# Patient Record
Sex: Male | Born: 1935 | Race: White | Hispanic: No | Smoking: Never smoker
Health system: Southern US, Community
[De-identification: ages and names within clinical notes are randomized; demographics above are authoritative.]

## PROBLEM LIST (undated history)

## (undated) DIAGNOSIS — I10 Essential (primary) hypertension: Secondary | ICD-10-CM

## (undated) DIAGNOSIS — E785 Hyperlipidemia, unspecified: Secondary | ICD-10-CM

## (undated) DIAGNOSIS — T7840XA Allergy, unspecified, initial encounter: Secondary | ICD-10-CM

## (undated) DIAGNOSIS — E079 Disorder of thyroid, unspecified: Secondary | ICD-10-CM

## (undated) HISTORY — DX: Essential (primary) hypertension: I10

## (undated) HISTORY — DX: Hyperlipidemia, unspecified: E78.5

## (undated) HISTORY — DX: Disorder of thyroid, unspecified: E07.9

## (undated) HISTORY — DX: Allergy, unspecified, initial encounter: T78.40XA

## (undated) HISTORY — PX: INGUINAL HERNIA REPAIR: SUR1180

---

## 1997-12-23 ENCOUNTER — Ambulatory Visit (HOSPITAL_COMMUNITY): Admission: RE | Admit: 1997-12-23 | Discharge: 1997-12-23 | Payer: Self-pay | Admitting: Urology

## 1998-03-26 ENCOUNTER — Encounter: Payer: Self-pay | Admitting: Family Medicine

## 1998-03-26 ENCOUNTER — Ambulatory Visit (HOSPITAL_COMMUNITY): Admission: RE | Admit: 1998-03-26 | Discharge: 1998-03-26 | Payer: Self-pay | Admitting: Family Medicine

## 2000-07-15 ENCOUNTER — Encounter: Payer: Self-pay | Admitting: Internal Medicine

## 2000-07-15 ENCOUNTER — Encounter: Admission: RE | Admit: 2000-07-15 | Discharge: 2000-07-15 | Payer: Self-pay | Admitting: Internal Medicine

## 2003-04-09 ENCOUNTER — Emergency Department (HOSPITAL_COMMUNITY): Admission: EM | Admit: 2003-04-09 | Discharge: 2003-04-09 | Payer: Self-pay | Admitting: Emergency Medicine

## 2003-05-29 ENCOUNTER — Encounter: Admission: RE | Admit: 2003-05-29 | Discharge: 2003-05-29 | Payer: Self-pay | Admitting: Family Medicine

## 2003-07-05 ENCOUNTER — Encounter: Admission: RE | Admit: 2003-07-05 | Discharge: 2003-07-30 | Payer: Self-pay | Admitting: Neurology

## 2003-07-22 ENCOUNTER — Ambulatory Visit (HOSPITAL_COMMUNITY): Admission: RE | Admit: 2003-07-22 | Discharge: 2003-07-22 | Payer: Self-pay | Admitting: Neurology

## 2003-08-20 ENCOUNTER — Encounter: Admission: RE | Admit: 2003-08-20 | Discharge: 2003-11-18 | Payer: Self-pay | Admitting: Neurology

## 2010-06-27 ENCOUNTER — Encounter: Payer: Self-pay | Admitting: Family Medicine

## 2010-06-27 ENCOUNTER — Encounter: Payer: Self-pay | Admitting: Neurology

## 2011-09-01 ENCOUNTER — Other Ambulatory Visit: Payer: Self-pay

## 2011-09-01 MED ORDER — FLUTICASONE PROPIONATE 50 MCG/ACT NA SUSP: 2.0000 | Freq: Every day | NASAL | Status: AC

## 2012-10-22 ENCOUNTER — Other Ambulatory Visit: Payer: Self-pay | Admitting: Internal Medicine

## 2016-06-26 ENCOUNTER — Telehealth: Payer: Self-pay

## 2016-06-26 ENCOUNTER — Ambulatory Visit (INDEPENDENT_AMBULATORY_CARE_PROVIDER_SITE_OTHER): Payer: Medicare Other | Admitting: Family Medicine

## 2016-06-26 VITALS — BP 140/70 | HR 77 | Temp 97.9°F | Resp 18 | Ht 65.5 in | Wt 135.2 lb

## 2016-06-26 DIAGNOSIS — J069 Acute upper respiratory infection, unspecified: Secondary | ICD-10-CM

## 2016-06-26 MED ORDER — AMOXICILLIN 500 MG PO CAPS: 500.0000 mg | ORAL_CAPSULE | Freq: Three times a day (TID) | ORAL | 0 refills | Status: DC

## 2016-06-26 NOTE — Progress Notes (Signed)
   SUBJECTIVE: URI symptoms:  Jeremy Merritt is a 82 y.o. male who complains of URI symptoms present for past week or so.   Describes rhinorrhea, sinus congestion, mild cough.  Has tried OTC decongestants and flonase without relief.  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  No fevers or chills. No nausea or vomiting.  Denies smoking cigarettes.  ROS as above.    PMH reviewed. Patient is a nonsmoker.   Medications reviewed.  Physical Exam:  BP 140/70   Pulse 77   Temp 97.9 F (36.6 C) (Oral)   Resp 18   Ht 5' 5.5" (1.664 m)   Wt 135 lb 4 oz (61.3 kg)   SpO2 99%   BMI 22.16 kg/m  Gen:  Patient sitting on exam table, appears stated age in no acute distress Head: Normocephalic atraumatic Eyes: EOMI, PERRL, sclera and conjunctiva non-erythematous.  Mild arcus senilis noted BL. Ears:  Canals clear bilaterally.  TMs pearly gray bilaterally without erythema or bulging.   Nose:  Nasal turbinates grossly enlarged bilaterally. Some exudates noted. Tender to palpation of ethmoidal sinuses. Mouth: Mucosa membranes moist. Tonsils +2, nonenlarged, non-erythematous. Neck: No cervical lymphadenopathy noted Heart:  RRR, no murmurs auscultated. Pulm:  Clear to auscultation bilaterally with good air movement.  No wheezes or rales noted.    Assessment and Plan:  1.  Sinus infection:  - Likely viral URI based on symptoms.   - delayed use of abx. Amoxicillin has helped him more in past than azithro.  - FU if no improvement in next week

## 2016-06-26 NOTE — Patient Instructions (Addendum)
  You do have a sinus infection.  This is likely caused by a cold that's going around.    You will likely start feeling better in a day or two. However, if you're not feeling better by that point, you can start the Amoxicillin.  I have sent this in for you.  If you're feeling worse, come back and see us.    It was good to meet you today   IF you received an x-ray today, you will receive an invoice from [facility]. Please contact [facility] at [phone] with questions or concerns regarding your invoice.   IF you received labwork today, you will receive an invoice from [facility]. Please contact [facility] at [phone] with questions or concerns regarding your invoice.   Our billing staff will not be able to assist you with questions regarding bills from these companies.  You will be contacted with the lab results as soon as they are available. The fastest way to get your results is to activate your My Chart account. Instructions are located on the last page of this paperwork. If you have not heard from us regarding the results in 2 weeks, please contact this office.

## 2016-07-02 ENCOUNTER — Other Ambulatory Visit: Payer: Self-pay | Admitting: Family Medicine

## 2016-08-21 ENCOUNTER — Ambulatory Visit (INDEPENDENT_AMBULATORY_CARE_PROVIDER_SITE_OTHER): Payer: Medicare Other | Admitting: Family Medicine

## 2016-08-21 VITALS — BP 138/80 | HR 99 | Temp 98.2°F | Resp 14 | Ht 65.5 in | Wt 138.0 lb

## 2016-08-21 DIAGNOSIS — J0100 Acute maxillary sinusitis, unspecified: Secondary | ICD-10-CM

## 2016-08-21 DIAGNOSIS — I10 Essential (primary) hypertension: Secondary | ICD-10-CM

## 2016-08-21 MED ORDER — AZITHROMYCIN 250 MG PO TABS: ORAL_TABLET | ORAL | 0 refills | Status: DC

## 2016-08-21 MED ORDER — IPRATROPIUM BROMIDE 0.03 % NA SOLN: 2.0000 | Freq: Two times a day (BID) | NASAL | 0 refills | Status: DC

## 2016-08-21 MED ORDER — CETIRIZINE HCL 10 MG PO TABS: 5.0000 mg | ORAL_TABLET | Freq: Every day | ORAL | 3 refills | Status: DC

## 2016-08-21 NOTE — Patient Instructions (Addendum)
Start Azithromycin Take 2 tabs x 1 dose, then 1 tab every day for x 4 days.  Take Cetirizine (Zyrtec)  5 mg (break tablet in half) or 10 mg if tolerated nightly at bedtime to prevent persisted congestion and nasal drainage.   Ipratropium (Atrovent) 2 sprays, twice daily as needed for nasal congestion. Once congestion resolves, you can stop using daily.  IF you received an x-ray today, you will receive an invoice from [facility]. Please contact [facility] at [phone] with questions or concerns regarding your invoice.   IF you received labwork today, you will receive an invoice from [place]. Please contact [facility] at [phone] with questions or concerns regarding your invoice.   Our billing staff will not be able to assist you with questions regarding bills from these companies.  You will be contacted with the lab results as soon as they are available. The fastest way to get your results is to activate your My Chart account. Instructions are located on the last page of this paperwork. If you have not heard from [place] regarding the results in 2 weeks, please contact this office.     Sinusitis, Adult Sinusitis is soreness and inflammation of your sinuses. Sinuses are hollow spaces in the bones around your face. They are located:  Around your eyes.  In the middle of your forehead.  Behind your nose.  In your cheekbones. Your sinuses and nasal passages are lined with a stringy fluid (mucus). Mucus normally drains out of your sinuses. When your nasal tissues get inflamed or swollen, the mucus can get trapped or blocked so air cannot flow through your sinuses. This lets bacteria, viruses, and funguses grow, and that leads to infection. Follow these instructions at home: Medicines   Take, use, or apply over-the-counter and prescription medicines only as told by your doctor. These may include nasal sprays.  If you were prescribed an antibiotic medicine, take it as told  by your doctor. Do not stop taking the antibiotic even if you start to feel better. Hydrate and Humidify   Drink enough water to keep your pee (urine) clear or pale yellow.  Use a cool mist humidifier to keep the humidity level in your home above 50%.  Breathe in steam for 10-15 minutes, 3-4 times a day or as told by your doctor. You can do this in the bathroom while a hot shower is running.  Try not to spend time in cool or dry air. Rest   Rest as much as possible.  Sleep with your head raised (elevated).  Make sure to get enough sleep each night. General instructions   Put a warm, moist washcloth on your face 3-4 times a day or as told by your doctor. This will help with discomfort.  Wash your hands often with soap and water. If there is no soap and water, use hand sanitizer.  Do not smoke. Avoid being around people who are smoking (secondhand smoke).  Keep all follow-up visits as told by your doctor. This is important. Contact a doctor if:  You have a fever.  Your symptoms get worse.  Your symptoms do not get better within 10 days. Get help right away if:  You have a very bad headache.  You cannot stop throwing up (vomiting).  You have pain or swelling around your face or eyes.  You have trouble seeing.  You feel confused.  Your neck is stiff.  You have trouble breathing. This information is not intended to replace advice given  to you by your health care provider. Make sure you discuss any questions you have with your health care provider. Document Released: 11/10/2007 Document Revised: 01/18/2016 Document Reviewed: 03/19/2015 Elsevier Interactive Patient Education  2017 [employer]

## 2016-08-21 NOTE — Progress Notes (Signed)
Patient ID: Jeremy Merritt, male    DOB: [birth year], 82 y.o.   MRN: [id number]  PCP: No primary care provider on file.  Chief Complaint  Patient presents with  . URI    3-4 days  . Depression    positive depression screen     Subjective:  HPI Jeremy Merritt, 82 year old male presents for evaluation of upper respiratory infection and positive depression screen during triage. Chronic problems include: hypertension, hypothyroidism, and hyperlipidemia. URI Patient reports 1 week of nasal drainage, coughing and throat discomfort. Over the last 3-4 days, he has developed increased pressure behind both eyes, bilateral ear congestion, and mild headache. He has taken Mucinex and Allegra without relief of symptoms. Reports no measurable fever, although last night he felt feverish.  Depression Patient admits to feeling "down". He reports that his wife of more than 50 years passed away 2 years ago. He reports that his sadness doesn't impact his activities. He works at [employer] and lives with his son. He admits that during winter months he has noticed his sadness is worsened. He denies suicidal thoughts or feelings of hopelessness. He feels that he will never get over the loss of his wife as he reports she was his "best friend".  Social History   Social History  . Marital status: Married    Spouse name: N/A  . Number of children: N/A  . Years of education: N/A   Occupational History  . Not on file.   Social History Main Topics  . Smoking status: Never Smoker  . Smokeless tobacco: Never Used  . Alcohol use No  . Drug use: Unknown  . Sexual activity: Not on file   Other Topics Concern  . Not on file   Social History Narrative  . No narrative on file    No family history on file.   Review of Systems See HPI   Prior to Admission medications   Medication Sig Start Date End Date Taking? Authorizing Provider  aspirin EC 81 MG tablet Take 81 mg by mouth  daily.   Yes Historical Provider, MD  Cholecalciferol (VITAMIN D3) 2000 units TABS Take by mouth daily.   Yes Historical Provider, MD  levothyroxine (SYNTHROID, LEVOTHROID) 100 MCG tablet Take 100 mcg by mouth daily before breakfast.   Yes Historical Provider, MD  lisinopril-hydrochlorothiazide (PRINZIDE,ZESTORETIC) 20-12.5 MG tablet Take 1 tablet by mouth daily.   Yes Historical Provider, MD  simvastatin (ZOCOR) 20 MG tablet Take 20 mg by mouth daily.   Yes Historical Provider, MD    Past Medical, Surgical Family and Social History reviewed and updated.    Objective:   Today's Vitals   08/21/16 1215  BP: 138/80  Pulse: 99  Resp: 14  Temp: 98.2 F (36.8 C)  SpO2: 99%  Weight: 138 lb (62.6 kg)  Height: 5' 5.5" (1.664 m)    Wt Readings from Last 3 Encounters:  08/21/16 138 lb (62.6 kg)  06/26/16 135 lb 4 oz (61.3 kg)   Depression screen [facility] 2/9 08/21/2016 06/26/2016  Decreased Interest 0 0  Down, Depressed, Hopeless 1 0  PHQ - 2 Score 1 0   Physical Exam  Constitutional: He is oriented to person, place, and time. He appears well-developed and well-nourished. He has a sickly appearance.  HENT:  Right Ear: Hearing, tympanic membrane, external ear and ear canal normal.  Left Ear: Hearing, tympanic membrane, external ear and ear canal normal.  Nose: Mucosal edema and rhinorrhea present.  Mouth/Throat:  Uvula is midline. Posterior oropharyngeal erythema present. No oropharyngeal exudate or posterior oropharyngeal edema.  Neurological: He is alert and oriented to person, place, and time.  Skin: Skin is warm and dry.  Psychiatric: He has a normal mood and affect. His behavior is normal. Judgment and thought content normal.     Assessment & Plan:  1. Acute maxillary sinusitis, recurrence not specified 2. Essential Hypertension, Controlled  Plan:  Start Azithromycin Take 2 tabs x 1 dose, then 1 tab every day for x 4 days. Take Cetirizine (Zyrtec)  5 mg (break tablet in half) or 10  mg if tolerated nightly at bedtime to prevent persisted congestion and nasal drainage.  Ipratropium (Atrovent) 2 sprays, twice daily as needed for nasal congestion. Once congestion resolves, you can stop using daily. Continue antihypertensive medication previously prescribed.   Jeremy PickKimberly S. Tiburcio PeaHarris, MSN, FNP-C [facility] at [facility] [phone]

## 2016-10-05 ENCOUNTER — Other Ambulatory Visit: Payer: Self-pay

## 2016-10-05 MED ORDER — CETIRIZINE HCL 10 MG PO TABS: 5.0000 mg | ORAL_TABLET | Freq: Every day | ORAL | 3 refills | Status: DC

## 2016-10-14 ENCOUNTER — Other Ambulatory Visit: Payer: Self-pay | Admitting: Emergency Medicine

## 2016-10-14 MED ORDER — CETIRIZINE HCL 10 MG PO TABS: 5.0000 mg | ORAL_TABLET | Freq: Every day | ORAL | 0 refills | Status: DC

## 2017-01-17 ENCOUNTER — Other Ambulatory Visit: Payer: Self-pay | Admitting: Physician Assistant

## 2017-03-15 ENCOUNTER — Ambulatory Visit (INDEPENDENT_AMBULATORY_CARE_PROVIDER_SITE_OTHER): Payer: Medicare Other | Admitting: Emergency Medicine

## 2017-03-15 ENCOUNTER — Encounter: Payer: Self-pay | Admitting: Emergency Medicine

## 2017-03-15 VITALS — BP 151/75 | HR 87 | Temp 99.4°F | Resp 17 | Ht 66.0 in | Wt 136.0 lb

## 2017-03-15 DIAGNOSIS — R0981 Nasal congestion: Secondary | ICD-10-CM

## 2017-03-15 DIAGNOSIS — J3489 Other specified disorders of nose and nasal sinuses: Secondary | ICD-10-CM

## 2017-03-15 DIAGNOSIS — J0100 Acute maxillary sinusitis, unspecified: Secondary | ICD-10-CM

## 2017-03-15 DIAGNOSIS — J069 Acute upper respiratory infection, unspecified: Secondary | ICD-10-CM

## 2017-03-15 MED ORDER — AZITHROMYCIN 250 MG PO TABS: ORAL_TABLET | ORAL | 0 refills | Status: DC

## 2017-03-15 NOTE — Progress Notes (Signed)
Jeremy Merritt 82 y.o.   Chief Complaint  Patient presents with  . Headache  . URI    HISTORY OF PRESENT ILLNESS: This is a 82 y.o. male complaining of URI symptoms x 1 week.  URI   This is a new problem. The current episode started in the past 7 days. The problem has been gradually worsening. There has been no fever. Associated symptoms include congestion, coughing, headaches and sinus pain. Pertinent negatives include no abdominal pain, chest pain, diarrhea, dysuria, ear pain, joint pain, nausea, rash, rhinorrhea, sore throat, vomiting or wheezing.     Prior to Admission medications   Medication Sig Start Date End Date Taking? Authorizing Provider  aspirin EC 81 MG tablet Take 81 mg by mouth daily.   Yes [provider]  azithromycin (ZITHROMAX) 250 MG tablet Take 2 tabs PO x 1 dose, then 1 tab PO QD x 4 days 08/21/16  Yes Bing Neighbors, FNP  cetirizine (ZYRTEC) 10 MG tablet Take 0.5-1 tablets (5-10 mg total) by mouth at bedtime. May stop taking daily after 6 weeks. 10/14/16  Yes Benjiman Core D, PA-C  Cholecalciferol (VITAMIN D3) 2000 units TABS Take by mouth daily.   Yes [provider]  ipratropium (ATROVENT) 0.03 % nasal spray Place 2 sprays into both nostrils 2 (two) times daily. 08/21/16  Yes Bing Neighbors, FNP  levothyroxine (SYNTHROID, LEVOTHROID) 100 MCG tablet Take 100 mcg by mouth daily before breakfast.   Yes [provider]  lisinopril-hydrochlorothiazide (PRINZIDE,ZESTORETIC) 20-12.5 MG tablet Take 1 tablet by mouth daily.   Yes [provider]  simvastatin (ZOCOR) 20 MG tablet Take 20 mg by mouth daily.   Yes [provider]    No Known Allergies  There are no active problems to display for this patient.   Past Medical History:  Diagnosis Date  . Allergy     No past surgical history on file.  Social History   Social History  . Marital status: Married    Spouse name: N/A  . Number of children:  N/A  . Years of education: N/A   Occupational History  . Not on file.   Social History Main Topics  . Smoking status: Never Smoker  . Smokeless tobacco: Never Used  . Alcohol use No  . Drug use: No  . Sexual activity: No   Other Topics Concern  . Not on file   Social History Narrative  . No narrative on file    No family history on file.   Review of Systems  Constitutional: Negative for chills, fever and malaise/fatigue.  HENT: Positive for congestion and sinus pain. Negative for ear pain, nosebleeds, rhinorrhea and sore throat.   Eyes: Negative.  Negative for blurred vision, double vision, discharge and redness.  Respiratory: Positive for cough and sputum production. Negative for shortness of breath and wheezing.   Cardiovascular: Negative for chest pain and palpitations.  Gastrointestinal: Negative for abdominal pain, diarrhea, nausea and vomiting.  Genitourinary: Negative for dysuria and hematuria.  Musculoskeletal: Negative for joint pain.  Skin: Negative for rash.  Neurological: Positive for headaches.  Endo/Heme/Allergies: Negative.   All other systems reviewed and are negative.  Vitals:   03/15/17 1145  BP: (!) 151/75  Pulse: 87  Resp: 17  Temp: 99.4 F (37.4 C)  SpO2: 98%     Physical Exam  Constitutional: He is oriented to person, place, and time. He appears well-developed and well-nourished.  HENT:  Head: Normocephalic and atraumatic.  Nose:  Nose normal.  Mouth/Throat: Oropharynx is clear and moist.  Eyes: Pupils are equal, round, and reactive to light. Conjunctivae and EOM are normal.  Neck: Normal range of motion. Neck supple. No JVD present. No thyromegaly present.  Cardiovascular: Normal rate, regular rhythm, normal heart sounds and intact distal pulses.   Pulmonary/Chest: Effort normal and breath sounds normal.  Abdominal: Soft. He exhibits no distension. There is no tenderness.  Musculoskeletal: Normal range of motion.  Lymphadenopathy:     He has no cervical adenopathy.  Neurological: He is alert and oriented to person, place, and time.  Skin: Skin is warm and dry. Capillary refill takes less than 2 seconds. No rash noted.  Psychiatric: He has a normal mood and affect. His behavior is normal.  Vitals reviewed.    ASSESSMENT & PLAN: Jeremy Merritt was seen today for headache and uri.  Diagnoses and all orders for this visit:  Acute maxillary sinusitis, recurrence not specified  Acute upper respiratory infection  Sinus congestion  Sinus pressure  Other orders -     azithromycin (ZITHROMAX) 250 MG tablet; Sig as indicated    Patient Instructions       IF you received an x-ray today, you will receive an invoice from [facility]. Please contact [facility] at [phone] with questions or concerns regarding your invoice.   IF you received labwork today, you will receive an invoice from [place]. Please contact [facility] at [phone] with questions or concerns regarding your invoice.   Our billing staff will not be able to assist you with questions regarding bills from these companies.  You will be contacted with the lab results as soon as they are available. The fastest way to get your results is to activate your My Chart account. Instructions are located on the last page of this paperwork. If you have not heard from [place] regarding the results in 2 weeks, please contact this office.      Upper Respiratory Infection, Adult Most upper respiratory infections (URIs) are caused by a virus. A URI affects the nose, throat, and upper air passages. The most common type of URI is often called "the common cold." Follow these instructions at home:  Take medicines only as told by your doctor.  Gargle warm saltwater or take cough drops to comfort your throat as told by your doctor.  Use a warm mist humidifier or inhale steam from a shower to increase air moisture. This may make it easier to breathe.  Drink  enough fluid to keep your pee (urine) clear or pale yellow.  Eat soups and other clear broths.  Have a healthy diet.  Rest as needed.  Go back to work when your fever is gone or your doctor says it is okay. ? You may need to stay home longer to avoid giving your URI to others. ? You can also wear a face mask and wash your hands often to prevent spread of the virus.  Use your inhaler more if you have asthma.  Do not use any tobacco products, including cigarettes, chewing tobacco, or electronic cigarettes. If you need help quitting, ask your doctor. Contact a doctor if:  You are getting worse, not better.  Your symptoms are not helped by medicine.  You have chills.  You are getting more short of breath.  You have brown or red mucus.  You have yellow or brown discharge from your nose.  You have pain in your face, especially when you bend forward.  You have a fever.  You have puffy (swollen) neck glands.  You have pain while swallowing.  You have white areas in the back of your throat. Get help right away if:  You have very bad or constant: ? Headache. ? Ear pain. ? Pain in your forehead, behind your eyes, and over your cheekbones (sinus pain). ? Chest pain.  You have long-lasting (chronic) lung disease and any of the following: ? Wheezing. ? Long-lasting cough. ? Coughing up blood. ? A change in your usual mucus.  You have a stiff neck.  You have changes in your: ? Vision. ? Hearing. ? Thinking. ? Mood. This information is not intended to replace advice given to you by your health care provider. Make sure you discuss any questions you have with your health care provider. Document Released: 11/10/2007 Document Revised: 01/25/2016 Document Reviewed: 08/29/2013 Elsevier Interactive Patient Education  2018 Elsevier Inc.      Agustina Caroli, MD [facility]

## 2017-03-15 NOTE — Patient Instructions (Addendum)
     IF you received an x-ray today, you will receive an invoice from [facility]. Please contact [facility] at [phone] with questions or concerns regarding your invoice.   IF you received labwork today, you will receive an invoice from [facility]. Please contact [facility] at [phone] with questions or concerns regarding your invoice.   Our billing staff will not be able to assist you with questions regarding bills from these companies.  You will be contacted with the lab results as soon as they are available. The fastest way to get your results is to activate your My Chart account. Instructions are located on the last page of this paperwork. If you have not heard from us regarding the results in 2 weeks, please contact this office.     Upper Respiratory Infection, Adult Most upper respiratory infections (URIs) are caused by a virus. A URI affects the nose, throat, and upper air passages. The most common type of URI is often called "the common cold." Follow these instructions at home:  Take medicines only as told by your doctor.  Gargle warm saltwater or take cough drops to comfort your throat as told by your doctor.  Use a warm mist humidifier or inhale steam from a shower to increase air moisture. This may make it easier to breathe.  Drink enough fluid to keep your pee (urine) clear or pale yellow.  Eat soups and other clear broths.  Have a healthy diet.  Rest as needed.  Go back to work when your fever is gone or your doctor says it is okay. ? You may need to stay home longer to avoid giving your URI to others. ? You can also wear a face mask and wash your hands often to prevent spread of the virus.  Use your inhaler more if you have asthma.  Do not use any tobacco products, including cigarettes, chewing tobacco, or electronic cigarettes. If you need help quitting, ask your doctor. Contact a doctor if:  You are getting worse, not better.  Your  symptoms are not helped by medicine.  You have chills.  You are getting more short of breath.  You have brown or red mucus.  You have yellow or brown discharge from your nose.  You have pain in your face, especially when you bend forward.  You have a fever.  You have puffy (swollen) neck glands.  You have pain while swallowing.  You have white areas in the back of your throat. Get help right away if:  You have very bad or constant: ? Headache. ? Ear pain. ? Pain in your forehead, behind your eyes, and over your cheekbones (sinus pain). ? Chest pain.  You have long-lasting (chronic) lung disease and any of the following: ? Wheezing. ? Long-lasting cough. ? Coughing up blood. ? A change in your usual mucus.  You have a stiff neck.  You have changes in your: ? Vision. ? Hearing. ? Thinking. ? Mood. This information is not intended to replace advice given to you by your health care provider. Make sure you discuss any questions you have with your health care provider. Document Released: 11/10/2007 Document Revised: 01/25/2016 Document Reviewed: 08/29/2013 Elsevier Interactive Patient Education  2018 [employer] Inc.  

## 2017-10-04 ENCOUNTER — Ambulatory Visit: Payer: Medicare Other | Admitting: Emergency Medicine

## 2017-10-05 ENCOUNTER — Ambulatory Visit: Payer: Medicare Other | Admitting: Physician Assistant

## 2018-06-15 ENCOUNTER — Encounter: Payer: Self-pay | Admitting: Emergency Medicine

## 2018-06-15 ENCOUNTER — Ambulatory Visit: Payer: Medicare Other

## 2018-06-15 ENCOUNTER — Ambulatory Visit (INDEPENDENT_AMBULATORY_CARE_PROVIDER_SITE_OTHER): Payer: Medicare Other | Admitting: Emergency Medicine

## 2018-06-15 ENCOUNTER — Other Ambulatory Visit: Payer: Self-pay

## 2018-06-15 ENCOUNTER — Ambulatory Visit (INDEPENDENT_AMBULATORY_CARE_PROVIDER_SITE_OTHER): Payer: Medicare Other

## 2018-06-15 VITALS — BP 115/70 | HR 85 | Temp 98.6°F | Resp 16 | Ht 65.0 in | Wt 120.0 lb

## 2018-06-15 DIAGNOSIS — R6881 Early satiety: Secondary | ICD-10-CM

## 2018-06-15 DIAGNOSIS — R198 Other specified symptoms and signs involving the digestive system and abdomen: Secondary | ICD-10-CM

## 2018-06-15 DIAGNOSIS — R1010 Upper abdominal pain, unspecified: Secondary | ICD-10-CM

## 2018-06-15 DIAGNOSIS — K5900 Constipation, unspecified: Secondary | ICD-10-CM | POA: Insufficient documentation

## 2018-06-15 LAB — POCT URINALYSIS DIP (MANUAL ENTRY)
Bilirubin, UA: NEGATIVE
Blood, UA: NEGATIVE
Glucose, UA: NEGATIVE mg/dL
Ketones, POC UA: NEGATIVE mg/dL
Leukocytes, UA: NEGATIVE
Nitrite, UA: NEGATIVE
Protein Ur, POC: NEGATIVE mg/dL
Spec Grav, UA: 1.02 (ref 1.010–1.025)
Urobilinogen, UA: 0.2 E.U./dL
pH, UA: 6 (ref 5.0–8.0)

## 2018-06-15 LAB — POCT CBC
Granulocyte percent: 62.2 %G (ref 37–80)
HCT, POC: 41.1 % — AB (ref 29–41)
Hemoglobin: 14.4 g/dL (ref 11–14.6)
Lymph, poc: 2.2 (ref 0.6–3.4)
MCH, POC: 31.1 pg (ref 27–31.2)
MCHC: 34.9 g/dL (ref 31.8–35.4)
MCV: 89 fL (ref 76–111)
MID (cbc): 0.5 (ref 0–0.9)
MPV: 6.1 fL (ref 0–99.8)
POC Granulocyte: 4.5 (ref 2–6.9)
POC LYMPH PERCENT: 30.4 %L (ref 10–50)
POC MID %: 7.4 %M (ref 0–12)
Platelet Count, POC: 286 10*3/uL (ref 142–424)
RBC: 4.62 M/uL — AB (ref 4.69–6.13)
RDW, POC: 12.8 %
WBC: 7.3 10*3/uL (ref 4.6–10.2)

## 2018-06-15 MED ORDER — OMEPRAZOLE 40 MG PO CPDR: 40.0000 mg | DELAYED_RELEASE_CAPSULE | Freq: Every day | ORAL | 3 refills | Status: DC

## 2018-06-15 NOTE — Patient Instructions (Addendum)
     If you have lab work done today you will be contacted with your lab results within the next 2 weeks.  If you have not heard from us then please contact us. The fastest way to get your results is to register for My Chart.   IF you received an x-ray today, you will receive an invoice from [facility]. Please contact [facility] at [phone] with questions or concerns regarding your invoice.   IF you received labwork today, you will receive an invoice from [facility]. Please contact [facility] at [phone] with questions or concerns regarding your invoice.   Our billing staff will not be able to assist you with questions regarding bills from these companies.  You will be contacted with the lab results as soon as they are available. The fastest way to get your results is to activate your My Chart account. Instructions are located on the last page of this paperwork. If you have not heard from us regarding the results in 2 weeks, please contact this office.      Abdominal Pain, Adult  Many things can cause belly (abdominal) pain. Most times, belly pain is not dangerous. Many cases of belly pain can be watched and treated at home. Sometimes belly pain is serious, though. Your doctor will try to find the cause of your belly pain. Follow these instructions at home:  Take over-the-counter and prescription medicines only as told by your doctor. Do not take medicines that help you poop (laxatives) unless told to by your doctor.  Drink enough fluid to keep your pee (urine) clear or pale yellow.  Watch your belly pain for any changes.  Keep all follow-up visits as told by your doctor. This is important. Contact a doctor if:  Your belly pain changes or gets worse.  You are not hungry, or you lose weight without trying.  You are having trouble pooping (constipated) or have watery poop (diarrhea) for more than 2-3 days.  You have pain when you pee or poop.  Your belly  pain wakes you up at night.  Your pain gets worse with meals, after eating, or with certain foods.  You are throwing up and cannot keep anything down.  You have a fever. Get help right away if:  Your pain does not go away as soon as your doctor says it should.  You cannot stop throwing up.  Your pain is only in areas of your belly, such as the right side or the left lower part of the belly.  You have bloody or black poop, or poop that looks like tar.  You have very bad pain, cramping, or bloating in your belly.  You have signs of not having enough fluid or water in your body (dehydration), such as: ? Dark pee, very little pee, or no pee. ? Cracked lips. ? Dry mouth. ? Sunken eyes. ? Sleepiness. ? Weakness. This information is not intended to replace advice given to you by your health care provider. Make sure you discuss any questions you have with your health care provider. Document Released: 11/10/2007 Document Revised: 12/12/2015 Document Reviewed: 11/05/2015 Elsevier Interactive Patient Education  2019 Elsevier Inc.  

## 2018-06-15 NOTE — Progress Notes (Signed)
Jeremy Merritt 84 y.o.   Chief Complaint  Patient presents with  . Abdominal Pain    with constipation x 1 month unable to eat - per patient off/on start in the evening    HISTORY OF PRESENT ILLNESS: This is a 84 y.o. male complaining of upper abdominal fullness sensation mostly in the evening for the past 2 to 3 months along with constipation for the past month.  At times it feels like an "irritation" in the stomach, relieved by Tums.  Denies nausea or vomiting.  Denies melena or rectal bleeding.  Denies any new medication or changes in his regular diet.  HPI   Prior to Admission medications   Medication Sig Start Date End Date Taking? Authorizing Provider  aspirin EC 81 MG tablet Take 81 mg by mouth daily.   Yes [provider]  cetirizine (ZYRTEC) 10 MG tablet Take 0.5-1 tablets (5-10 mg total) by mouth at bedtime. May stop taking daily after 6 weeks. 10/14/16  Yes Benjiman Core D, PA-C  Cholecalciferol (VITAMIN D3) 2000 units TABS Take by mouth daily.   Yes [provider]  levothyroxine (SYNTHROID, LEVOTHROID) 100 MCG tablet Take 100 mcg by mouth daily before breakfast.   Yes [provider]  lisinopril-hydrochlorothiazide (PRINZIDE,ZESTORETIC) 20-12.5 MG tablet Take 1 tablet by mouth daily.   Yes [provider]  simvastatin (ZOCOR) 20 MG tablet Take 20 mg by mouth daily.   Yes [provider]  ipratropium (ATROVENT) 0.03 % nasal spray Place 2 sprays into both nostrils 2 (two) times daily. Patient not taking: Reported on 06/15/2018 08/21/16   Bing Neighbors, FNP    No Known Allergies  There are no active problems to display for this patient.   Past Medical History:  Diagnosis Date  . Allergy     No past surgical history on file.  Social History   Socioeconomic History  . Marital status: Married    Spouse name: Not on file  . Number of children: Not on file  . Years of education: Not on file  . Highest  education level: Not on file  Occupational History  . Not on file  Social Needs  . Financial resource strain: Not on file  . Food insecurity:    Worry: Not on file    Inability: Not on file  . Transportation needs:    Medical: Not on file    Non-medical: Not on file  Tobacco Use  . Smoking status: Never Smoker  . Smokeless tobacco: Never Used  Substance and Sexual Activity  . Alcohol use: No  . Drug use: No  . Sexual activity: Never  Lifestyle  . Physical activity:    Days per week: Not on file    Minutes per session: Not on file  . Stress: Not on file  Relationships  . Social connections:    Talks on phone: Not on file    Gets together: Not on file    Attends religious service: Not on file    Active member of club or organization: Not on file    Attends meetings of clubs or organizations: Not on file    Relationship status: Not on file  . Intimate partner violence:    Fear of current or ex partner: Not on file    Emotionally abused: Not on file    Physically abused: Not on file    Forced sexual activity: Not on file  Other Topics Concern  . Not on file  Social  History Narrative  . Not on file    No family history on file.   Review of Systems  Constitutional: Negative.  Negative for chills and fever.  HENT: Negative.   Eyes: Negative.   Respiratory: Negative.  Negative for cough and shortness of breath.   Cardiovascular: Negative.  Negative for chest pain and palpitations.  Gastrointestinal: Positive for abdominal pain, constipation and heartburn. Negative for blood in stool, diarrhea, melena, nausea and vomiting.  Genitourinary: Negative.   Skin: Negative.  Negative for rash.  Neurological: Negative.  Negative for dizziness and headaches.  Endo/Heme/Allergies: Negative.   All other systems reviewed and are negative.   Vitals:   06/15/18 1146  BP: 115/70  Pulse: 85  Resp: 16  Temp: 98.6 F (37 C)  SpO2: 100%    Physical Exam Vitals signs reviewed.   Constitutional:      Appearance: He is well-developed.  HENT:     Head: Normocephalic and atraumatic.     Nose: Nose normal.     Mouth/Throat:     Mouth: Mucous membranes are moist.     Pharynx: Oropharynx is clear.  Eyes:     Extraocular Movements: Extraocular movements intact.     Conjunctiva/sclera: Conjunctivae normal.     Pupils: Pupils are equal, round, and reactive to light.  Neck:     Musculoskeletal: Normal range of motion.  Cardiovascular:     Rate and Rhythm: Normal rate and regular rhythm.     Pulses: Normal pulses.     Heart sounds: Normal heart sounds.  Pulmonary:     Effort: Pulmonary effort is normal.     Breath sounds: Normal breath sounds.  Abdominal:     General: Abdomen is flat. Bowel sounds are normal. There is no distension.     Palpations: Abdomen is soft. There is no mass.     Tenderness: There is no abdominal tenderness.  Musculoskeletal: Normal range of motion.  Skin:    General: Skin is warm and dry.     Capillary Refill: Capillary refill takes less than 2 seconds.  Neurological:     General: No focal deficit present.     Mental Status: He is alert and oriented to person, place, and time.  Psychiatric:        Mood and Affect: Mood normal.        Behavior: Behavior normal.    Results for orders placed or performed in visit on 06/15/18 (from the past 24 hour(s))  POCT urinalysis dipstick     Status: None   Collection Time: 06/15/18 12:24 PM  Result Value Ref Range   Color, UA yellow yellow   Clarity, UA clear clear   Glucose, UA negative negative mg/dL   Bilirubin, UA negative negative   Ketones, POC UA negative negative mg/dL   Spec Grav, UA 9.147 8.295 - 1.025   Blood, UA negative negative   pH, UA 6.0 5.0 - 8.0   Protein Ur, POC negative negative mg/dL   Urobilinogen, UA 0.2 0.2 or 1.0 E.U./dL   Nitrite, UA Negative Negative   Leukocytes, UA Negative Negative  POCT CBC     Status: Abnormal   Collection Time: 06/15/18 12:29 PM    Result Value Ref Range   WBC 7.3 4.6 - 10.2 K/uL   Lymph, poc 2.2 0.6 - 3.4   POC LYMPH PERCENT 30.4 10 - 50 %L   MID (cbc) 0.5 0 - 0.9   POC MID % 7.4 0 - 12 %M  POC Granulocyte 4.5 2 - 6.9   Granulocyte percent 62.2 37 - 80 %G   RBC 4.62 (A) 4.69 - 6.13 M/uL   Hemoglobin 14.4 11 - 14.6 g/dL   HCT, POC 50.3 (A) 29 - 41 %   MCV 89.0 76 - 111 fL   MCH, POC 31.1 27 - 31.2 pg   MCHC 34.9 31.8 - 35.4 g/dL   RDW, POC 88.8 %   Platelet Count, POC 286 142 - 424 K/uL   MPV 6.1 0 - 99.8 fL   Dg Chest 1 View  Result Date: 06/15/2018 CLINICAL DATA:  Upper abdominal pain EXAM: CHEST  1 VIEW COMPARISON:  None. FINDINGS: The cardiopericardial silhouette is within normal limits. There is moderate aortic atherosclerosis at the arch without aneurysmal dilatation. Scattered subpleural 2-3 mm nodular opacities are noted in both lung apices and right mid lung, nonspecific but may be postinfectious or inflammatory in etiology. Pulmonary nodules are not entirely excluded. Indeterminate right basilar nodule measuring 5 mm may represent the right nipple shadow. No acute pulmonary consolidation, effusion or pneumothorax. No free air beneath the diaphragm. The visualized skeletal structures are unremarkable. IMPRESSION: 1. 2-3 mm subpleural nodular opacities are noted in both lung apices and right mid lung, nonspecific but may be postinfectious or inflammatory in etiology. Pulmonary nodules are not entirely excluded. Consider baseline chest CT for further correlation. 2. Aortic atherosclerosis. Electronically Signed   By: Tollie Eth M.D.   On: 06/15/2018 14:04   Dg Abd 2 Views  Result Date: 06/15/2018 CLINICAL DATA:  Upper abdominal pain EXAM: ABDOMEN - 2 VIEW COMPARISON:  None. FINDINGS: Supine and erect views of the abdomen show no bowel obstruction. No free air is seen. There is a moderate amount of feces throughout the colon. No definite renal or ureteral calculi are noted. The bones are unremarkable.  IMPRESSION: 1. Moderate amount of feces throughout the colon. No bowel obstruction. 2. No opaque calculi. 3. No renal or ureteral calculi are noted. Electronically Signed   By: Dwyane Dee M.D.   On: 06/15/2018 13:59     ASSESSMENT & PLAN: Jesstin was seen today for abdominal pain.  Diagnoses and all orders for this visit:  Upper abdominal pain -     POCT urinalysis dipstick -     Comprehensive metabolic panel -     POCT CBC -     Cancel: DG ACUTE ABD 2+V ABD (SUPINE,ERECT,DECUB) + 1V CHEST; Future -     Lipase -     Cancel: DG Abd Acute W/Chest; Future -     Ambulatory referral to Gastroenterology -     Cancel: DG Abd Acute W/Chest; Future -     Cancel: DG Abd Acute W/Chest; Future -     Cancel: DG Abd Acute W/Chest; Future -     Cancel: DG Abd 2 Views; Future -     Cancel: DG Chest 1 View; Future -     DG Abd 2 Views; Future -     DG Chest 1 View; Future -     omeprazole (PRILOSEC) 40 MG capsule; Take 1 capsule (40 mg total) by mouth daily.  Early satiety -     Ambulatory referral to Gastroenterology  Abdominal fullness -     Ambulatory referral to Gastroenterology  Constipation, unspecified constipation type -     Cancel: DG Abd 2 Views; Future    Patient Instructions       If you have lab work done today you will  be contacted with your lab results within the next 2 weeks.  If you have not heard from us then please contact us. The fastest way to get your results is to register for My Chart.   IF you received an x-ray today, you will receive an invoice from [facility]. Please contact [facility] at [phone] with questions or concerns regarding your invoice.   IF you received labwork today, you will receive an invoice from [facility]. Please contact [facility] at [phone] with questions or concerns regarding your invoice.   Our billing staff will not be able to assist you with questions regarding bills from these companies.  You will be  contacted with the lab results as soon as they are available. The fastest way to get your results is to activate your My Chart account. Instructions are located on the last page of this paperwork. If you have not heard from us regarding the results in 2 weeks, please contact this office.     Abdominal Pain, Adult  Many things can cause belly (abdominal) pain. Most times, belly pain is not dangerous. Many cases of belly pain can be watched and treated at home. Sometimes belly pain is serious, though. Your doctor will try to find the cause of your belly pain. Follow these instructions at home:  Take over-the-counter and prescription medicines only as told by your doctor. Do not take medicines that help you poop (laxatives) unless told to by your doctor.  Drink enough fluid to keep your pee (urine) clear or pale yellow.  Watch your belly pain for any changes.  Keep all follow-up visits as told by your doctor. This is important. Contact a doctor if:  Your belly pain changes or gets worse.  You are not hungry, or you lose weight without trying.  You are having trouble pooping (constipated) or have watery poop (diarrhea) for more than 2-3 days.  You have pain when you pee or poop.  Your belly pain wakes you up at night.  Your pain gets worse with meals, after eating, or with certain foods.  You are throwing up and cannot keep anything down.  You have a fever. Get help right away if:  Your pain does not go away as soon as your doctor says it should.  You cannot stop throwing up.  Your pain is only in areas of your belly, such as the right side or the left lower part of the belly.  You have bloody or black poop, or poop that looks like tar.  You have very bad pain, cramping, or bloating in your belly.  You have signs of not having enough fluid or water in your body (dehydration), such as: ? Dark pee, very little pee, or no pee. ? Cracked lips. ? Dry mouth. ? Sunken  eyes. ? Sleepiness. ? Weakness. This information is not intended to replace advice given to you by your health care provider. Make sure you discuss any questions you have with your health care provider. Document Released: 11/10/2007 Document Revised: 12/12/2015 Document Reviewed: 11/05/2015 Elsevier Interactive Patient Education  2019 Elsevier Inc.      Edwina BarthMiguel Latacha Texeira, MD [facility]

## 2018-06-16 LAB — COMPREHENSIVE METABOLIC PANEL
ALT: 12 IU/L (ref 0–44)
AST: 13 IU/L (ref 0–40)
Albumin/Globulin Ratio: 2 (ref 1.2–2.2)
Albumin: 4.5 g/dL (ref 3.5–4.7)
Alkaline Phosphatase: 97 IU/L (ref 39–117)
BUN/Creatinine Ratio: 12 (ref 10–24)
BUN: 15 mg/dL (ref 8–27)
Bilirubin Total: 0.4 mg/dL (ref 0.0–1.2)
CO2: 26 mmol/L (ref 20–29)
Calcium: 9.4 mg/dL (ref 8.6–10.2)
Chloride: 101 mmol/L (ref 96–106)
Creatinine, Ser: 1.21 mg/dL (ref 0.76–1.27)
GFR calc Af Amer: 65 mL/min/{1.73_m2} (ref >59)
GFR calc non Af Amer: 56 mL/min/{1.73_m2} — ABNORMAL LOW (ref >59)
Globulin, Total: 2.2 g/dL (ref 1.5–4.5)
Glucose: 90 mg/dL (ref 65–99)
Potassium: 4.4 mmol/L (ref 3.5–5.2)
Sodium: 142 mmol/L (ref 134–144)
Total Protein: 6.7 g/dL (ref 6.0–8.5)

## 2018-06-16 LAB — LIPASE: Lipase: 20 U/L (ref 13–78)

## 2018-06-26 ENCOUNTER — Encounter: Payer: Self-pay | Admitting: Gastroenterology

## 2018-06-28 ENCOUNTER — Telehealth: Payer: Self-pay | Admitting: Emergency Medicine

## 2018-06-28 NOTE — Telephone Encounter (Signed)
Copied from CRM [id number]. Topic: General - Inquiry >> Jun 28, 2018  8:41 AM Maia Petties wrote: Reason for CRM: pt called to f/u on xray results. He received a letter with lab information but nothing about xrays. Please call to advise.

## 2018-06-28 NOTE — Telephone Encounter (Signed)
Please advise 

## 2018-06-28 NOTE — Telephone Encounter (Signed)
Unremarkable x-rays.  Thanks.

## 2018-06-28 NOTE — Telephone Encounter (Signed)
Spoke to pt about about xray and informed him of labs.

## 2018-06-28 NOTE — Telephone Encounter (Signed)
LVM for pt to call office back about labs. 

## 2018-07-17 ENCOUNTER — Encounter: Payer: Self-pay | Admitting: Gastroenterology

## 2018-07-17 ENCOUNTER — Ambulatory Visit: Payer: Medicare Other | Admitting: Gastroenterology

## 2018-07-17 ENCOUNTER — Other Ambulatory Visit (INDEPENDENT_AMBULATORY_CARE_PROVIDER_SITE_OTHER): Payer: Medicare Other

## 2018-07-17 VITALS — BP 138/64 | HR 96 | Ht 65.5 in | Wt 124.0 lb

## 2018-07-17 DIAGNOSIS — R933 Abnormal findings on diagnostic imaging of other parts of digestive tract: Secondary | ICD-10-CM

## 2018-07-17 DIAGNOSIS — R935 Abnormal findings on diagnostic imaging of other abdominal regions, including retroperitoneum: Secondary | ICD-10-CM

## 2018-07-17 DIAGNOSIS — R140 Abdominal distension (gaseous): Secondary | ICD-10-CM | POA: Diagnosis not present

## 2018-07-17 LAB — BASIC METABOLIC PANEL
BUN: 16 mg/dL (ref 6–23)
CO2: 30 mEq/L (ref 19–32)
Calcium: 9.3 mg/dL (ref 8.4–10.5)
Chloride: 103 mEq/L (ref 96–112)
Creatinine, Ser: 1.14 mg/dL (ref 0.40–1.50)
GFR: 61.6 mL/min (ref >60.00)
Glucose, Bld: 90 mg/dL (ref 70–99)
Potassium: 3.7 mEq/L (ref 3.5–5.1)
Sodium: 141 mEq/L (ref 135–145)

## 2018-07-17 LAB — TSH: TSH: 2.55 u[IU]/mL (ref 0.35–4.50)

## 2018-07-17 NOTE — Progress Notes (Signed)
Referring Provider: Georgina QuintSagardia, Miguel Jose, * Primary Care Physician:  Patient, No Pcp Per   Reason for Consultation: Abdominal bloating   IMPRESSION:  Upper abdominal pain and bloating  Recent unintentional weight loss Moderate amount of stool in the colon on abdominal films 06/15/18  Patient declines any endoscopic evaluation.  Therefore, we will proceed with cross-sectional imaging as a starting point to evaluate his symptoms.  PLAN: Addition of Miralax daily to see if symptoms improve with control of intermittent constipation Continue omeprazole 40 mg daily CT abd/pelvis with contrast BMP, TSH Return to clinic after the CT scan  HPI: Hyman BowerKernsie M Corrow is a 84 y.o. male seen in consultation at the request of Dr. Alvy BimlerSagardia.  The history is obtained through the patient and review of his electronic health record.  He notes [facility].  Abdominal discomfort x 3-4 months. Some bubbling; some diffuse, non-radiating pain; some urgency with defecation even when he doesn't need to defecate. Associated nausea.  Feels AN "irritation" in the stomach no distension. +Flatus. No eructation.  Worse at night.  No heartburn. Intermittent diarrhea. No constipation recently, but prior history of constipation.  Some improvement with Tums as needed.  No improvement on Benefiber daily for several weeks.   No change in symptoms with defecation or eating of the time.  May be intermittent relief with defecation.  At least one BM daily but there is a sense of incomplete evacuation.   Originally lost some weight but he has regained it.  Had anorexia, now improved. Believes the protonix may be helping.  Good energy levels. Supplementing with boost over the last month Started Vitamin D replacements.  No other associated symptoms. No identified exacerbating or relieving features.   Some stress. Wife died 05/2014. Son stays with him. Daughter lives in [place].   Labs 06/15/18: normal  CMP. WBC 7.3, hgb 14.4.  Lipase was 20. Abdominal films 06/15/2018 showed a moderate amount of stool throughout the colon  No prior colonoscopy or endoscopy. No known family history of colon cancer or polyps.   Past Medical History:  Diagnosis Date  . Allergy   . Hyperlipidemia   . Hypertension   . Thyroid disease     Past Surgical History:  Procedure Laterality Date  . INGUINAL HERNIA REPAIR      Current Outpatient Medications  Medication Sig Dispense Refill  . aspirin EC 81 MG tablet Take 81 mg by mouth daily.    . cetirizine (ZYRTEC) 10 MG tablet Take 0.5-1 tablets (5-10 mg total) by mouth at bedtime. May stop taking daily after 6 weeks. 90 tablet 0  . Cholecalciferol (VITAMIN D3) 2000 units TABS Take by mouth daily.    Marland Kitchen. levothyroxine (SYNTHROID, LEVOTHROID) 100 MCG tablet Take 100 mcg by mouth daily before breakfast.    . lisinopril-hydrochlorothiazide (PRINZIDE,ZESTORETIC) 20-12.5 MG tablet Take 1 tablet by mouth daily.    Marland Kitchen. omeprazole (PRILOSEC) 40 MG capsule Take 1 capsule (40 mg total) by mouth daily. 30 capsule 3  . simvastatin (ZOCOR) 20 MG tablet Take 20 mg by mouth daily.     No current facility-administered medications for this visit.     Allergies as of 07/17/2018  . (No Known Allergies)    Family History  Problem Relation Age of Onset  . Other Mother        died of old age  . Other Father        Black lung  . Thyroid cancer Sister   .  Kidney failure Brother        on dialysis  . Colon cancer Neg Hx   . Liver cancer Neg Hx   . Stomach cancer Neg Hx     Social History   Socioeconomic History  . Marital status: Widowed    Spouse name: Not on file  . Number of children: 2  . Years of education: Not on file  . Highest education level: Not on file  Occupational History  . Occupation: retired  Engineer, productionocial Needs  . Financial resource strain: Not on file  . Food insecurity:    Worry: Not on file    Inability: Not on file  . Transportation needs:     Medical: Not on file    Non-medical: Not on file  Tobacco Use  . Smoking status: Never Smoker  . Smokeless tobacco: Never Used  Substance and Sexual Activity  . Alcohol use: No  . Drug use: No  . Sexual activity: Never  Lifestyle  . Physical activity:    Days per week: Not on file    Minutes per session: Not on file  . Stress: Not on file  Relationships  . Social connections:    Talks on phone: Not on file    Gets together: Not on file    Attends religious service: Not on file    Active member of club or organization: Not on file    Attends meetings of clubs or organizations: Not on file    Relationship status: Not on file  . Intimate partner violence:    Fear of current or ex partner: Not on file    Emotionally abused: Not on file    Physically abused: Not on file    Forced sexual activity: Not on file  Other Topics Concern  . Not on file  Social History Narrative  . Not on file    Review of Systems: 12 system ROS is negative except as noted above.  Filed Weights   07/17/18 1418  Weight: 124 lb (56.2 kg)    Physical Exam: Vital signs were reviewed. General:   Alert, well-nourished, pleasant and cooperative in NAD. Bilateral temporal wasting.  Head:  Normocephalic and atraumatic. Eyes:  Sclera clear, no icterus.   Conjunctiva pink. Mouth:  No deformity or lesions.   Neck:  Supple; no thyromegaly. Lungs:  Clear throughout to auscultation.   No wheezes.  Heart:  Regular rate and rhythm; no murmurs Abdomen:  Soft, nontender, normal bowel sounds. No rebound or guarding. No hepatosplenomegaly Rectal:  Deferred  Msk:  Symmetrical without gross deformities. Extremities:  No gross deformities or edema. Neurologic:  Alert and  oriented x4;  grossly nonfocal Skin:  No rash or bruise. Psych:  Alert and cooperative. Normal mood and affect.   Dariel Pellecchia L. Orvan FalconerBeavers, MD, MPH [facility] 07/17/2018, 2:50 PM

## 2018-07-17 NOTE — Patient Instructions (Signed)
Your provider has requested that you go to the basement level for lab work before leaving today. Press "B" on the elevator. The lab is located at the first door on the left as you exit the elevator.  You have been scheduled for a CT scan of the abdomen and pelvis at [facility] ([address]---this is in the same building as [facility]).   You are scheduled on 08-04-2018 at 11:00 am. You should arrive 15 minutes prior to your appointment time for registration. Please follow the written instructions below on the day of your exam:  WARNING: IF YOU ARE ALLERGIC TO IODINE/X-RAY DYE, PLEASE NOTIFY RADIOLOGY IMMEDIATELY AT [phone]! YOU WILL BE GIVEN A 13 HOUR PREMEDICATION PREP.  1) Do not eat or drink anything after 7:00 am (4 hours prior to your test) 2) You have been given 2 bottles of oral contrast to drink. The solution may taste better if refrigerated, but do NOT add ice or any other liquid to this solution. Shake well before drinking.    Drink 1 bottle of contrast @ 9:00 am (2 hours prior to your exam)  Drink 1 bottle of contrast @ 10:00 am (1 hour prior to your exam)  You may take any medications as prescribed with a small amount of water, if necessary. If you take any of the following medications: METFORMIN, GLUCOPHAGE, GLUCOVANCE, AVANDAMET, RIOMET, FORTAMET, [place] MET, JANUMET, GLUMETZA or METAGLIP, you MAY be asked to HOLD this medication 48 hours AFTER the exam.  The purpose of you drinking the oral contrast is to aid in the visualization of your intestinal tract. The contrast solution may cause some diarrhea. Depending on your individual set of symptoms, you may also receive an intravenous injection of x-ray contrast/dye. Plan on being at [facility] for 30 minutes or longer, depending on the type of exam you are having performed.  This test typically takes 30-45 minutes to complete.  If you have any questions regarding your exam or if you need to  reschedule, you may call the CT department at [phone] between the hours of 8:00 am and 5:00 pm, Monday-Friday.  ________________________________________________________________________

## 2018-07-20 ENCOUNTER — Telehealth: Payer: Self-pay | Admitting: Gastroenterology

## 2018-07-20 NOTE — Telephone Encounter (Signed)
Pt called to inform that he thinks that the source of his sxs is a hernia. He would like to speak with Dr. Orvan FalconerBeavers' nurse.

## 2018-07-20 NOTE — Telephone Encounter (Signed)
Patient reports that he has a hernia and is asking if CT is necessary.  He is advised that he should keep the appt for CT to help evaluate the pain and Dr./ Orvan Falconer will make recommendations based on the results.

## 2018-08-04 ENCOUNTER — Ambulatory Visit (INDEPENDENT_AMBULATORY_CARE_PROVIDER_SITE_OTHER)
Admission: RE | Admit: 2018-08-04 | Discharge: 2018-08-04 | Disposition: A | Discharge status: Home / Self Care | Payer: Medicare Other | Source: Ambulatory Visit | Attending: Gastroenterology | Admitting: Gastroenterology

## 2018-08-04 DIAGNOSIS — R933 Abnormal findings on diagnostic imaging of other parts of digestive tract: Secondary | ICD-10-CM | POA: Diagnosis not present

## 2018-08-04 MED ORDER — IOPAMIDOL (ISOVUE-300) INJECTION 61%
100.0000 mL | Freq: Once | INTRAVENOUS | Status: AC | PRN
  Administered 2018-08-04: 100 mL via INTRAVENOUS

## 2018-08-07 ENCOUNTER — Other Ambulatory Visit: Payer: Self-pay

## 2018-08-07 DIAGNOSIS — R140 Abdominal distension (gaseous): Secondary | ICD-10-CM

## 2018-08-07 DIAGNOSIS — R109 Unspecified abdominal pain: Secondary | ICD-10-CM

## 2018-08-07 DIAGNOSIS — R935 Abnormal findings on diagnostic imaging of other abdominal regions, including retroperitoneum: Secondary | ICD-10-CM

## 2018-08-16 ENCOUNTER — Telehealth: Payer: Self-pay | Admitting: Gastroenterology

## 2018-08-16 NOTE — Telephone Encounter (Signed)
Patient had questions about the CT scan scheduled for 08/22/18.  All questions answered. He will call back for any additional questions or concerns.

## 2018-08-16 NOTE — Telephone Encounter (Signed)
Pt called stating that he was returning your call from a week ago. Pls call him.

## 2018-08-22 ENCOUNTER — Ambulatory Visit (INDEPENDENT_AMBULATORY_CARE_PROVIDER_SITE_OTHER)
Admission: RE | Admit: 2018-08-22 | Discharge: 2018-08-22 | Disposition: A | Discharge status: Home / Self Care | Payer: Medicare Other | Source: Ambulatory Visit | Attending: Gastroenterology | Admitting: Gastroenterology

## 2018-08-22 ENCOUNTER — Other Ambulatory Visit: Payer: Self-pay

## 2018-08-22 ENCOUNTER — Encounter (INDEPENDENT_AMBULATORY_CARE_PROVIDER_SITE_OTHER): Payer: Self-pay

## 2018-08-22 DIAGNOSIS — R935 Abnormal findings on diagnostic imaging of other abdominal regions, including retroperitoneum: Secondary | ICD-10-CM

## 2018-08-22 DIAGNOSIS — R140 Abdominal distension (gaseous): Secondary | ICD-10-CM | POA: Diagnosis not present

## 2018-08-22 DIAGNOSIS — R109 Unspecified abdominal pain: Secondary | ICD-10-CM | POA: Diagnosis not present

## 2018-08-22 MED ORDER — IOHEXOL 350 MG/ML SOLN
100.0000 mL | Freq: Once | INTRAVENOUS | Status: AC | PRN
  Administered 2018-08-22: 100 mL via INTRAVENOUS

## 2018-08-30 ENCOUNTER — Telehealth: Payer: Self-pay | Admitting: Gastroenterology

## 2018-08-30 NOTE — Telephone Encounter (Signed)
Patient is asking for an appetite stimulant.  He has lost approximately 15 lbs. Over the last 2 months.  His Vascular appt has been delayed due to Covid-19.

## 2018-08-30 NOTE — Telephone Encounter (Signed)
Pt called in wanting to speak with the nurse he stated that he was told that he will be referred to a surgeon for stomach surgery but has not had a call back to confirm or follow up with hime

## 2018-08-30 NOTE — Telephone Encounter (Signed)
Unfortunately I do not have an appetite stimulant that will help as we think some of his weight loss.  Sheri, please notify the vascular team that he has now lost 15 pounds. Would they consider a telehealth consultation so as not to delay his care further?  Thank you.  KLB

## 2018-08-31 NOTE — Telephone Encounter (Signed)
I called and spoke with Christy at [facility].  They will have his chart reviewed to determine if appropriate for phone/e visit

## 2018-09-21 DIAGNOSIS — R634 Abnormal weight loss: Secondary | ICD-10-CM | POA: Insufficient documentation

## 2018-09-21 DIAGNOSIS — K5733 Diverticulitis of large intestine without perforation or abscess with bleeding: Secondary | ICD-10-CM | POA: Insufficient documentation

## 2018-09-21 DIAGNOSIS — R1032 Left lower quadrant pain: Secondary | ICD-10-CM | POA: Insufficient documentation

## 2018-10-17 ENCOUNTER — Other Ambulatory Visit: Payer: Self-pay

## 2018-10-17 ENCOUNTER — Telehealth (INDEPENDENT_AMBULATORY_CARE_PROVIDER_SITE_OTHER): Payer: Medicare Other | Admitting: Emergency Medicine

## 2018-10-17 ENCOUNTER — Encounter: Payer: Self-pay | Admitting: Emergency Medicine

## 2018-10-17 DIAGNOSIS — R109 Unspecified abdominal pain: Secondary | ICD-10-CM | POA: Diagnosis not present

## 2018-10-17 DIAGNOSIS — Z8719 Personal history of other diseases of the digestive system: Secondary | ICD-10-CM

## 2018-10-17 DIAGNOSIS — G8929 Other chronic pain: Secondary | ICD-10-CM | POA: Diagnosis not present

## 2018-10-17 DIAGNOSIS — K551 Chronic vascular disorders of intestine: Secondary | ICD-10-CM

## 2018-10-17 NOTE — Progress Notes (Signed)
Jeremy Merritt 84 y.o. male who was spoken to via telehealth visit today. He was referred after being seen in [facility] ED for for ab pain, nausea, 10-15 lbs weight loss, chills and lack of appetite x 2-3 months. Symptoms occur in afternoon and no direct relationship to food intake. CT ab/pelvis showed diverticulitis and chronic mesenteric ischemia. He was treated with 10 days oral abx. Signs/symptoms have improved and decreased in frequency after abx use and tums/peptobismol. He continues to have a lack of apettite.   Imaging: 09/04/2018 CT ab/pelvis Impression: 1. Sigmoid diverticulitis. No abscess or free intraperitoneal gas. 2. 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 Moderate narrowing at the origin of the celiac. The small bowel is nondilated and enhances normally.  Assessment: Abdominal pain, weight loss and lack of appetite related to diverticulitis as symptoms improved on antibiotics and not consistent with mesenteric ischemia. Asymptomatic chronic mesenteric ischemia.   Plan: Diverticulitis - Follow up with GI, discuss colonoscopy  Asymptomatic chronic mesenteric ischemia - f/u x 3 months for surveillance with mesenteric duplex. Continue on cardioprotective meds including ASA 81 mg and Atorvastatin.      Telemedicine Encounter- SOAP NOTE Established Patient  This telephone encounter was conducted with the patient's (or proxy's) verbal consent via audio telecommunications: yes/no: Yes Patient was instructed to have this encounter in a suitably private space; and to only have persons present to whom they give permission to participate. In addition, patient identity was confirmed by use of name plus two identifiers (DOB and address).  I discussed the limitations, risks, security and privacy concerns of performing an evaluation and management service by telephone  and the availability of in person appointments. I also discussed with the patient that there may be a patient responsible charge related to this service. The patient expressed understanding and agreed to proceed.  I spent a total of TIME; 0 MIN TO 60 MIN: 15 minutes talking with the patient or their proxy.  No chief complaint on file. Follow-up on chronic abdominal pain  Subjective   Jeremy Merritt is a 84 y.o. male established patient.  Seen by me on 06/15/2018 for upper abdominal pain and referred to GI doctor.  Eventually went to see GI doctor at [facility].  Summary from the visit printed above.  Found to have diverticulitis as well as chronic mesenteric ischemia.  Was kept in the hospital overnight and treated with antibiotics.  Did well.  Follow-up scheduled.  Telephone visit today for follow-up.  Denies abdominal pain at present time.  Denies fever, chills, nausea or vomiting.  Eating okay.  Has occasional constipation.  No other significant symptoms.  HPI   Patient Active Problem List   Diagnosis Date Noted  . Chronic mesenteric ischemia (HCC) 10/17/2018  . History of diverticulitis 10/17/2018  . Chronic abdominal pain 10/17/2018  . Upper abdominal pain 06/15/2018  . Early satiety 06/15/2018  . Abdominal fullness 06/15/2018  . Constipation 06/15/2018    Past Medical History:  Diagnosis Date  . Allergy   . Hyperlipidemia   . Hypertension   . Thyroid disease     Current Outpatient Medications  Medication Sig Dispense Refill  . aspirin EC 81 MG tablet Take 81 mg by mouth daily.    . Calcium Carbonate Antacid (TUMS PO) Take by mouth as needed.    . cetirizine (ZYRTEC) 10 MG tablet Take 0.5-1 tablets (5-10 mg total) by mouth  at bedtime. May stop taking daily after 6 weeks. 90 tablet 0  . Cholecalciferol (VITAMIN D3) 2000 units TABS Take by mouth daily.    Marland Kitchen. levothyroxine (SYNTHROID, LEVOTHROID) 100 MCG tablet Take 100 mcg by mouth daily before breakfast.    .  lisinopril-hydrochlorothiazide (PRINZIDE,ZESTORETIC) 20-12.5 MG tablet Take 1 tablet by mouth daily.    . simvastatin (ZOCOR) 20 MG tablet Take 20 mg by mouth daily.    Marland Kitchen. omeprazole (PRILOSEC) 40 MG capsule Take 1 capsule (40 mg total) by mouth daily. (Patient not taking: Reported on 10/17/2018) 30 capsule 3   No current facility-administered medications for this visit.     No Known Allergies  Social History   Socioeconomic History  . Marital status: Widowed    Spouse name: Not on file  . Number of children: 2  . Years of education: Not on file  . Highest education level: Not on file  Occupational History  . Occupation: retired  Engineer, productionocial Needs  . Financial resource strain: Not on file  . Food insecurity:    Worry: Not on file    Inability: Not on file  . Transportation needs:    Medical: Not on file    Non-medical: Not on file  Tobacco Use  . Smoking status: Never Smoker  . Smokeless tobacco: Never Used  Substance and Sexual Activity  . Alcohol use: No  . Drug use: No  . Sexual activity: Never  Lifestyle  . Physical activity:    Days per week: Not on file    Minutes per session: Not on file  . Stress: Not on file  Relationships  . Social connections:    Talks on phone: Not on file    Gets together: Not on file    Attends religious service: Not on file    Active member of club or organization: Not on file    Attends meetings of clubs or organizations: Not on file    Relationship status: Not on file  . Intimate partner violence:    Fear of current or ex partner: Not on file    Emotionally abused: Not on file    Physically abused: Not on file    Forced sexual activity: Not on file  Other Topics Concern  . Not on file  Social History Narrative  . Not on file    Review of Systems  Constitutional: Negative.  Negative for chills and fever.  HENT: Negative for congestion and sore throat.   Eyes: Negative for discharge and redness.  Respiratory: Negative.  Negative  for cough, sputum production and shortness of breath.   Cardiovascular: Negative.  Negative for chest pain and palpitations.  Gastrointestinal: Positive for constipation. Negative for abdominal pain, blood in stool, diarrhea, melena, nausea and vomiting.  Genitourinary: Negative for dysuria and hematuria.  Skin: Negative for rash.  Neurological: Negative for dizziness, loss of consciousness, weakness and headaches.  All other systems reviewed and are negative.   Objective   Vitals as reported by the patient: None available Awake and oriented x3 in no apparent respiratory distress. There were no vitals filed for this visit.  Diagnoses and all orders for this visit:  Chronic abdominal pain  History of diverticulitis Comments: Recent  Chronic mesenteric ischemia (HCC)  Clinically stable.  No medical concerns identified during this visit. Continue present treatment and medications.  No changes. Follow-up with GI doctor as scheduled as well as vascular surgeon as scheduled.   I discussed the assessment and treatment plan with  the patient. The patient was provided an opportunity to ask questions and all were answered. The patient agreed with the plan and demonstrated an understanding of the instructions.   The patient was advised to call back or seek an in-person evaluation if the symptoms worsen or if the condition fails to improve as anticipated.  I provided 15 minutes of non-face-to-face time during this encounter.  Georgina Quint, MD  Primary Care at [facility]

## 2018-10-17 NOTE — Progress Notes (Signed)
Called patient to triage for appointment. Patient states he is following up on the stomach problems. Patient states he went to the gastroenterology doctor across from [facility], because he was referred by the doctor here, the one he will talk to today. Patient states he went to [facility] emergency room on 09/04/2018 because of weakness and a through check. Patient states no vomiting or diarrhea. Patient complains of constipation on/off.

## 2018-10-20 ENCOUNTER — Ambulatory Visit (HOSPITAL_COMMUNITY)
Admission: EM | Admit: 2018-10-20 | Discharge: 2018-10-20 | Disposition: A | Discharge status: Home / Self Care | Payer: Medicare Other | Attending: Family Medicine | Admitting: Family Medicine

## 2018-10-20 ENCOUNTER — Other Ambulatory Visit: Payer: Self-pay

## 2018-10-20 ENCOUNTER — Ambulatory Visit (INDEPENDENT_AMBULATORY_CARE_PROVIDER_SITE_OTHER): Payer: Medicare Other

## 2018-10-20 ENCOUNTER — Encounter (HOSPITAL_COMMUNITY): Payer: Self-pay

## 2018-10-20 ENCOUNTER — Telehealth: Payer: Self-pay

## 2018-10-20 DIAGNOSIS — R197 Diarrhea, unspecified: Secondary | ICD-10-CM

## 2018-10-20 MED ORDER — HYDROCORTISONE 2.5 % RE CREA: 1.0000 "application " | TOPICAL_CREAM | Freq: Two times a day (BID) | RECTAL | 0 refills | Status: DC

## 2018-10-20 NOTE — Discharge Instructions (Addendum)
Your x ray was normal No concern for bowel obstruction The diarrhea may be from the mil of magnesia I would back off on this  Start taking a probiotic to regulate the bowels, the pharmacist can help you find this at the pharmacy.  Make sure you are drinking fluids and get some Gatorade to help with electrolytes.  If your symptoms continue or worsen you will need to go to the hospital.

## 2018-10-20 NOTE — ED Provider Notes (Signed)
[facility]    CSN: [id number] Arrival date & time: 10/20/18  4098     History   Chief Complaint Chief Complaint  Patient presents with  . Diarrhea    HPI Jeremy Merritt is a 84 y.o. male.   Patient is a 84 year old male with past medical history of hyperlipidemia, hypertension, thyroid disease, chronic mesenteric ischemia, diverticulitis, chronic abdominal pain, constipation.  He presents today with 2 days of diarrhea.  He is going to the bathroom about every 15 minutes.  Reports the stools as soft but not watery.  Denies any foul smell.  Prior to this he had an episode of constipation and took milk of magnesia.  He denies any abdominal pain.  Feels as if he may be dehydrated and is having some weakness.  Patient recently in the hospital approximately 1 month ago for diverticulitis.  He was treated with 10-day course of Augmentin.  He finished this medication.  He has had some loss of appetite.  No recent sick contacts or recent traveling.  No chest pain, shortness of breath or dizziness.  He is having some pain in rectal area and blood with wiping.  Denies any blood clots or noticeable blood in the toilet.  Denies any dark stools.  He has been taking Pepto-Bismol without any relief of his symptoms.   ROS per HPI      Past Medical History:  Diagnosis Date  . Allergy   . Hyperlipidemia   . Hypertension   . Thyroid disease     Patient Active Problem List   Diagnosis Date Noted  . Chronic mesenteric ischemia (HCC) 10/17/2018  . History of diverticulitis 10/17/2018  . Chronic abdominal pain 10/17/2018  . Upper abdominal pain 06/15/2018  . Early satiety 06/15/2018  . Abdominal fullness 06/15/2018  . Constipation 06/15/2018    Past Surgical History:  Procedure Laterality Date  . INGUINAL HERNIA REPAIR         Home Medications    Prior to Admission medications   Medication Sig Start Date End Date Taking? Authorizing Provider  aspirin EC 81 MG  tablet Take 81 mg by mouth daily.    [provider]  Calcium Carbonate Antacid (TUMS PO) Take by mouth as needed.    [provider]  cetirizine (ZYRTEC) 10 MG tablet Take 0.5-1 tablets (5-10 mg total) by mouth at bedtime. May stop taking daily after 6 weeks. 10/14/16   Benjiman Core D, PA-C  Cholecalciferol (VITAMIN D3) 2000 units TABS Take by mouth daily.    [provider]  hydrocortisone (ANUSOL-HC) 2.5 % rectal cream Place 1 application rectally 2 (two) times daily. 10/20/18   Dyson Sevey, Gloris Manchester A, NP  levothyroxine (SYNTHROID, LEVOTHROID) 100 MCG tablet Take 100 mcg by mouth daily before breakfast.    [provider]  lisinopril-hydrochlorothiazide (PRINZIDE,ZESTORETIC) 20-12.5 MG tablet Take 1 tablet by mouth daily.    [provider]  omeprazole (PRILOSEC) 40 MG capsule Take 1 capsule (40 mg total) by mouth daily. Patient not taking: Reported on 10/17/2018 06/15/18   Georgina Quint, MD  simvastatin (ZOCOR) 20 MG tablet Take 20 mg by mouth daily.    [provider]    Family History Family History  Problem Relation Age of Onset  . Other Mother        died of old age  . Other Father        Black lung  . Thyroid cancer Sister   . Kidney failure Brother  on dialysis  . Colon cancer Neg Hx   . Liver cancer Neg Hx   . Stomach cancer Neg Hx     Social History Social History   Tobacco Use  . Smoking status: Never Smoker  . Smokeless tobacco: Never Used  Substance Use Topics  . Alcohol use: No  . Drug use: No     Allergies   Patient has no known allergies.   Review of Systems Review of Systems   Physical Exam Triage Vital Signs ED Triage Vitals  Enc Vitals Group     BP 10/20/18 1025 133/65     Pulse Rate 10/20/18 1025 (!) 115     Resp 10/20/18 1025 18     Temp 10/20/18 1025 98.4 F (36.9 C)     Temp Source 10/20/18 1025 Oral     SpO2 10/20/18 1025 99 %     Weight --      Height --      Head  Circumference --      Peak Flow --      Pain Score 10/20/18 1023 0     Pain Loc --      Pain Edu? --      Excl. in GC? --    No data found.  Updated Vital Signs BP 133/65 (BP Location: Right Arm)   Pulse 97   Temp 98.4 F (36.9 C) (Oral)   Resp 18   SpO2 99%   Visual Acuity Right Eye Distance:   Left Eye Distance:   Bilateral Distance:    Right Eye Near:   Left Eye Near:    Bilateral Near:     Physical Exam Vitals signs and nursing note reviewed.  Constitutional:      Appearance: Normal appearance.  HENT:     Head: Normocephalic and atraumatic.     Nose: Nose normal.  Eyes:     Conjunctiva/sclera: Conjunctivae normal.  Neck:     Musculoskeletal: Normal range of motion.  Pulmonary:     Effort: Pulmonary effort is normal.  Abdominal:     Palpations: Abdomen is soft.     Tenderness: There is no abdominal tenderness.  Musculoskeletal: Normal range of motion.  Skin:    General: Skin is warm and dry.  Neurological:     Mental Status: He is alert.  Psychiatric:        Mood and Affect: Mood normal.      UC Treatments / Results  Labs (all labs ordered are listed, but only abnormal results are displayed) Labs Reviewed - No data to display  EKG None  Radiology No results found.  Procedures Procedures (including critical care time)  Medications Ordered in UC Medications - No data to display  Initial Impression / Assessment and Plan / UC Course  I have reviewed the triage vital signs and the nursing notes.  Pertinent labs & imaging results that were available during my care of the patient were reviewed by me and considered in my medical decision making (see chart for details).     X ray negative for any acute abnormality No bowel obstruction or significant stool burden Most likely diarrhea due to taking the milk of magnesia Rectal cream prescribed for rectal pain Instructed to increase fluids and Gatorade for electrolytes. Recommended taking  probiotic.  Follow up as needed for continued or worsening symptoms   Final Clinical Impressions(s) / UC Diagnoses   Final diagnoses:  Diarrhea, unspecified type     Discharge Instructions  Your x ray was normal No concern for bowel obstruction The diarrhea may be from the mil of magnesia I would back off on this  Start taking a probiotic to regulate the bowels, the pharmacist can help you find this at the pharmacy.  Make sure you are drinking fluids and get some Gatorade to help with electrolytes.  If your symptoms continue or worsen you will need to go to the hospital.      ED Prescriptions    Medication Sig Dispense Auth. Provider   hydrocortisone (ANUSOL-HC) 2.5 % rectal cream Place 1 application rectally 2 (two) times daily. 30 g Dahlia ByesBast, Lanasia Porras A, NP     Controlled Substance Prescriptions [place] Controlled Substance Registry consulted? Not Applicable   Janace ArisBast, Mykeisha Dysert A, NP 10/23/18 [phone]

## 2018-10-20 NOTE — Telephone Encounter (Signed)
Pt called because he is having frequent diarrhea. [facility]ed [facility] goes [facility], he has a follow up appt with Sagardia for 10/23/2018

## 2018-10-20 NOTE — ED Triage Notes (Signed)
Patient presents to Urgent Care with complaints of diarrhea since 2 days ago. Patient reports it is not watery, but is frequent, pt has been trying to drink water but thinks he may be dehydrated.

## 2018-10-23 ENCOUNTER — Other Ambulatory Visit: Payer: Self-pay

## 2018-10-23 ENCOUNTER — Telehealth: Payer: Medicare Other | Admitting: Emergency Medicine

## 2018-11-09 ENCOUNTER — Other Ambulatory Visit: Payer: Self-pay | Admitting: Pulmonary Disease

## 2018-11-09 DIAGNOSIS — R590 Localized enlarged lymph nodes: Secondary | ICD-10-CM

## 2019-02-09 ENCOUNTER — Ambulatory Visit: Payer: Medicare Other

## 2019-03-16 ENCOUNTER — Other Ambulatory Visit: Payer: Self-pay

## 2019-03-16 ENCOUNTER — Ambulatory Visit
Admission: RE | Admit: 2019-03-16 | Discharge: 2019-03-16 | Disposition: A | Discharge status: Home / Self Care | Payer: Medicare Other | Source: Ambulatory Visit | Attending: Pulmonary Disease | Admitting: Pulmonary Disease

## 2019-03-16 DIAGNOSIS — R590 Localized enlarged lymph nodes: Secondary | ICD-10-CM | POA: Diagnosis not present

## 2019-03-16 LAB — POCT I-STAT CREATININE: Creatinine, Ser: 1.2 mg/dL (ref 0.61–1.24)

## 2019-03-16 MED ORDER — IOHEXOL 300 MG/ML  SOLN
75.0000 mL | Freq: Once | INTRAMUSCULAR | Status: AC | PRN
  Administered 2019-03-16: 75 mL via INTRAVENOUS

## 2019-03-28 ENCOUNTER — Other Ambulatory Visit: Payer: Self-pay | Admitting: Pulmonary Disease

## 2019-03-28 DIAGNOSIS — K559 Vascular disorder of intestine, unspecified: Secondary | ICD-10-CM

## 2019-03-28 DIAGNOSIS — R911 Solitary pulmonary nodule: Secondary | ICD-10-CM

## 2019-04-02 ENCOUNTER — Ambulatory Visit: Payer: Medicare Other

## 2019-04-05 ENCOUNTER — Ambulatory Visit: Payer: Medicare Other

## 2019-04-09 ENCOUNTER — Ambulatory Visit: Payer: Medicare Other

## 2019-04-17 ENCOUNTER — Other Ambulatory Visit (INDEPENDENT_AMBULATORY_CARE_PROVIDER_SITE_OTHER): Payer: Self-pay | Admitting: Vascular Surgery

## 2019-04-17 ENCOUNTER — Inpatient Hospital Stay
Admission: AD | Admit: 2019-04-17 | Discharge: 2019-04-19 | DRG: 357 | Disposition: A | Discharge status: Home / Self Care | Payer: Medicare Other | Source: Ambulatory Visit | Attending: Vascular Surgery | Admitting: Vascular Surgery

## 2019-04-17 ENCOUNTER — Ambulatory Visit (INDEPENDENT_AMBULATORY_CARE_PROVIDER_SITE_OTHER): Payer: Medicare Other | Admitting: Vascular Surgery

## 2019-04-17 ENCOUNTER — Other Ambulatory Visit: Payer: Self-pay

## 2019-04-17 ENCOUNTER — Encounter (INDEPENDENT_AMBULATORY_CARE_PROVIDER_SITE_OTHER): Payer: Self-pay

## 2019-04-17 ENCOUNTER — Encounter (INDEPENDENT_AMBULATORY_CARE_PROVIDER_SITE_OTHER): Payer: Self-pay | Admitting: Vascular Surgery

## 2019-04-17 VITALS — BP 107/74 | HR 114 | Resp 16 | Ht 67.0 in | Wt 105.0 lb

## 2019-04-17 DIAGNOSIS — I10 Essential (primary) hypertension: Secondary | ICD-10-CM | POA: Diagnosis present

## 2019-04-17 DIAGNOSIS — Z79890 Hormone replacement therapy (postmenopausal): Secondary | ICD-10-CM

## 2019-04-17 DIAGNOSIS — R634 Abnormal weight loss: Secondary | ICD-10-CM

## 2019-04-17 DIAGNOSIS — E43 Unspecified severe protein-calorie malnutrition: Secondary | ICD-10-CM | POA: Insufficient documentation

## 2019-04-17 DIAGNOSIS — Z681 Body mass index (BMI) 19 or less, adult: Secondary | ICD-10-CM | POA: Diagnosis not present

## 2019-04-17 DIAGNOSIS — K551 Chronic vascular disorders of intestine: Secondary | ICD-10-CM

## 2019-04-17 DIAGNOSIS — Z20828 Contact with and (suspected) exposure to other viral communicable diseases: Secondary | ICD-10-CM | POA: Diagnosis present

## 2019-04-17 DIAGNOSIS — I774 Celiac artery compression syndrome: Secondary | ICD-10-CM | POA: Diagnosis present

## 2019-04-17 DIAGNOSIS — K559 Vascular disorder of intestine, unspecified: Secondary | ICD-10-CM | POA: Diagnosis present

## 2019-04-17 DIAGNOSIS — R64 Cachexia: Secondary | ICD-10-CM | POA: Diagnosis present

## 2019-04-17 DIAGNOSIS — E039 Hypothyroidism, unspecified: Secondary | ICD-10-CM | POA: Diagnosis present

## 2019-04-17 DIAGNOSIS — K55059 Acute (reversible) ischemia of intestine, part and extent unspecified: Secondary | ICD-10-CM | POA: Diagnosis not present

## 2019-04-17 DIAGNOSIS — E785 Hyperlipidemia, unspecified: Secondary | ICD-10-CM | POA: Diagnosis present

## 2019-04-17 DIAGNOSIS — I771 Stricture of artery: Secondary | ICD-10-CM | POA: Diagnosis not present

## 2019-04-17 DIAGNOSIS — Z7982 Long term (current) use of aspirin: Secondary | ICD-10-CM | POA: Diagnosis not present

## 2019-04-17 LAB — COMPREHENSIVE METABOLIC PANEL
ALT: 15 U/L (ref 0–44)
AST: 16 U/L (ref 15–41)
Albumin: 3.2 g/dL — ABNORMAL LOW (ref 3.5–5.0)
Alkaline Phosphatase: 106 U/L (ref 38–126)
Anion gap: 11 (ref 5–15)
BUN: 30 mg/dL — ABNORMAL HIGH (ref 8–23)
CO2: 26 mmol/L (ref 22–32)
Calcium: 8.8 mg/dL — ABNORMAL LOW (ref 8.9–10.3)
Chloride: 105 mmol/L (ref 98–111)
Creatinine, Ser: 1.33 mg/dL — ABNORMAL HIGH (ref 0.61–1.24)
GFR calc Af Amer: 57 mL/min — ABNORMAL LOW (ref >60)
GFR calc non Af Amer: 49 mL/min — ABNORMAL LOW (ref >60)
Glucose, Bld: 124 mg/dL — ABNORMAL HIGH (ref 70–99)
Potassium: 4.3 mmol/L (ref 3.5–5.1)
Sodium: 142 mmol/L (ref 135–145)
Total Bilirubin: 0.9 mg/dL (ref 0.3–1.2)
Total Protein: 6.8 g/dL (ref 6.5–8.1)

## 2019-04-17 LAB — CBC WITH DIFFERENTIAL/PLATELET
Abs Immature Granulocytes: 0.09 10*3/uL — ABNORMAL HIGH (ref 0.00–0.07)
Basophils Absolute: 0 10*3/uL (ref 0.0–0.1)
Basophils Relative: 0 %
Eosinophils Absolute: 0 10*3/uL (ref 0.0–0.5)
Eosinophils Relative: 0 %
HCT: 37.5 % — ABNORMAL LOW (ref 39.0–52.0)
Hemoglobin: 12.9 g/dL — ABNORMAL LOW (ref 13.0–17.0)
Immature Granulocytes: 1 %
Lymphocytes Relative: 9 %
Lymphs Abs: 1.1 10*3/uL (ref 0.7–4.0)
MCH: 29.9 pg (ref 26.0–34.0)
MCHC: 34.4 g/dL (ref 30.0–36.0)
MCV: 87 fL (ref 80.0–100.0)
Monocytes Absolute: 0.8 10*3/uL (ref 0.1–1.0)
Monocytes Relative: 7 %
Neutro Abs: 10.5 10*3/uL — ABNORMAL HIGH (ref 1.7–7.7)
Neutrophils Relative %: 83 %
Platelets: 385 10*3/uL (ref 150–400)
RBC: 4.31 MIL/uL (ref 4.22–5.81)
RDW: 13.2 % (ref 11.5–15.5)
WBC: 12.5 10*3/uL — ABNORMAL HIGH (ref 4.0–10.5)
nRBC: 0 % (ref 0.0–0.2)

## 2019-04-17 LAB — PROTIME-INR
INR: 1.2 (ref 0.8–1.2)
Prothrombin Time: 14.8 seconds (ref 11.4–15.2)

## 2019-04-17 LAB — TYPE AND SCREEN
ABO/RH(D): O POS
Antibody Screen: NEGATIVE

## 2019-04-17 LAB — MAGNESIUM: Magnesium: 2.4 mg/dL (ref 1.7–2.4)

## 2019-04-17 LAB — APTT: aPTT: 32 seconds (ref 24–36)

## 2019-04-17 MED ORDER — SODIUM CHLORIDE 0.9 % IV SOLN
INTRAVENOUS | Status: DC
  Administered 2019-04-17 – 2019-04-18 (×2): via INTRAVENOUS

## 2019-04-17 MED ORDER — ONDANSETRON HCL 4 MG/2ML IJ SOLN: 4.0000 mg | Freq: Four times a day (QID) | INTRAMUSCULAR | Status: DC | PRN

## 2019-04-17 MED ORDER — PANTOPRAZOLE SODIUM 40 MG PO TBEC
40.0000 mg | DELAYED_RELEASE_TABLET | Freq: Every day | ORAL | Status: DC
  Administered 2019-04-17 – 2019-04-18 (×2): 40 mg via ORAL
  Filled 2019-04-17 (×2): qty 1

## 2019-04-17 MED ORDER — HYDROCHLOROTHIAZIDE 12.5 MG PO CAPS
12.5000 mg | ORAL_CAPSULE | Freq: Every day | ORAL | Status: DC
  Administered 2019-04-18: 12.5 mg via ORAL
  Filled 2019-04-17: qty 1

## 2019-04-17 MED ORDER — ONDANSETRON HCL 4 MG PO TABS: 4.0000 mg | ORAL_TABLET | Freq: Four times a day (QID) | ORAL | Status: DC | PRN

## 2019-04-17 MED ORDER — LISINOPRIL 20 MG PO TABS
20.0000 mg | ORAL_TABLET | Freq: Every day | ORAL | Status: DC
  Administered 2019-04-18: 20 mg via ORAL
  Filled 2019-04-17: qty 1

## 2019-04-17 MED ORDER — HEPARIN BOLUS VIA INFUSION
2500.0000 [IU] | Freq: Once | INTRAVENOUS | Status: AC
  Administered 2019-04-17: 2500 [IU] via INTRAVENOUS
  Filled 2019-04-17: qty 2500

## 2019-04-17 MED ORDER — TRAMADOL HCL 50 MG PO TABS: 50.0000 mg | ORAL_TABLET | Freq: Four times a day (QID) | ORAL | Status: DC | PRN

## 2019-04-17 MED ORDER — LEVOTHYROXINE SODIUM 100 MCG PO TABS: 100.0000 ug | ORAL_TABLET | Freq: Every day | ORAL | Status: DC

## 2019-04-17 MED ORDER — ACETAMINOPHEN 650 MG RE SUPP: 650.0000 mg | Freq: Four times a day (QID) | RECTAL | Status: DC | PRN

## 2019-04-17 MED ORDER — SIMVASTATIN 20 MG PO TABS
20.0000 mg | ORAL_TABLET | Freq: Every day | ORAL | Status: DC
  Administered 2019-04-17: 19:00:00 20 mg via ORAL
  Filled 2019-04-17 (×3): qty 1

## 2019-04-17 MED ORDER — HEPARIN (PORCINE) 25000 UT/250ML-% IV SOLN
800.0000 [IU]/h | INTRAVENOUS | Status: DC
  Administered 2019-04-17: 19:00:00 800 [IU]/h via INTRAVENOUS
  Filled 2019-04-17 (×2): qty 250

## 2019-04-17 MED ORDER — ACETAMINOPHEN 325 MG PO TABS: 650.0000 mg | ORAL_TABLET | Freq: Four times a day (QID) | ORAL | Status: DC | PRN

## 2019-04-17 NOTE — Assessment & Plan Note (Signed)
lipid control important in reducing the progression of atherosclerotic disease. Continue statin therapy  

## 2019-04-17 NOTE — Assessment & Plan Note (Signed)
blood pressure control important in reducing the progression of atherosclerotic disease. On appropriate oral medications.  

## 2019-04-17 NOTE — Assessment & Plan Note (Signed)
Chronic mesenteric ischemia is the cause.

## 2019-04-17 NOTE — Consult Note (Addendum)
ANTICOAGULATION CONSULT NOTE - Initial Consult  Pharmacy Consult for Hepain Indication: mesenteric ischemia  No Known Allergies  Patient Measurements:   Heparin Dosing Weight: 47.6 kg  Vital Signs: BP: 107/74 (11/10 1316) Pulse Rate: 114 (11/10 1316)  Labs: No results for input(s): HGB, HCT, PLT, APTT, LABPROT, INR, HEPARINUNFRC, HEPRLOWMOCWT, CREATININE, CKTOTAL, CKMB, TROPONINIHS in the last 72 hours.  CrCl cannot be calculated (Patient's most recent lab result is older than the maximum 21 days allowed.).   Medical History: Past Medical History:  Diagnosis Date  . Allergy   . Hyperlipidemia   . Hypertension   . Thyroid disease     Medications:  No anticoagulation prior to admission per prior to admission medications  Assessment: Patient is an 84 y/o M with history of weight loss admitted for evaluation of chronic mesenteric ischemia. 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 Vascular surgery with plan for intervention 11/11 in the AM. Pharmacy has been consulted for heparin infusion for mesenteric ischemia. Baseline H&H and Plt within normal limits. Baseline PT-INR and aPTT within normal limits.   Goal of Therapy:  Heparin level 0.3-0.7 units/ml Monitor platelets by anticoagulation protocol: Yes   Plan:  -Heparin 2500 unit IV bolus x 1 followed by continuous infusion at 800 units/hr -Heparin level 8 hours after initiation of infusion -Daily CBC per protocol  [place] Resident 04/17/2019,4:16 PM

## 2019-04-17 NOTE — Progress Notes (Signed)
Patient ID: Jeremy Merritt, male   DOB: [birth year], 84 y.o.   MRN: [id number]  Chief Complaint  Patient presents with  . New Patient (Initial Visit)    SMA consult    HPI Jeremy Merritt is a 84 y.o. male.  I am asked to see the patient by Dr. Lanney Gins for evaluation of chronic mesenteric ischemia.  The patient reports progressive symptoms over the past year or so of postprandial abdominal pain, worsening food fear, and weight loss.  He has lost over 30 pounds from an already very slight frame.  He is cachectic and emaciated at this point.  He is essentially afraid to eat anything and has developed significant malnutrition.  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  I have independently reviewed his CT scan from March of this year which was a CT with contrast.  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  Given these findings and his severe symptomatology, he is referred for urgent evaluation   Past Medical History:  Diagnosis Date  . Allergy   . Hyperlipidemia   . Hypertension   . Thyroid disease     Past Surgical History:  Procedure Laterality Date  . INGUINAL HERNIA REPAIR       Family History  Problem Relation Age of Onset  . Other Mother        died of old age  . Other Father        Black lung  . Thyroid cancer Sister   . Kidney failure Brother        on dialysis  . Colon cancer Neg Hx   . Liver cancer Neg Hx   . Stomach cancer Neg Hx      Social History   Tobacco Use  . Smoking status: Never Smoker  . Smokeless tobacco: Never Used  Substance Use Topics  . Alcohol use: No  . Drug use: No     No Known Allergies  Current Outpatient Medications  Medication Sig Dispense Refill  . aspirin EC 81 MG tablet Take 81 mg by mouth daily.    . Calcium  Carbonate Antacid (TUMS PO) Take by mouth as needed.    . cetirizine (ZYRTEC) 10 MG tablet Take 0.5-1 tablets (5-10 mg total) by mouth at bedtime. May stop taking daily after 6 weeks. 90 tablet 0  . Cholecalciferol (VITAMIN D3) 2000 units TABS Take by mouth daily.    Marland Kitchen levothyroxine (SYNTHROID, LEVOTHROID) 100 MCG tablet Take 100 mcg by mouth daily before breakfast.    . lisinopril-hydrochlorothiazide (PRINZIDE,ZESTORETIC) 20-12.5 MG tablet Take 1 tablet by mouth daily.    . megestrol (MEGACE) 40 MG tablet Take 40 mg by mouth daily.    Marland Kitchen omeprazole (PRILOSEC) 40 MG capsule Take 1 capsule (40 mg total) by mouth daily. 30 capsule 3  . simvastatin (ZOCOR) 20 MG tablet Take 20 mg by mouth daily.    . hydrocortisone (ANUSOL-HC) 2.5 % rectal cream Place 1 application rectally 2 (two) times daily. (Patient not taking: Reported on 04/17/2019) 30 g 0   No current facility-administered medications for this visit.       REVIEW OF SYSTEMS (Negative unless checked)  Constitutional: [x] Weight loss  [] Fever  [] Chills Cardiac: [] Chest pain   [] Chest pressure   [] Palpitations   [] Shortness of  breath when laying flat   [] Shortness of breath at rest   [] Shortness of breath with exertion. Vascular:  [] Pain in legs with walking   [] Pain in legs at rest   [] Pain in legs when laying flat   [] Claudication   [] Pain in feet when walking  [] Pain in feet at rest  [] Pain in feet when laying flat   [] History of DVT   [] Phlebitis   [] Swelling in legs   [] Varicose veins   [] Non-healing ulcers Pulmonary:   [] Uses home oxygen   [] Productive cough   [] Hemoptysis   [] Wheeze  [] COPD   [] Asthma Neurologic:  [] Dizziness  [] Blackouts   [] Seizures   [] History of stroke   [] History of TIA  [] Aphasia   [] Temporary blindness   [] Dysphagia   [] Weakness or numbness in arms   [] Weakness or numbness in legs Musculoskeletal:  [x] Arthritis   [] Joint swelling   [x] Joint pain   [] Low back pain Hematologic:  [] Easy bruising  [] Easy bleeding    [] Hypercoagulable state   [] Anemic  [] Hepatitis Gastrointestinal:  [] Blood in stool   [] Vomiting blood  [x] Gastroesophageal reflux/heartburn   [x] Abdominal pain Genitourinary:  [] Chronic kidney disease   [] Difficult urination  [] Frequent urination  [] Burning with urination   [] Hematuria Skin:  [] Rashes   [] Ulcers   [] Wounds Psychological:  [] History of anxiety   []  History of major depression.    Physical Exam BP 107/74 (BP Location: Left Arm)   Pulse (!) 114   Resp 16   Ht 5\' 7"  (1.702 m)   Wt 105 lb (47.6 kg)   BMI 16.45 kg/m  Gen:  Cachectic, NAD Head: [place]/AT, + temporalis wasting. Ear/Nose/Throat: Hearing diminished, nares w/o erythema or drainage, oropharynx w/o Erythema/Exudate Eyes: Conjunctiva clear, sclera non-icteric  Neck: trachea midline.  No JVD.  Pulmonary:  Good air movement, respirations not labored, no use of accessory muscles  Cardiac: RRR, no JVD Vascular:  Vessel Right Left  Radial Palpable Palpable                          DP 1+ 1+  PT NP Trace    Gastrointestinal:. No masses, surgical incisions, or scars. Generalized tenderness. Musculoskeletal: M/S 5/5 throughout.  Extremities without ischemic changes.  No deformity or atrophy. No edema. Neurologic: Sensation grossly intact in extremities.  Symmetrical.  Speech is fluent. Motor exam as listed above. Psychiatric: Judgment intact, Mood & affect appropriate for pt's clinical situation. Dermatologic: No rashes or ulcers noted.  No cellulitis or open wounds.    Radiology No results found.  Labs Recent Results (from the past 2160 hour(s))  I-STAT creatinine     Status: None   Collection Time: 03/16/19  9:06 AM  Result Value Ref Range   Creatinine, Ser 1.20 0.61 - 1.24 mg/dL    Assessment/Plan:  Essential hypertension blood pressure control important in reducing the progression of atherosclerotic disease. On appropriate oral medications.   Hyperlipidemia lipid control important in reducing  the progression of atherosclerotic disease. Continue statin therapy   Loss of weight Chronic mesenteric ischemia is the cause.   Chronic mesenteric ischemia (HCC)  I have independently reviewed his CT scan from March of this year which was a CT with contrast.  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 This is a severe and life-threatening problem.  The patient has essentially wasted away and is  cachectic and malnourished.  I am going to admit him to the hospital and plan on intervention on him tomorrow morning.  We will get a Covid test tonight.  We will give him pain medicine as needed.  He can eat until midnight and then will be n.p.o. after midnight.  I discussed the procedure in detail.  I discussed the risks and benefits of the procedure.  The patient understands the grave nature of the situation.  Also discussed that we would only intervene on one of the 2 vessels and he may have to come back for a later procedure for the other vessel.  Also discussed if the SMA was occluded, it may not be amenable to revascularization.      [name] 04/17/2019, 2:25 PM   This note was created with Dragon medical transcription system.  Any errors from dictation are unintentional.

## 2019-04-17 NOTE — Patient Instructions (Signed)
Chronic Mesenteric Ischemia  Chronic mesenteric ischemia is poor blood flow (circulation) in the vessels that supply blood to the stomach, intestines, and liver (mesenteric organs). When the blood supply is severely restricted, these organs cannot work properly. This condition is also called mesenteric angina, or intestinal angina. This condition is a long-term (chronic) condition. It happens when an artery or vein that provides blood to the mesenteric organs gradually becomes blocked or narrows over time, restricting the blood supply to these organs. What are the causes? This condition is commonly caused by fatty deposits that build up in an artery (plaque), which can narrow the artery and restrict blood flow. Other causes include:  Weakened areas in blood vessel walls (aneurysms).  Conditions that cause twisting or inflammation of blood vessels, such as fibromuscular dysplasia or arteritis.  A disorder in which blood clots form in the veins (venous thrombosis).  Scarring and thickening (fibrosis) of blood vessels caused by radiation therapy.  A tear in the aorta, the body's main artery (aortic dissection).  Blood vessel problems after illegal drug use, such as use of cocaine.  Tumors in the nervous system (neurofibromatosis).  Certain autoimmune diseases, such as lupus. What increases the risk? The following factors may make you more likely to develop this condition:  Being male.  Being over age 50, especially if you have a history of heart problems.  Smoking.  Having congestive heart failure.  Having an irregular heartbeat (arrhythmia).  Having a history of heart attack or stroke.  Having diabetes.  Having high cholesterol.  Having high blood pressure (hypertension).  Being overweight or obese.  Having kidney disease (renal disease) that requires dialysis. What are the signs or symptoms? Symptoms of this condition include:  Pain or cramps in the abdomen that  develop 15-60 minutes after a meal. This pain may last for 1-3 hours. Some people may develop a fear of eating because of this symptom.  Weight loss.  Diarrhea.  Bloody stool.  Nausea.  Vomiting.  Bloating.  Abdominal pain after stress or with exercise. How is this diagnosed? This condition is diagnosed based on:  Your medical history.  A physical exam.  Tests, such as: ? Ultrasound. ? CT scan. ? Blood tests. ? Urine tests. ? An imaging test that involves injecting a dye into your arteries to show blood flow through blood vessels (angiogram). This can help to show if there are any blockages in the vessels that lead to the intestines. ? Passing a small probe through the mouth and into the stomach to measure the output of carbon dioxide (gastric tonometry). This can help to indicate whether there is decreased blood flow to the stomach and intestines. How is this treated? This condition may be treated with:  Dietary changes such as eating smaller, low-fat, meals more frequently.  Lifestyle changes to treat underlying conditions that contribute to the disease, such as high cholesterol and high blood pressure.  Medicines to reduce blood clotting and increase blood flow.  Surgery to remove the blockage, repair arteries or veins, and restore blood flow. This may involve: ? Angioplasty. This is surgery to widen the affected artery, reduce the blockage, and sometimes insert a small, mesh tube (stent). ? Bypass surgery. This may be done to go around (bypass) the blockage and reconnect healthy arteries or veins. ? Placing a stent in the affected area. This may be done to help keep blocked arteries open. Follow these instructions at home: Eating and drinking   Eat a heart-healthy diet. This   includes fresh fruits and vegetables, whole grains, and lean proteins like chicken, fish, and beans.  Avoid foods that contain a lot of: ? Salt (sodium). ? Sugar. ? Saturated fat (such as  red meat). ? Trans fat (such as in fried foods).  Stay hydrated. Drink enough fluid to keep your urine pale yellow. Lifestyle  Stay active and get regular exercise as told by your health care provider. Aim for 150 minutes of moderate activity or 75 minutes of vigorous activity a week. Ask your health care provider what activities and forms of exercise are safe for you.  Maintain a healthy weight.  Work with your health care provider to manage your cholesterol.  Manage any other health problems you have, such as high blood pressure, diabetes, or heart rhythm problems.  Do not use any products that contain nicotine or tobacco, such as cigarettes, e-cigarettes, and chewing tobacco. If you need help quitting, ask your health care provider. General instructions  Take over-the-counter and prescription medicines only as told by your health care provider.  Keep all follow-up visits as told by your health care provider. This is important.  You may need to take actions to prevent or treat constipation, such as: ? Drink enough fluid to keep your urine pale yellow. ? Take over-the-counter or prescription medicines. ? Eat foods that are high in fiber, such as beans, whole grains, and fresh fruits and vegetables. ? Limit foods that are high in fat and processed sugars, such as fried or sweet foods. Contact a health care provider if:  Your symptoms do not improve or they return after treatment.  You have a fever.  You are constipated. Get help right away if you:  Have severe abdominal pain.  Have severe chest pain.  Have shortness of breath.  Feel weak or dizzy.  Have fast or irregular heartbeats (palpitations).  Have numbness or weakness in your face, arm, or leg.  Are confused.  Have trouble speaking or people have trouble understanding what you are saying.  Have trouble urinating.  Have blood in your stool.  Have severe nausea, vomiting, or persistent diarrhea. These  symptoms may represent a serious problem that is an emergency. Do not wait to see if the symptoms will go away. Get medical help right away. Call your local emergency services (911 in the [place] Do not drive yourself to the hospital. Summary  Mesenteric ischemia is poor circulation in the vessels that supply blood to the the stomach, intestines, and liver (mesenteric organs).  This condition happens when an artery or vein that provides blood to the mesenteric organs gradually becomes blocked or narrow, restricting the blood supply to the organs.  This condition is commonly caused by fatty deposits that build up in an artery (plaque), which can narrow the artery and restrict blood flow.  You are more likely to develop this condition if you are over age 50 and have a history of heart problems, high blood pressure, diabetes, or high cholesterol.  This condition is usually treated with medicines, dietary and lifestyle changes, and surgery to remove the blockage, repair arteries or veins, and restore blood flow. This information is not intended to replace advice given to you by your health care provider. Make sure you discuss any questions you have with your health care provider. Document Released: 01/11/2011 Document Revised: 01/27/2018 Document Reviewed: 01/27/2018 Elsevier Patient Education  2020 [employer]  

## 2019-04-17 NOTE — Assessment & Plan Note (Signed)
I have independently reviewed his CT scan from March of this year which was a CT with contrast.  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 This is a severe and life-threatening problem.  The patient has essentially wasted away and is cachectic and malnourished.  I am going to admit him to the hospital and plan on intervention on him tomorrow morning.  We will get a Covid test tonight.  We will give him pain medicine as needed.  He can eat until midnight and then will be n.p.o. after midnight.  I discussed the procedure in detail.  I discussed the risks and benefits of the procedure.  The patient understands the grave nature of the situation.  Also discussed that we would only intervene on one of the 2 vessels and he may have to come back for a later procedure for the other vessel.  Also discussed if the SMA was occluded, it may not be amenable to revascularization.

## 2019-04-18 ENCOUNTER — Inpatient Hospital Stay: Admission: RE | Admit: 2019-04-18 | Payer: Medicare Other | Source: Home / Self Care | Admitting: Vascular Surgery

## 2019-04-18 ENCOUNTER — Encounter
Admission: AD | Disposition: A | Discharge status: Home / Self Care | Payer: Self-pay | Source: Ambulatory Visit | Attending: Vascular Surgery

## 2019-04-18 DIAGNOSIS — K55059 Acute (reversible) ischemia of intestine, part and extent unspecified: Secondary | ICD-10-CM | POA: Diagnosis not present

## 2019-04-18 DIAGNOSIS — E43 Unspecified severe protein-calorie malnutrition: Secondary | ICD-10-CM | POA: Insufficient documentation

## 2019-04-18 DIAGNOSIS — I771 Stricture of artery: Secondary | ICD-10-CM | POA: Diagnosis not present

## 2019-04-18 HISTORY — PX: VISCERAL ANGIOGRAPHY: CATH118276

## 2019-04-18 LAB — BASIC METABOLIC PANEL
Anion gap: 11 (ref 5–15)
BUN: 29 mg/dL — ABNORMAL HIGH (ref 8–23)
CO2: 24 mmol/L (ref 22–32)
Calcium: 8.3 mg/dL — ABNORMAL LOW (ref 8.9–10.3)
Chloride: 106 mmol/L (ref 98–111)
Creatinine, Ser: 1.09 mg/dL (ref 0.61–1.24)
GFR calc Af Amer: >60 mL/min (ref >60)
GFR calc non Af Amer: >60 mL/min (ref >60)
Glucose, Bld: 91 mg/dL (ref 70–99)
Potassium: 3.9 mmol/L (ref 3.5–5.1)
Sodium: 141 mmol/L (ref 135–145)

## 2019-04-18 LAB — CBC
HCT: 35.4 % — ABNORMAL LOW (ref 39.0–52.0)
HCT: 38.4 % — ABNORMAL LOW (ref 39.0–52.0)
Hemoglobin: 12 g/dL — ABNORMAL LOW (ref 13.0–17.0)
Hemoglobin: 13 g/dL (ref 13.0–17.0)
MCH: 29.7 pg (ref 26.0–34.0)
MCH: 29.4 pg (ref 26.0–34.0)
MCHC: 33.9 g/dL (ref 30.0–36.0)
MCHC: 33.9 g/dL (ref 30.0–36.0)
MCV: 87.6 fL (ref 80.0–100.0)
MCV: 86.9 fL (ref 80.0–100.0)
Platelets: 319 10*3/uL (ref 150–400)
Platelets: 318 10*3/uL (ref 150–400)
RBC: 4.04 MIL/uL — ABNORMAL LOW (ref 4.22–5.81)
RBC: 4.42 MIL/uL (ref 4.22–5.81)
RDW: 13.2 % (ref 11.5–15.5)
RDW: 13.1 % (ref 11.5–15.5)
WBC: 8.5 10*3/uL (ref 4.0–10.5)
WBC: 9.4 10*3/uL (ref 4.0–10.5)
nRBC: 0 % (ref 0.0–0.2)
nRBC: 0 % (ref 0.0–0.2)

## 2019-04-18 LAB — HEPARIN LEVEL (UNFRACTIONATED)
Heparin Unfractionated: 0.66 IU/mL (ref 0.30–0.70)
Heparin Unfractionated: 0.98 IU/mL — ABNORMAL HIGH (ref 0.30–0.70)

## 2019-04-18 LAB — SARS CORONAVIRUS 2 (TAT 6-24 HRS): SARS Coronavirus 2: NEGATIVE

## 2019-04-18 SURGERY — VISCERAL ANGIOGRAPHY
Anesthesia: Moderate Sedation

## 2019-04-18 MED ORDER — MEGESTROL ACETATE 20 MG PO TABS
40.0000 mg | ORAL_TABLET | Freq: Every day | ORAL | Status: DC
  Administered 2019-04-18 – 2019-04-19 (×2): 40 mg via ORAL
  Filled 2019-04-18 (×2): qty 2

## 2019-04-18 MED ORDER — ONDANSETRON HCL 4 MG/2ML IJ SOLN: 4.0000 mg | Freq: Four times a day (QID) | INTRAMUSCULAR | Status: DC | PRN

## 2019-04-18 MED ORDER — SODIUM CHLORIDE 0.9 % IV SOLN: INTRAVENOUS | Status: DC

## 2019-04-18 MED ORDER — DIPHENHYDRAMINE HCL 50 MG/ML IJ SOLN: 50.0000 mg | Freq: Once | INTRAMUSCULAR | Status: DC | PRN

## 2019-04-18 MED ORDER — ASPIRIN EC 81 MG PO TBEC
81.0000 mg | DELAYED_RELEASE_TABLET | Freq: Every day | ORAL | Status: DC
  Administered 2019-04-19: 10:00:00 81 mg via ORAL
  Filled 2019-04-18: qty 1

## 2019-04-18 MED ORDER — CEFAZOLIN SODIUM-DEXTROSE 2-4 GM/100ML-% IV SOLN
2.0000 g | Freq: Once | INTRAVENOUS | Status: AC
  Administered 2019-04-18: 13:00:00 2 g via INTRAVENOUS
  Filled 2019-04-18: qty 100

## 2019-04-18 MED ORDER — CEFAZOLIN SODIUM-DEXTROSE 2-4 GM/100ML-% IV SOLN
INTRAVENOUS | Status: AC
  Filled 2019-04-18: qty 100

## 2019-04-18 MED ORDER — TRAMADOL HCL 50 MG PO TABS: 50.0000 mg | ORAL_TABLET | Freq: Four times a day (QID) | ORAL | Status: DC | PRN

## 2019-04-18 MED ORDER — MIDAZOLAM HCL 2 MG/ML PO SYRP
8.0000 mg | ORAL_SOLUTION | Freq: Once | ORAL | Status: DC | PRN
  Filled 2019-04-18: qty 4

## 2019-04-18 MED ORDER — ACETAMINOPHEN 650 MG RE SUPP: 650.0000 mg | Freq: Four times a day (QID) | RECTAL | Status: DC | PRN

## 2019-04-18 MED ORDER — FENTANYL CITRATE (PF) 100 MCG/2ML IJ SOLN
INTRAMUSCULAR | Status: AC
  Filled 2019-04-18: qty 2

## 2019-04-18 MED ORDER — MIDAZOLAM HCL 5 MG/5ML IJ SOLN
INTRAMUSCULAR | Status: AC
  Filled 2019-04-18: qty 5

## 2019-04-18 MED ORDER — CLOPIDOGREL BISULFATE 75 MG PO TABS
75.0000 mg | ORAL_TABLET | Freq: Every day | ORAL | Status: DC
  Administered 2019-04-19: 75 mg via ORAL
  Filled 2019-04-18: qty 1

## 2019-04-18 MED ORDER — LISINOPRIL-HYDROCHLOROTHIAZIDE 20-12.5 MG PO TABS: 1.0000 | ORAL_TABLET | Freq: Every day | ORAL | Status: DC

## 2019-04-18 MED ORDER — PANTOPRAZOLE SODIUM 40 MG PO TBEC
40.0000 mg | DELAYED_RELEASE_TABLET | Freq: Every day | ORAL | Status: DC
  Administered 2019-04-19: 40 mg via ORAL
  Filled 2019-04-18: qty 1

## 2019-04-18 MED ORDER — FENTANYL CITRATE (PF) 100 MCG/2ML IJ SOLN
INTRAMUSCULAR | Status: DC | PRN
  Administered 2019-04-18: 50 ug via INTRAVENOUS
  Administered 2019-04-18: 25 ug via INTRAVENOUS

## 2019-04-18 MED ORDER — HYDROCHLOROTHIAZIDE 12.5 MG PO CAPS
12.5000 mg | ORAL_CAPSULE | Freq: Every day | ORAL | Status: DC
  Filled 2019-04-18: qty 1

## 2019-04-18 MED ORDER — FAMOTIDINE 20 MG PO TABS: 40.0000 mg | ORAL_TABLET | Freq: Once | ORAL | Status: DC | PRN

## 2019-04-18 MED ORDER — HYDROMORPHONE HCL 1 MG/ML IJ SOLN: 1.0000 mg | Freq: Once | INTRAMUSCULAR | Status: DC | PRN

## 2019-04-18 MED ORDER — HEPARIN SODIUM (PORCINE) 1000 UNIT/ML IJ SOLN
INTRAMUSCULAR | Status: AC
  Filled 2019-04-18: qty 1

## 2019-04-18 MED ORDER — SODIUM CHLORIDE 0.9 % IV SOLN
INTRAVENOUS | Status: DC
  Administered 2019-04-18 (×2): via INTRAVENOUS

## 2019-04-18 MED ORDER — IODIXANOL 320 MG/ML IV SOLN
INTRAVENOUS | Status: DC | PRN
  Administered 2019-04-18: 75 mL via INTRA_ARTERIAL

## 2019-04-18 MED ORDER — LISINOPRIL 20 MG PO TABS
20.0000 mg | ORAL_TABLET | Freq: Every day | ORAL | Status: DC
  Filled 2019-04-18: qty 1

## 2019-04-18 MED ORDER — ACETAMINOPHEN 325 MG PO TABS: 650.0000 mg | ORAL_TABLET | Freq: Four times a day (QID) | ORAL | Status: DC | PRN

## 2019-04-18 MED ORDER — METHYLPREDNISOLONE SODIUM SUCC 125 MG IJ SOLR: 125.0000 mg | Freq: Once | INTRAMUSCULAR | Status: DC | PRN

## 2019-04-18 MED ORDER — ASPIRIN EC 81 MG PO TBEC: 81.0000 mg | DELAYED_RELEASE_TABLET | Freq: Every day | ORAL | Status: DC

## 2019-04-18 MED ORDER — HEPARIN SODIUM (PORCINE) 1000 UNIT/ML IJ SOLN
INTRAMUSCULAR | Status: DC | PRN
  Administered 2019-04-18: 4000 [IU] via INTRAVENOUS

## 2019-04-18 MED ORDER — ONDANSETRON HCL 4 MG PO TABS
4.0000 mg | ORAL_TABLET | Freq: Four times a day (QID) | ORAL | Status: DC | PRN
  Filled 2019-04-18: qty 1

## 2019-04-18 MED ORDER — MIDAZOLAM HCL 2 MG/2ML IJ SOLN
INTRAMUSCULAR | Status: DC | PRN
  Administered 2019-04-18: 1 mg via INTRAVENOUS
  Administered 2019-04-18: 2 mg via INTRAVENOUS

## 2019-04-18 MED ORDER — SIMVASTATIN 20 MG PO TABS
20.0000 mg | ORAL_TABLET | Freq: Every day | ORAL | Status: DC
  Administered 2019-04-18 – 2019-04-19 (×2): 20 mg via ORAL
  Filled 2019-04-18 (×2): qty 1

## 2019-04-18 MED ORDER — LEVOTHYROXINE SODIUM 100 MCG PO TABS
100.0000 ug | ORAL_TABLET | Freq: Every day | ORAL | Status: DC
  Administered 2019-04-19: 07:00:00 100 ug via ORAL
  Filled 2019-04-18: qty 1

## 2019-04-18 SURGICAL SUPPLY — 24 items
BALLN DORADO 6X20X80 (BALLOONS) ×2
BALLN LUTONIX  018 4X60X130 (BALLOONS) ×1
BALLN LUTONIX 018 4X60X130 (BALLOONS) ×1
BALLN STERLING SL OTW 3X80X150 (BALLOONS) ×2
BALLOON DORADO 6X20X80 (BALLOONS) ×1 IMPLANT
BALLOON LUTONIX 018 4X60X130 (BALLOONS) ×1 IMPLANT
BALLOON STRLNG SL OTW 3X80X150 (BALLOONS) ×1 IMPLANT
CATH CXI SUPP 2.6F 150 ST (CATHETERS) ×2 IMPLANT
CATH PIG 70CM (CATHETERS) ×2 IMPLANT
CATH VS15FR (CATHETERS) ×2 IMPLANT
DEVICE PRESTO INFLATION (MISCELLANEOUS) ×2 IMPLANT
DEVICE STARCLOSE SE CLOSURE (Vascular Products) ×2 IMPLANT
DEVICE TORQUE .025-.038 (MISCELLANEOUS) ×2 IMPLANT
GLIDECATH 4FR STR (CATHETERS) ×2 IMPLANT
GLIDEWIRE STIFF .35X180X3 HYDR (WIRE) ×2 IMPLANT
GUIDEWIRE PFTE-COATED .018X300 (WIRE) ×2 IMPLANT
PACK ANGIOGRAPHY (CUSTOM PROCEDURE TRAY) ×2 IMPLANT
SHEATH BRITE TIP 5FRX11 (SHEATH) ×2 IMPLANT
STENT LIFESTREAM 6X37X80 (Permanent Stent) ×2 IMPLANT
SYR MEDRAD MARK 7 150ML (SYRINGE) ×2 IMPLANT
TUBING CONTRAST HIGH PRESS 72 (TUBING) ×2 IMPLANT
WIRE G V18X300CM (WIRE) ×2 IMPLANT
WIRE J 3MM .035X145CM (WIRE) ×4 IMPLANT
WIRE MAGIC TORQUE 260C (WIRE) ×2 IMPLANT

## 2019-04-18 NOTE — Consult Note (Signed)
ANTICOAGULATION CONSULT NOTE -  Pharmacy Consult for Hepain Indication: mesenteric ischemia  No Known Allergies  Patient Measurements:   Heparin Dosing Weight: 47.6 kg  Vital Signs: Temp: 97.7 F (36.5 C) (11/10 2024) Temp Source: Oral (11/10 2024) BP: 127/56 (11/10 2024) Pulse Rate: 71 (11/10 2024)  Labs: Recent Labs    04/17/19 1634 04/18/19 0235  HGB 12.9* 12.0*  HCT 37.5* 35.4*  PLT 385 319  APTT 32  --   LABPROT 14.8  --   INR 1.2  --   HEPARINUNFRC  --  0.66  CREATININE 1.33* 1.09    Estimated Creatinine Clearance: 35.2 mL/min (by C-G formula based on SCr of 1.09 mg/dL).   Medical History: Past Medical History:  Diagnosis Date  . Allergy   . Hyperlipidemia   . Hypertension   . Thyroid disease     Medications:  No anticoagulation prior to admission per prior to admission medications  Assessment: Patient is an 84 y/o M with history of weight loss admitted for evaluation of chronic mesenteric ischemia. 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 Vascular surgery with plan for intervention 11/11 in the AM. Pharmacy has been consulted for heparin infusion for mesenteric ischemia. Baseline H&H and Plt within normal limits. Baseline PT-INR and aPTT within normal limits.   11/11 @ 0235 HL = 0.66, therapeutic x 1  Goal of Therapy:  Heparin level 0.3-0.7 units/ml Monitor platelets by anticoagulation protocol: Yes   Plan:  -Continue Heparin infusion at 800 units/hr -Recheck Heparin level in 8 hours to confirm -Daily CBC per protocol  Hart Robinsons A  04/18/2019,3:24 AM

## 2019-04-18 NOTE — Op Note (Signed)
[facility] Percutaneous Study/Intervention Procedural Note   Date: 04/18/2019  Surgeon(s): Leotis Pain, MD  Assistants: none  Pre-operative Diagnosis: 1.  Chronic mesenteric ischemia 2.  Celiac artery and SMA stenosis   Post-operative diagnosis: Same  Procedure(s) Performed: 1. Ultrasound guidance for vascular access right femoral artery  2. Catheter placement into celiac artery and superior mesenteric artery from right femoral approach 3. Aortogram and selective angiogram of the celiac artery and superior mesenteric artery  4.  Percutaneous transluminal angioplasty of the superior mesenteric artery with a 3 mm diameter x 60 mm length angioplasty balloon 5. Stent to the SMA with 6 mm diameter x 37 mm length balloon expandable Lifestream stent 6. StarClose closure device right femoral artery  Contrast: 75  Fluoro time: 8.9  EBL: 5 cc  Anesthesia: Local with moderate conscious sedation for approximately 45 minutes with 3 mg of Versed and 75 mcg of fentanyl  Indications: Patient is a 84 y.o. male who has symptoms consistent with mesenteric ischemia. 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 The patient is brought in for angiography for further evaluation and potential treatment. Risks and benefits are discussed and informed consent is obtained  Procedure: The patient was identified and appropriate procedural time out was performed. The patient was then placed supine on the table and prepped and draped in the usual sterile fashion. Ultrasound was used to evaluate the right common femoral artery. It was patent . A digital ultrasound image was acquired. A Seldinger needle was used to access the right common femoral artery under direct ultrasound guidance and a permanent image was performed. A 0.035  J wire was advanced without resistance and a 5Fr sheath was placed. Pigtail catheter was placed into the aorta and an AP aortogram was performed. This demonstrated relatively normal aorta and renal arteries.  The origins of the visceral vessels could not be well seen. We transitioned to the lateral projection to image the celiac and SMA. The lateral image demonstrated what appeared to be severe disease of the celiac artery and superior mesenteric artery.  I then used a V S1 catheter to selectively cannulate first the celiac artery and perform selective imaging.  There was indeed a short segment high-grade stenosis in the 85 to 90% range in the proximal celiac artery with poststenotic dilatation and good filling beyond this lesion.  I then turned my attention to the SMA.  The V S1 catheter was used to selectively cannulate the SMA and performed imaging.  This showed a very severe long stenosis in the 90 to 95% range over about 2-1/2 to 3 cm.  This started just beyond the origin of the SMA. The patient was given 4000 units of IV heparin. Based on his symptoms and these findings, I elected to treat the SMA to try to improve the patient's clinical course. I crossed the lesion with minimal difficulty with a Glidewire but the V S1 catheter would not cross the lesion.  A glide catheter would actually not cross the lesion either due to the highly calcific tight nature of the lesion.  Then used a 0.018 advantage wire to cross the lesion and was able to get out across this with a 0.018 CXI catheter and exchanged for a V 18 wire. I used a 3 mm diameter x 6 mm length angioplasty balloon and inflated the balloon to 14 for one minute.  This was essentially predilatation to  allow larger catheter and stent placement.  Completion angiogram demonstrated residual high-grade stenosis which was expected, so I elected to proceed with stent placement.  I then exchanged for a Magic torque wire and a 6 [place] Ansell sheath was placed  into the SMA across the lesion.  I then used a 6 mm diameter x 37 mm length balloon expandable Lifestream stent to perform treatment of the SMA. I inflated the balloon to 12 atm.  The stent was placed on millimeters to back into the aorta and care was taken not to avoid the primary branches of the SMA and we stopped just proximal to this.  There was still some constraint and a 6 mm diameter by 2 cm length [place] balloon was inflated to 20 atm to relieve the constraint.  On completion angiogram following this, 5-10% residual stenosis was identified with brisk flow through the stent and out through the SMA branches. At this point, I elected to terminate the procedure. The diagnostic catheter was removed. StarClose closure device was deployed in usual fashion with excellent hemostatic result. The patient was taken to the recovery room in stable condition having tolerated the procedure well.     Findings:Aorta and renal arteries were patent without significant stenosis.  The celiac artery had a very high-grade short segment stenosis of about 85 to 90%.  The SMA had a very long severe stenosis in the 90 to 95% range over about 2-1/2 to 3 cm from just beyond the origin out the proximal SMA.  Disposition: Patient was taken to the recovery room in stable condition having tolerated the procedure well.  Complications: None  Leotis Pain 04/18/2019 2:07 PM  This note was created with Dragon Medical transcription system. Any errors in dictation are purely unintentional.

## 2019-04-18 NOTE — Progress Notes (Addendum)
Initial Nutrition Assessment  DOCUMENTATION CODES:   Underweight, Severe malnutrition in context of chronic illness  INTERVENTION:  Pt is at risk for refeeding syndrome. Monitor Magnesium,Phosphorus, potassium daily and replace as needed.  Once Diet Advances:  Ensure Enlive BID (each supplement provides 350 calories and 20 grams of protein)  Magic cup with meals (each supplement provides 250 calories and 9 grams of protein)  MVI daily   NUTRITION DIAGNOSIS:   Severe Malnutrition related to chronic illness(mesenteric ischemia) as evidenced by severe fat depletion, severe muscle depletion.   GOAL:   Patient will meet greater than or equal to 90% of their needs   MONITOR:   Supplement acceptance, Diet advancement, Weight trends, Skin, I & O's  REASON FOR ASSESSMENT:   Malnutrition Screening Tool    ASSESSMENT:   84 y.o. Male presenting with mesenteric ischemia. PMH significant of Diverticulitis, HTN, HLD, thyroid disease.  Pt was in good spirits, very emaciated. Pt c/o postprandial abdominal pain and diarrhea PTA. He explained that he has had several bouts of diarrhea since admission and does not wish to eat because "it goes right through." Pt. Endorses that he has not eaten anything for > 5 days other than sips of fluids and states, "I don't think I've lost weight, I know I've lost 30 pounds in a few months." Pt understands importance of meeting calorie and protein needs to heal post surgery and preserve muscle mass, expressed interest in chocolate supplements once he is permitted to eat PO. Pt explained that he has boost supplements at home and was accepting of instruction to try and drink two per day after discharge.  Per weight history, pt is -16% body weight in 9 months, which is concerning as he was already significantly underweight. Pt is scheduled for Visceral Angiography this morning.  Labs and medications reviewed.   NUTRITION - FOCUSED PHYSICAL EXAM:    Most  Recent Value  Orbital Region  Severe depletion  Upper Arm Region  Severe depletion  Thoracic and Lumbar Region  Moderate depletion  Buccal Region  Moderate depletion  Temple Region  Moderate depletion  Clavicle Bone Region  Severe depletion  Clavicle and Acromion Bone Region  Severe depletion  Scapular Bone Region  Severe depletion  Dorsal Hand  Moderate depletion  Patellar Region  Severe depletion  Anterior Thigh Region  Severe depletion  Posterior Calf Region  Moderate depletion  Edema (RD Assessment)  None  Hair  Reviewed  Eyes  Reviewed  Mouth  Reviewed  Skin  Reviewed  Nails  Reviewed       Diet Order:   Diet Order            Diet NPO time specified  Diet effective midnight              EDUCATION NEEDS:   Education needs have been addressed  Skin:  Skin Assessment: Skin Integrity Issues:  Last BM:  11/11  Height:   Ht Readings from Last 1 Encounters:  04/17/19 5\' 7"  (1.702 m)    Weight:   Wt Readings from Last 1 Encounters:  04/17/19 47.6 kg    Ideal Body Weight:  67.3 kg  BMI:  There is no height or weight on file to calculate BMI.  Estimated Nutritional Needs:   Kcal:  1500-1700  Protein:  75-85 grams  Fluid:  1.2-1.5 L   Meda Klinefelter, Dietetic Intern

## 2019-04-18 NOTE — H&P (Signed)
[facility] History & Physical Update  The patient was interviewed and re-examined.  The patient's previous History and Physical has been reviewed and is unchanged.  There is no change in the plan of care. We plan to proceed with the scheduled procedure.  Leotis Pain, MD  04/18/2019, 11:54 AM

## 2019-04-19 ENCOUNTER — Encounter: Payer: Self-pay | Admitting: Vascular Surgery

## 2019-04-19 DIAGNOSIS — K55059 Acute (reversible) ischemia of intestine, part and extent unspecified: Secondary | ICD-10-CM

## 2019-04-19 LAB — CBC
HCT: 31.7 % — ABNORMAL LOW (ref 39.0–52.0)
HCT: 35.2 % — ABNORMAL LOW (ref 39.0–52.0)
Hemoglobin: 10.6 g/dL — ABNORMAL LOW (ref 13.0–17.0)
Hemoglobin: 12.2 g/dL — ABNORMAL LOW (ref 13.0–17.0)
MCH: 29.4 pg (ref 26.0–34.0)
MCH: 29.8 pg (ref 26.0–34.0)
MCHC: 33.4 g/dL (ref 30.0–36.0)
MCHC: 34.7 g/dL (ref 30.0–36.0)
MCV: 88.1 fL (ref 80.0–100.0)
MCV: 85.9 fL (ref 80.0–100.0)
Platelets: 291 10*3/uL (ref 150–400)
Platelets: 303 10*3/uL (ref 150–400)
RBC: 3.6 MIL/uL — ABNORMAL LOW (ref 4.22–5.81)
RBC: 4.1 MIL/uL — ABNORMAL LOW (ref 4.22–5.81)
RDW: 13 % (ref 11.5–15.5)
RDW: 13 % (ref 11.5–15.5)
WBC: 8.7 10*3/uL (ref 4.0–10.5)
WBC: 10.2 10*3/uL (ref 4.0–10.5)
nRBC: 0 % (ref 0.0–0.2)
nRBC: 0 % (ref 0.0–0.2)

## 2019-04-19 LAB — BASIC METABOLIC PANEL
Anion gap: 5 (ref 5–15)
BUN: 22 mg/dL (ref 8–23)
CO2: 25 mmol/L (ref 22–32)
Calcium: 7.8 mg/dL — ABNORMAL LOW (ref 8.9–10.3)
Chloride: 109 mmol/L (ref 98–111)
Creatinine, Ser: 0.97 mg/dL (ref 0.61–1.24)
GFR calc Af Amer: >60 mL/min (ref >60)
GFR calc non Af Amer: >60 mL/min (ref >60)
Glucose, Bld: 100 mg/dL — ABNORMAL HIGH (ref 70–99)
Potassium: 4.3 mmol/L (ref 3.5–5.1)
Sodium: 139 mmol/L (ref 135–145)

## 2019-04-19 LAB — MAGNESIUM: Magnesium: 2.1 mg/dL (ref 1.7–2.4)

## 2019-04-19 LAB — PHOSPHORUS: Phosphorus: 2.2 mg/dL — ABNORMAL LOW (ref 2.5–4.6)

## 2019-04-19 MED ORDER — ENSURE ENLIVE PO LIQD
237.0000 mL | Freq: Two times a day (BID) | ORAL | Status: DC
  Administered 2019-04-19: 237 mL via ORAL

## 2019-04-19 MED ORDER — ADULT MULTIVITAMIN W/MINERALS CH: 1.0000 | ORAL_TABLET | Freq: Every day | ORAL | 3 refills | Status: DC

## 2019-04-19 MED ORDER — POTASSIUM & SODIUM PHOSPHATES 280-160-250 MG PO PACK
1.0000 | PACK | Freq: Two times a day (BID) | ORAL | Status: DC
  Filled 2019-04-19: qty 1

## 2019-04-19 MED ORDER — CLOPIDOGREL BISULFATE 75 MG PO TABS: 75.0000 mg | ORAL_TABLET | Freq: Every day | ORAL | 0 refills | Status: DC

## 2019-04-19 MED ORDER — ADULT MULTIVITAMIN W/MINERALS CH
1.0000 | ORAL_TABLET | Freq: Every day | ORAL | Status: DC
  Administered 2019-04-19: 12:00:00 1 via ORAL
  Filled 2019-04-19: qty 1

## 2019-04-19 MED ORDER — ENSURE ENLIVE PO LIQD: 237.0000 mL | Freq: Two times a day (BID) | ORAL | 12 refills | Status: DC

## 2019-04-19 MED ORDER — SODIUM CHLORIDE 0.9 % IV BOLUS
500.0000 mL | Freq: Once | INTRAVENOUS | Status: AC
  Administered 2019-04-19: 500 mL via INTRAVENOUS

## 2019-04-19 NOTE — Progress Notes (Signed)
[facility] Daily Progress Note   Subjective: 1 Day Post-Op: 1. Ultrasound guidance for vascular access right femoral artery  2. Catheter placement into celiac artery and superior mesenteric artery from right femoral approach 3. Aortogram and selective angiogram of the celiac artery and superior mesenteric artery             4.  Percutaneous transluminal angioplasty of the superior mesenteric artery with a 3 mm diameter x 60 mm length angioplasty balloon 5. Stent to the SMA with 6 mm diameter x 37 mm length balloon expandable Lifestream stent 6. StarClose closure device right femoral artery  Patient with small amount of blood in stool last night. No bowel movement yet this AM. Patient tolerating clear liquid diet.   Objective: Vitals:   04/19/19 0455 04/19/19 0500 04/19/19 0950 04/19/19 0953  BP: (!) 122/56  (!) 120/33 (!) 123/44  Pulse: 71  71   Resp: 20     Temp: (!) 97.4 F (36.3 C)  98.6 F (37 C)   TempSrc: Oral  Axillary   SpO2: 100%  95%   Weight:  39.7 kg    Height:        Intake/Output Summary (Last 24 hours) at 04/19/2019 1048 Last data filed at 04/19/2019 0126 Gross per 24 hour  Intake 1373.43 ml  Output 350 ml  Net 1023.43 ml   Physical Exam: A&Ox3, NAD CV: RRR Pulmonary: CTA Bilaterally Abdomen: Soft, Nontender, Nondistended, (+) Bowel Sounds Right Groin:  Dressing clean, dry and intact. No swelling or drainage noted.  Vascular:   Lower Extremity: Warm distally to toes, non-tender bilaterally   Laboratory: CBC    Component Value Date/Time   WBC 8.7 04/19/2019 0337   HGB 10.6 (L) 04/19/2019 0337   HCT 31.7 (L) 04/19/2019 0337   PLT 291 04/19/2019 0337   BMET    Component Value Date/Time   NA 139 04/19/2019 0337   NA 142 06/15/2018 1705   K 4.3 04/19/2019 0337   CL 109 04/19/2019 0337   CO2 25 04/19/2019 0337   GLUCOSE 100 (H) 04/19/2019 0337   BUN 22  04/19/2019 0337   BUN 15 06/15/2018 1705   CREATININE 0.97 04/19/2019 0337   CALCIUM 7.8 (L) 04/19/2019 0337   GFRNONAA >60 04/19/2019 0337   GFRAA >60 04/19/2019 0350   Assessment/Planning: The patient is an 84 year old male multiple medical issues who presented with chronic mesenteric ischemia status post mesenteric artery angioplasty and stent placement - POD#1 1) 2.5 gram drop in Hbg - patient is currently asymptomatic at this time however we will repeat a CBC at noon. 2) ASA / Plavix  3) advance diet as tolerated 4) out of bed / ambulation  Discussed with Dr. Ellis Parents Aviana Shevlin PA-C 04/19/2019 10:48 AM

## 2019-04-19 NOTE — Discharge Instructions (Signed)
Vascular Surgery Discharge Instructions 1) You may shower as of tomorrow. Keep groin clean and dry.

## 2019-04-19 NOTE — Progress Notes (Signed)
Edd Fabian to be D/C'd Home per MD order.  Discussed prescriptions and follow up appointments with the patient. Prescriptions given to patient, medication list explained in detail. Pt verbalized understanding.  Allergies as of 04/19/2019   No Known Allergies     Medication List    TAKE these medications   aspirin EC 81 MG tablet Take 81 mg by mouth daily.   cetirizine 10 MG tablet Commonly known as: ZYRTEC Take 0.5-1 tablets (5-10 mg total) by mouth at bedtime. May stop taking daily after 6 weeks.   clopidogrel 75 MG tablet Commonly known as: PLAVIX Take 1 tablet (75 mg total) by mouth daily. Start taking on: April 20, 2019   feeding supplement (ENSURE ENLIVE) Liqd Take 237 mLs by mouth 2 (two) times daily between meals.   levothyroxine 100 MCG tablet Commonly known as: SYNTHROID Take 100 mcg by mouth daily before breakfast.   lisinopril-hydrochlorothiazide 20-12.5 MG tablet Commonly known as: ZESTORETIC Take 1 tablet by mouth daily.   megestrol 40 MG tablet Commonly known as: MEGACE Take 40 mg by mouth daily.   multivitamin with minerals Tabs tablet Take 1 tablet by mouth daily. Start taking on: April 20, 2019   omeprazole 40 MG capsule Commonly known as: PRILOSEC Take 1 capsule (40 mg total) by mouth daily.   simvastatin 20 MG tablet Commonly known as: ZOCOR Take 20 mg by mouth daily.   TUMS PO Take by mouth as needed.   Vitamin D3 50 MCG (2000 UT) Tabs Take by mouth daily.       Vitals:   04/19/19 1211 04/19/19 1245  BP: 119/64 (!) 156/56  Pulse: 73 75  Resp:  20  Temp:  98.3 F (36.8 C)  SpO2: 100% 99%    Skin clean, dry and intact without evidence of skin break down, no evidence of skin tears noted. IV catheter discontinued intact. Site without signs and symptoms of complications. Dressing and pressure applied. Pt denies pain at this time. No complaints noted.  An After Visit Summary was printed and given to the  patient. Patient escorted via [place], and D/C home via private auto.  Marry Guan

## 2019-04-19 NOTE — Discharge Summary (Signed)
[facility]    Discharge Summary  Patient ID:  Jeremy Merritt MRN: [id number] DOB/AGE: 82-Dec-1936 82 y.o.  Admit date: 04/17/2019 Discharge date: 04/19/2019 Date of Surgery: 04/18/2019 Surgeon: Surgeon(s): Dew, Marlow Baars, MD  Admission Diagnosis: Mestenteric Ischemia  Discharge Diagnoses:  Mestenteric Ischemia  Secondary Diagnoses: Past Medical History:  Diagnosis Date  . Allergy   . Hyperlipidemia   . Hypertension   . Thyroid disease    Procedure(s): VISCERAL ANGIOGRAPHY  Discharged Condition: Good  HPI / Hospital Course:  Patient is a 84 y.o. male who has symptoms consistent with mesenteric ischemia. 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 The patient is brought in for angiography for further evaluation and potential treatment. Risks and benefits are discussed and informed consent is obtained. On 04/18/19, the patient underwent:  1. Ultrasound guidance for vascular access right femoral artery  2. Catheter placement into celiac artery and superior mesenteric artery from right femoral approach 3. Aortogram and selective angiogram of the celiac artery and superior mesenteric artery             4.  Percutaneous transluminal angioplasty of the superior mesenteric artery with a 3 mm diameter x 60 mm length angioplasty balloon 5. Stent to the SMA with 6 mm diameter x 37 mm length balloon expandable Lifestream stent 6. StarClose closure device right femoral artery  The patient tolerated the procedure well was transferred from recovery room to the surgical floor without issue.  Patient was admitted for observation overnight.  Patient had to bloody bowel movements while in house.  No bloody bowel movements on the day of discharge.  During the patient's brief inpatient stay, his diet was advanced, he was  urinating without complication, pain was controlled with use of p.o. medication and he was ambulating at baseline.  Day of discharge, the patient was afebrile with stable vital signs.  Unremarkable physical exam.  Physical exam:  A&Ox3, NAD CV: RRR Pulmonary: CTA Bilaterally Abdomen: Soft, Nontender, Nondistended, (+) Bowel Sounds Right Groin:             Dressing clean, dry and intact. No swelling or drainage noted.  Vascular:              Lower Extremity: Warm distally to toes, non-tender bilaterally  Labs as below  Complications: None  Consults:  Dietician  Significant Diagnostic Studies: CBC Lab Results  Component Value Date   WBC 10.2 04/19/2019   HGB 12.2 (L) 04/19/2019   HCT 35.2 (L) 04/19/2019   MCV 85.9 04/19/2019   PLT 303 04/19/2019   BMET    Component Value Date/Time   NA 139 04/19/2019 0337   NA 142 06/15/2018 1705   K 4.3 04/19/2019 0337   CL 109 04/19/2019 0337   CO2 25 04/19/2019 0337   GLUCOSE 100 (H) 04/19/2019 0337   BUN 22 04/19/2019 0337   BUN 15 06/15/2018 1705   CREATININE 0.97 04/19/2019 0337   CALCIUM 7.8 (L) 04/19/2019 0337   GFRNONAA >60 04/19/2019 0337   GFRAA >60 04/19/2019 0337   COAG Lab Results  Component Value Date   INR 1.2 04/17/2019   Disposition:  Discharge to :Home  Allergies as of 04/19/2019   No Known Allergies     Medication List    TAKE these medications   aspirin EC 81 MG tablet Take 81 mg by mouth daily.   cetirizine 10 MG tablet  Commonly known as: ZYRTEC Take 0.5-1 tablets (5-10 mg total) by mouth at bedtime. May stop taking daily after 6 weeks.   clopidogrel 75 MG tablet Commonly known as: PLAVIX Take 1 tablet (75 mg total) by mouth daily. Start taking on: April 20, 2019   feeding supplement (ENSURE ENLIVE) Liqd Take 237 mLs by mouth 2 (two) times daily between meals.   levothyroxine 100 MCG tablet Commonly known as: SYNTHROID Take 100 mcg by mouth daily before breakfast.    lisinopril-hydrochlorothiazide 20-12.5 MG tablet Commonly known as: ZESTORETIC Take 1 tablet by mouth daily.   megestrol 40 MG tablet Commonly known as: MEGACE Take 40 mg by mouth daily.   multivitamin with minerals Tabs tablet Take 1 tablet by mouth daily. Start taking on: April 20, 2019   omeprazole 40 MG capsule Commonly known as: PRILOSEC Take 1 capsule (40 mg total) by mouth daily.   simvastatin 20 MG tablet Commonly known as: ZOCOR Take 20 mg by mouth daily.   TUMS PO Take by mouth as needed.   Vitamin D3 50 MCG (2000 UT) Tabs Take by mouth daily.      Verbal and written Discharge instructions given to the patient. Wound care per Discharge AVS Follow-up Information    Dew, Erskine Squibb, MD Follow up in 2 week(s).   Specialties: Vascular Surgery, Radiology, Interventional Cardiology Why: Can see Dew or Arna Medici. Will need mesenteric angiogram with visit.  Contact information: [place] [address] [phone]          Signed: Sela Hua, PA-C  04/19/2019, 12:34 PM

## 2019-04-23 ENCOUNTER — Other Ambulatory Visit: Payer: Self-pay | Admitting: Gastroenterology

## 2019-04-23 DIAGNOSIS — R194 Change in bowel habit: Secondary | ICD-10-CM

## 2019-04-23 DIAGNOSIS — R634 Abnormal weight loss: Secondary | ICD-10-CM

## 2019-04-23 DIAGNOSIS — K6289 Other specified diseases of anus and rectum: Secondary | ICD-10-CM

## 2019-04-25 ENCOUNTER — Encounter: Payer: Self-pay | Admitting: Emergency Medicine

## 2019-04-25 ENCOUNTER — Telehealth (INDEPENDENT_AMBULATORY_CARE_PROVIDER_SITE_OTHER): Payer: Self-pay

## 2019-04-25 ENCOUNTER — Inpatient Hospital Stay
Admission: EM | Admit: 2019-04-25 | Discharge: 2019-04-29 | DRG: 393 | Disposition: A | Discharge status: Home / Self Care | Payer: Medicare Other | Attending: Internal Medicine | Admitting: Internal Medicine

## 2019-04-25 ENCOUNTER — Other Ambulatory Visit: Payer: Self-pay

## 2019-04-25 DIAGNOSIS — Z20828 Contact with and (suspected) exposure to other viral communicable diseases: Secondary | ICD-10-CM | POA: Diagnosis present

## 2019-04-25 DIAGNOSIS — Z79890 Hormone replacement therapy (postmenopausal): Secondary | ICD-10-CM

## 2019-04-25 DIAGNOSIS — K219 Gastro-esophageal reflux disease without esophagitis: Secondary | ICD-10-CM | POA: Diagnosis present

## 2019-04-25 DIAGNOSIS — E785 Hyperlipidemia, unspecified: Secondary | ICD-10-CM | POA: Diagnosis present

## 2019-04-25 DIAGNOSIS — L89151 Pressure ulcer of sacral region, stage 1: Secondary | ICD-10-CM | POA: Diagnosis present

## 2019-04-25 DIAGNOSIS — J302 Other seasonal allergic rhinitis: Secondary | ICD-10-CM | POA: Diagnosis present

## 2019-04-25 DIAGNOSIS — I10 Essential (primary) hypertension: Secondary | ICD-10-CM

## 2019-04-25 DIAGNOSIS — K922 Gastrointestinal hemorrhage, unspecified: Secondary | ICD-10-CM | POA: Diagnosis present

## 2019-04-25 DIAGNOSIS — E039 Hypothyroidism, unspecified: Secondary | ICD-10-CM | POA: Diagnosis present

## 2019-04-25 DIAGNOSIS — K625 Hemorrhage of anus and rectum: Secondary | ICD-10-CM | POA: Diagnosis not present

## 2019-04-25 DIAGNOSIS — K551 Chronic vascular disorders of intestine: Secondary | ICD-10-CM | POA: Diagnosis present

## 2019-04-25 DIAGNOSIS — Z7902 Long term (current) use of antithrombotics/antiplatelets: Secondary | ICD-10-CM

## 2019-04-25 DIAGNOSIS — E43 Unspecified severe protein-calorie malnutrition: Secondary | ICD-10-CM | POA: Diagnosis present

## 2019-04-25 DIAGNOSIS — K5730 Diverticulosis of large intestine without perforation or abscess without bleeding: Secondary | ICD-10-CM | POA: Diagnosis present

## 2019-04-25 DIAGNOSIS — Z681 Body mass index (BMI) 19 or less, adult: Secondary | ICD-10-CM

## 2019-04-25 DIAGNOSIS — R64 Cachexia: Secondary | ICD-10-CM | POA: Diagnosis present

## 2019-04-25 DIAGNOSIS — R531 Weakness: Secondary | ICD-10-CM

## 2019-04-25 DIAGNOSIS — K626 Ulcer of anus and rectum: Principal | ICD-10-CM | POA: Diagnosis present

## 2019-04-25 DIAGNOSIS — R1010 Upper abdominal pain, unspecified: Secondary | ICD-10-CM

## 2019-04-25 DIAGNOSIS — K319 Disease of stomach and duodenum, unspecified: Secondary | ICD-10-CM | POA: Diagnosis present

## 2019-04-25 DIAGNOSIS — D62 Acute posthemorrhagic anemia: Secondary | ICD-10-CM | POA: Diagnosis present

## 2019-04-25 DIAGNOSIS — Z79899 Other long term (current) drug therapy: Secondary | ICD-10-CM | POA: Diagnosis not present

## 2019-04-25 DIAGNOSIS — Z538 Procedure and treatment not carried out for other reasons: Secondary | ICD-10-CM | POA: Diagnosis present

## 2019-04-25 DIAGNOSIS — Z7982 Long term (current) use of aspirin: Secondary | ICD-10-CM

## 2019-04-25 DIAGNOSIS — L8990 Pressure ulcer of unspecified site, unspecified stage: Secondary | ICD-10-CM | POA: Insufficient documentation

## 2019-04-25 LAB — URINALYSIS, COMPLETE (UACMP) WITH MICROSCOPIC
Bacteria, UA: NONE SEEN
Bilirubin Urine: NEGATIVE
Glucose, UA: NEGATIVE mg/dL
Hgb urine dipstick: NEGATIVE
Ketones, ur: NEGATIVE mg/dL
Nitrite: NEGATIVE
Protein, ur: NEGATIVE mg/dL
Specific Gravity, Urine: 1.017 (ref 1.005–1.030)
Squamous Epithelial / HPF: NONE SEEN (ref 0–5)
pH: 5 (ref 5.0–8.0)

## 2019-04-25 LAB — COMPREHENSIVE METABOLIC PANEL
ALT: 29 U/L (ref 0–44)
AST: 37 U/L (ref 15–41)
Albumin: 2.8 g/dL — ABNORMAL LOW (ref 3.5–5.0)
Alkaline Phosphatase: 87 U/L (ref 38–126)
Anion gap: 12 (ref 5–15)
BUN: 27 mg/dL — ABNORMAL HIGH (ref 8–23)
CO2: 20 mmol/L — ABNORMAL LOW (ref 22–32)
Calcium: 8.1 mg/dL — ABNORMAL LOW (ref 8.9–10.3)
Chloride: 106 mmol/L (ref 98–111)
Creatinine, Ser: 0.94 mg/dL (ref 0.61–1.24)
GFR calc Af Amer: >60 mL/min (ref >60)
GFR calc non Af Amer: >60 mL/min (ref >60)
Glucose, Bld: 117 mg/dL — ABNORMAL HIGH (ref 70–99)
Potassium: 3.7 mmol/L (ref 3.5–5.1)
Sodium: 138 mmol/L (ref 135–145)
Total Bilirubin: 0.5 mg/dL (ref 0.3–1.2)
Total Protein: 6 g/dL — ABNORMAL LOW (ref 6.5–8.1)

## 2019-04-25 LAB — CBC
HCT: 34.1 % — ABNORMAL LOW (ref 39.0–52.0)
Hemoglobin: 11.4 g/dL — ABNORMAL LOW (ref 13.0–17.0)
MCH: 30.1 pg (ref 26.0–34.0)
MCHC: 33.4 g/dL (ref 30.0–36.0)
MCV: 90 fL (ref 80.0–100.0)
Platelets: 383 10*3/uL (ref 150–400)
RBC: 3.79 MIL/uL — ABNORMAL LOW (ref 4.22–5.81)
RDW: 14.1 % (ref 11.5–15.5)
WBC: 9.3 10*3/uL (ref 4.0–10.5)
nRBC: 0 % (ref 0.0–0.2)

## 2019-04-25 LAB — TYPE AND SCREEN
ABO/RH(D): O POS
Antibody Screen: NEGATIVE

## 2019-04-25 LAB — PROTIME-INR
INR: 1.1 (ref 0.8–1.2)
Prothrombin Time: 13.6 seconds (ref 11.4–15.2)

## 2019-04-25 LAB — LIPASE, BLOOD: Lipase: 21 U/L (ref 11–51)

## 2019-04-25 MED ORDER — BISACODYL 5 MG PO TBEC
5.0000 mg | DELAYED_RELEASE_TABLET | Freq: Every day | ORAL | Status: DC | PRN
  Filled 2019-04-25: qty 1

## 2019-04-25 MED ORDER — PANTOPRAZOLE SODIUM 40 MG IV SOLR
40.0000 mg | Freq: Two times a day (BID) | INTRAVENOUS | Status: DC
  Administered 2019-04-26 – 2019-04-28 (×5): 40 mg via INTRAVENOUS
  Filled 2019-04-25 (×7): qty 40

## 2019-04-25 MED ORDER — ACETAMINOPHEN 325 MG PO TABS: 650.0000 mg | ORAL_TABLET | Freq: Four times a day (QID) | ORAL | Status: DC | PRN

## 2019-04-25 MED ORDER — LACTATED RINGERS IV SOLN
INTRAVENOUS | Status: DC
  Administered 2019-04-26 – 2019-04-27 (×2): via INTRAVENOUS

## 2019-04-25 MED ORDER — SODIUM CHLORIDE 0.9 % IV BOLUS
1000.0000 mL | Freq: Once | INTRAVENOUS | Status: AC
  Administered 2019-04-25: 1000 mL via INTRAVENOUS

## 2019-04-25 MED ORDER — ACETAMINOPHEN 650 MG RE SUPP: 650.0000 mg | Freq: Four times a day (QID) | RECTAL | Status: DC | PRN

## 2019-04-25 MED ORDER — POLYETHYLENE GLYCOL 3350 17 G PO PACK
17.0000 g | PACK | Freq: Every day | ORAL | Status: DC | PRN
  Filled 2019-04-25: qty 1

## 2019-04-25 MED ORDER — PANTOPRAZOLE SODIUM 40 MG IV SOLR
40.0000 mg | Freq: Once | INTRAVENOUS | Status: AC
  Administered 2019-04-25: 40 mg via INTRAVENOUS
  Filled 2019-04-25: qty 40

## 2019-04-25 MED ORDER — TRAZODONE HCL 50 MG PO TABS
50.0000 mg | ORAL_TABLET | Freq: Every evening | ORAL | Status: DC | PRN
  Filled 2019-04-25: qty 1

## 2019-04-25 MED ORDER — ONDANSETRON HCL 4 MG/2ML IJ SOLN: 4.0000 mg | Freq: Four times a day (QID) | INTRAMUSCULAR | Status: DC | PRN

## 2019-04-25 MED ORDER — MAGNESIUM CITRATE PO SOLN
1.0000 | Freq: Once | ORAL | Status: DC | PRN
  Filled 2019-04-25: qty 296

## 2019-04-25 MED ORDER — SODIUM CHLORIDE 0.9% FLUSH
3.0000 mL | Freq: Two times a day (BID) | INTRAVENOUS | Status: DC
  Administered 2019-04-25 – 2019-04-29 (×7): 3 mL via INTRAVENOUS

## 2019-04-25 MED ORDER — ONDANSETRON HCL 4 MG PO TABS
4.0000 mg | ORAL_TABLET | Freq: Four times a day (QID) | ORAL | Status: DC | PRN
  Filled 2019-04-25: qty 1

## 2019-04-25 NOTE — ED Provider Notes (Signed)
[facility] Emergency Department Provider Note  ____________________________________________  Time seen: Approximately 4:18 PM  I have reviewed the triage vital signs and the nursing notes.   HISTORY  Chief Complaint Generalized weakness   HPI Jeremy Merritt is a 84 y.o. male with a history of hyperlipidemia and hypertension who comes to the ED complaining of black diarrhea .  He reports that the diarrhea has been ongoing for the past couple of weeks.  He also recently had angiogram and stenting of the SMA due to chronic mesenteric ischemia.  He does note that his appetite is slightly improved afterward, but he still has poor appetite and reports at least a 30 pound weight loss over the last several months.  No NSAID or steroid or alcohol use.  No blood thinners.     Past Medical History:  Diagnosis Date  . Allergy   . Hyperlipidemia   . Hypertension   . Thyroid disease      Patient Active Problem List   Diagnosis Date Noted  . Protein-calorie malnutrition, severe 04/18/2019  . Essential hypertension 04/17/2019  . Hyperlipidemia 04/17/2019  . Loss of weight 04/17/2019  . Mesenteric ischemia ([place]) 04/17/2019  . Chronic mesenteric ischemia ([place]) 10/17/2018  . History of diverticulitis 10/17/2018  . Chronic abdominal pain 10/17/2018  . Abdominal pain, LLQ 09/21/2018  . Diverticulitis large intestine w/o perforation or abscess w/bleeding 09/21/2018  . Weight loss, non-intentional 09/21/2018  . Upper abdominal pain 06/15/2018  . Early satiety 06/15/2018  . Abdominal fullness 06/15/2018  . Constipation 06/15/2018     Past Surgical History:  Procedure Laterality Date  . INGUINAL HERNIA REPAIR    . VISCERAL ANGIOGRAPHY N/A 04/18/2019   Procedure: VISCERAL ANGIOGRAPHY;  Surgeon: Algernon Huxley, MD;  Location: [facility];  Service: Cardiovascular;  Laterality: N/A;     Prior to Admission medications   Medication Sig Start Date End  Date Taking? Authorizing Provider  aspirin EC 81 MG tablet Take 81 mg by mouth daily.    [provider]  Calcium Carbonate Antacid (TUMS PO) Take by mouth as needed.    [provider]  cetirizine (ZYRTEC) 10 MG tablet Take 0.5-1 tablets (5-10 mg total) by mouth at bedtime. May stop taking daily after 6 weeks. 10/14/16   Tenna Delaine D, PA-C  Cholecalciferol (VITAMIN D3) 2000 units TABS Take by mouth daily.    [provider]  clopidogrel (PLAVIX) 75 MG tablet Take 1 tablet (75 mg total) by mouth daily. 04/20/19   Stegmayer, Joelene Millin A, PA-C  feeding supplement, ENSURE ENLIVE, (ENSURE ENLIVE) LIQD Take 237 mLs by mouth 2 (two) times daily between meals. 04/19/19   Stegmayer, Janalyn Harder, PA-C  levothyroxine (SYNTHROID, LEVOTHROID) 100 MCG tablet Take 100 mcg by mouth daily before breakfast.    [provider]  lisinopril-hydrochlorothiazide (PRINZIDE,ZESTORETIC) 20-12.5 MG tablet Take 1 tablet by mouth daily.    [provider]  megestrol (MEGACE) 40 MG tablet Take 40 mg by mouth daily. 03/27/19   [provider]  Multiple Vitamin (MULTIVITAMIN WITH MINERALS) TABS tablet Take 1 tablet by mouth daily. 04/20/19   Stegmayer, Janalyn Harder, PA-C  omeprazole (PRILOSEC) 40 MG capsule Take 1 capsule (40 mg total) by mouth daily. 06/15/18   Horald Pollen, MD  simvastatin (ZOCOR) 20 MG tablet Take 20 mg by mouth daily.    [provider]     Allergies Patient has no known allergies.   Family History  Problem Relation Age  of Onset  . Other Mother        died of old age  . Other Father        Black lung  . Thyroid cancer Sister   . Kidney failure Brother        on dialysis  . Colon cancer Neg Hx   . Liver cancer Neg Hx   . Stomach cancer Neg Hx     Social History Social History   Tobacco Use  . Smoking status: Never Smoker  . Smokeless tobacco: Never Used  Substance Use Topics  . Alcohol use: No  . Drug use: No     Review of Systems  Constitutional:   No fever or chills.  Positive generalized weakness. ENT:   No sore throat. No rhinorrhea. Cardiovascular:   No chest pain or syncope. Respiratory:   No dyspnea or cough. Gastrointestinal:   Negative for abdominal pain, positive black diarrhea Musculoskeletal:   Negative for focal pain or swelling All other systems reviewed and are negative except as documented above in ROS and HPI.  ____________________________________________   PHYSICAL EXAM:  VITAL SIGNS: ED Triage Vitals  Enc Vitals Group     BP 04/25/19 1302 95/61     Pulse Rate 04/25/19 1302 (!) 102     Resp 04/25/19 1302 16     Temp 04/25/19 1302 98.3 F (36.8 C)     Temp Source 04/25/19 1302 Oral     SpO2 04/25/19 1302 100 %     Weight --      Height --      Head Circumference --      Peak Flow --      Pain Score 04/25/19 1301 0     Pain Loc --      Pain Edu? --      Excl. in GC? --     Vital signs reviewed, nursing assessments reviewed.   Constitutional:   Alert and oriented. Non-toxic appearance. Eyes:   Conjunctivae are normal. EOMI. PERRL. ENT      Head:   Normocephalic and atraumatic.      Nose:   Wearing a mask.      Mouth/Throat:   Wearing a mask.      Neck:   No meningismus. Full ROM. Hematological/Lymphatic/Immunilogical:   No cervical lymphadenopathy. Cardiovascular:   Tachycardia heart rate 105. Symmetric bilateral radial and DP pulses.  No murmurs. Cap refill delayed to 4 seconds Respiratory:   Normal respiratory effort without tachypnea/retractions. Breath sounds are clear and equal bilaterally. No wheezes/rales/rhonchi. Gastrointestinal:   Hyperactive bowel sounds.  Soft and nontender. Non distended. There is no CVA tenderness.  No rebound, rigidity, or guarding.  Rectal exam reveals melanotic stool, strongly Hemoccult positive Musculoskeletal:   Normal range of motion in all extremities. No joint effusions.  No lower extremity tenderness.  No  edema. Neurologic:   Normal speech and language.  Motor grossly intact. No acute focal neurologic deficits are appreciated.  Skin:    Skin is warm, dry and intact. No rash noted.  No petechiae, purpura, or bullae.  ____________________________________________    LABS (pertinent positives/negatives) (all labs ordered are listed, but only abnormal results are displayed) Labs Reviewed  COMPREHENSIVE METABOLIC PANEL - Abnormal; Notable for the following components:      Result Value   CO2 20 (*)    Glucose, Bld 117 (*)    BUN 27 (*)    Calcium 8.1 (*)    Total Protein 6.0 (*)  Albumin 2.8 (*)    All other components within normal limits  CBC - Abnormal; Notable for the following components:   RBC 3.79 (*)    Hemoglobin 11.4 (*)    HCT 34.1 (*)    All other components within normal limits  SARS CORONAVIRUS 2 (TAT 6-24 HRS)  LIPASE, BLOOD  URINALYSIS, COMPLETE (UACMP) WITH MICROSCOPIC  PROTIME-INR  TYPE AND SCREEN   ____________________________________________   EKG    ____________________________________________    RADIOLOGY  No results found.  ____________________________________________   PROCEDURES Procedures  ____________________________________________    CLINICAL IMPRESSION / ASSESSMENT AND PLAN / ED COURSE  Medications ordered in the ED: Medications  sodium chloride 0.9 % bolus 1,000 mL (has no administration in time range)  pantoprazole (PROTONIX) injection 40 mg (has no administration in time range)    Pertinent labs & imaging results that were available during my care of the patient were reviewed by me and considered in my medical decision making (see chart for details).  Jeremy Merritt was evaluated in Emergency Department on 04/25/2019 for the symptoms described in the history of present illness. He was evaluated in the context of the global COVID-19 pandemic, which necessitated consideration that the patient might be at risk for  infection with the SARS-CoV-2 virus that causes COVID-19. Institutional protocols and algorithms that pertain to the evaluation of patients at risk for COVID-19 are in a state of rapid change based on information released by regulatory bodies including the CDC and federal and state organizations. These policies and algorithms were followed during the patient's care in the ED.   Patient presents with upper GI bleed, melanotic stool.  Borderline blood pressure though not hypotensive.  Mild tachycardia indicates a degree of shock.  With his age and being symptomatic, I will give IV fluids for volume replacement.  Hemoglobin is 11 so would not transfuse at this time but he will need hospitalization for further stabilization and GI evaluation.  IV Protonix ordered.  Covid screening.  Discussed with hospitalist.      ____________________________________________   FINAL CLINICAL IMPRESSION(S) / ED DIAGNOSES    Final diagnoses:  Acute upper GI bleed  Generalized weakness     ED Discharge Orders    None      Portions of this note were generated with dragon dictation software. Dictation errors may occur despite best attempts at proofreading.   Sharman Cheek, MD 04/25/19 [phone]

## 2019-04-25 NOTE — Telephone Encounter (Signed)
Spoke with the patient and gave him the recommendation from Eulogio Ditch NP that he should be evaluated at the ED. Patient stated he didn't know if he had the energy to go to the ED. I did urge him to go and be accessed for his symptoms.

## 2019-04-25 NOTE — Telephone Encounter (Signed)
Patient called stating that he is still pooping his pants every ten minutes and he had a procedure on 04/18/2019 with Dr. Lucky Cowboy. Patient states he has no energy.

## 2019-04-25 NOTE — Telephone Encounter (Signed)
There is concern that he may have a GI bleed since the last note I looked at stated he had some blood in his stool and his hemoglobin had dropped.  Because we can't assess that in office, it would be prudent if he goes to the Emergency Room for workup.  There is concern that his decreased energy could be due possible bleeding.

## 2019-04-25 NOTE — ED Notes (Signed)
Incision site appears to be clean and dry , no drainage or redness noted

## 2019-04-25 NOTE — ED Triage Notes (Signed)
Pt had VISCERAL ANGIOGRAPHY last week and was told to come back to ED for admission due to pt having diarrhea and weakness and decreased PO intake. Denies fever. Q&VZ5

## 2019-04-25 NOTE — Progress Notes (Signed)
The patient is an 84 year old gentleman who presents with melena consistent with GI bleed.  On November 11 of this year just 1 week ago the patient underwent angiography secondary to mesenteric ischemia.  At that time a string sign was identified in his SMA and a lifestream stent was placed.  Because of the intervention he was placed on both Plavix and aspirin.  Given his new onset GI bleed cessation of antiplatelet therapy is needed.  The stent that was utilized is a Gore-Tex covered stent which represents substantially lower risk for thrombosis if antiplatelet therapy is stopped relative to a bare-metal stent.  It is lower risk to stop his antiplatelet therapy at this point given his bleeding.  If his work-up identifies a correctable cause for his GI bleed then restarting his antiplatelet therapy would be appropriate.  Full consult to follow

## 2019-04-25 NOTE — Consult Note (Signed)
GI Inpatient Consult Note  Reason for Consult: Melena    Attending Requesting Consult: Dr. Esaw Grandchild, DO  History of Present Illness: Jeremy Merritt is a 84 y.o. male seen for evaluation of melena at the request of Dr. Denton Lank, DO. Patient has a PMH of chronic mesenteric ischemia s/p angioplasty last week by Dr. Wyn Quaker, hypothyroidism, GERD, HLD, severe protein-calorie malnutrition. He was seen by me in the office as an initial consult 11/09 for change in bowel habits, rectal pain, and unintentional weight loss of 30-lbs over the past 11 months. He reports over the past year there has been a change in his bowel habits where stools are not formed and are a loose consistency, #7 on Bristol scale. He reports stools look like black water. He reports worsening fatigue and weakness over the past week and persistent black stools. He actually reports to me today he has noticed black stools intermittently over the past year, but "they just won't go away this time." He denies any bright red bloody stools. He reports his appetite has been better since his vascular surgery, but is concerned because the stools are still black. He denies any frequent NSAID use. He has been put on aspirin and Plavix since his procedures. Upon presentation to the ED, BP 95/61, HR 102, hemoglobin 11.4 (down from 12.2 last week). He denies any fever, night sweats, cough, nausea, vomiting, heartburn symptoms, dysphagia, odynophagia, chest pain. He has never had an EGD or colonoscopy. He denies any known family hx of colon cancer, polyps, or IBD.    Last Colonoscopy: Never performed Last Endoscopy: Never performed   Past Medical History:  Past Medical History:  Diagnosis Date  . Allergy   . Hyperlipidemia   . Hypertension   . Thyroid disease     Problem List: Patient Active Problem List   Diagnosis Date Noted  . GI bleed 04/25/2019  . Protein-calorie malnutrition, severe 04/18/2019  . Essential hypertension  04/17/2019  . Hyperlipidemia 04/17/2019  . Loss of weight 04/17/2019  . Mesenteric ischemia (HCC) 04/17/2019  . Chronic mesenteric ischemia (HCC) 10/17/2018  . History of diverticulitis 10/17/2018  . Chronic abdominal pain 10/17/2018  . Abdominal pain, LLQ 09/21/2018  . Diverticulitis large intestine w/o perforation or abscess w/bleeding 09/21/2018  . Weight loss, non-intentional 09/21/2018  . Upper abdominal pain 06/15/2018  . Early satiety 06/15/2018  . Abdominal fullness 06/15/2018  . Constipation 06/15/2018    Past Surgical History: Past Surgical History:  Procedure Laterality Date  . INGUINAL HERNIA REPAIR    . VISCERAL ANGIOGRAPHY N/A 04/18/2019   Procedure: VISCERAL ANGIOGRAPHY;  Surgeon: Annice Needy, MD;  Location: [facility] INVASIVE CV LAB;  Service: Cardiovascular;  Laterality: N/A;    Allergies: No Known Allergies  Home Medications: (Not in a hospital admission)  Home medication reconciliation was completed with the patient.   Scheduled Inpatient Medications:   . [START ON 04/26/2019] pantoprazole (PROTONIX) IV  40 mg Intravenous Q12H  . sodium chloride flush  3 mL Intravenous Q12H    Continuous Inpatient Infusions:   . lactated ringers      PRN Inpatient Medications:  acetaminophen **OR** acetaminophen, bisacodyl, magnesium citrate, ondansetron **OR** ondansetron (ZOFRAN) IV, polyethylene glycol, traZODone  Family History: family history includes Kidney failure in his brother; Other in his father and mother; Thyroid cancer in his sister.  The patient's family history is negative for inflammatory bowel disorders, GI malignancy, or solid organ transplantation.  Social History:   reports that he has never smoked.  He has never used smokeless tobacco. He reports that he does not drink alcohol or use drugs. The patient denies ETOH, tobacco, or drug use.   Review of Systems: Constitutional: Weight is stable.  Eyes: No changes in vision. ENT: No oral lesions, sore  throat.  GI: see HPI.  Heme/Lymph: No easy bruising.  CV: No chest pain.  GU: No hematuria.  Integumentary: No rashes.  Neuro: No headaches.  Psych: No depression/anxiety.  Endocrine: No heat/cold intolerance.  Allergic/Immunologic: No urticaria.  Resp: No cough, SOB.  Musculoskeletal: No joint swelling.    Physical Examination: BP 91/74   Pulse 84   Temp 98.3 F (36.8 C) (Oral)   Resp 18   SpO2 100%   Cachetic, elderly male in ED stretcher. Answers all questions.  Gen: NAD, alert and oriented x 4 HEENT: PEERLA, EOMI, Neck: supple, no JVD or thyromegaly Chest: CTA bilaterally, no wheezes, crackles, or other adventitious sounds CV: RRR, no m/g/c/r Abd: soft, NT, ND, +BS in all four quadrants; no HSM, guarding, ridigity, or rebound tenderness Ext: no edema, well perfused with 2+ pulses, Skin: no rash or lesions noted Lymph: no LAD  Data: Lab Results  Component Value Date   WBC 9.3 04/25/2019   HGB 11.4 (L) 04/25/2019   HCT 34.1 (L) 04/25/2019   MCV 90.0 04/25/2019   PLT 383 04/25/2019   Recent Labs  Lab 04/19/19 0337 04/19/19 1111 04/25/19 1303  HGB 10.6* 12.2* 11.4*   Lab Results  Component Value Date   NA 138 04/25/2019   K 3.7 04/25/2019   CL 106 04/25/2019   CO2 20 (L) 04/25/2019   BUN 27 (H) 04/25/2019   CREATININE 0.94 04/25/2019   Lab Results  Component Value Date   ALT 29 04/25/2019   AST 37 04/25/2019   ALKPHOS 87 04/25/2019   BILITOT 0.5 04/25/2019   Recent Labs  Lab 04/25/19 1617  INR 1.1   Assessment/Plan:  84 y/o Caucasian male with a PMH chronic mesenteric ischemia s/p angioplasty last week by Dr. Lucky Cowboy, hypothyroidism, GERD, HLD, severe protein-calorie malnutrition presents to the ED for progressive melena  1. Melena 2. Acute blood loss anemia 3. Progressive weakness and fatigue - likely 2/2 #1 and #2 4. Chronic mesenteric ischemia s/p stenting 04/18/2019 5. Blood thinners - ASA and Plavis since stenting 6. Cachetic. Severe  protein-calorie malnutrition 7. Unintentional weight loss  -Melena symptoms concerning for possible UGI bleed. Differential includes peptic ulcer disease, gastritis +/- H pylori, duodenitis, AVMs, esophagitis, malignancy, right colon source, polyp, diverticular, etc. -Concern for malignancy is high given his 30-lb unintentional weight loss, decreased PO intake, and change in bowel habits -Continue to monitor serial H&H. Transfuse for Hgb <7.0. -Agree with IV acid suppression -IV fluid hydration -Hold ASA and Plavix -No signs of active GI bleeding at this time. Anticipate EGD and colonoscopy this hospital admission. Will need to wait at least 48 hours after holding antiplatelet therapy in setting of no active bleeding. -Consult to vascular surgery and defer to their recommendations about antiplatelet therapy -Following along   Thank you for the consult. Please call with questions or concerns.  Reeves Forth [facility] [phone] (Cell)

## 2019-04-25 NOTE — H&P (Signed)
History and Physical    MAKENZIE VITTORIO TGG:[id number] DOB: 1934-11-24 DOA: 04/25/2019  PCP: [facility]   Patient coming from: Home, son lives with patient  I have personally briefly reviewed patient's old medical records in [facility]  Chief Complaint: Progressive weakness and fatigue  HPI: BRYDEN DARDEN is a 84 y.o. male with medical history significant of chronic mesenteric ischemia, hypothyroidism, GERD, hyperlipidemia severe protein calorie malnutrition.  He presented to the ED today due to worsening fatigue and generalized weakness in the setting of 1 week or more of black tarry stools.  Patient reports 30 pound weight loss over the past several months in addition to very poor appetite.  Regarding bloody stool, patient reports this has been going on for quite a long time, possibly up to 1 year ago, has been intermittent.  States he has never had a colonoscopy or upper endoscopy.  Denies use of NSAIDs or alcohol.  Of note regarding his chronic mesenteric ischemia, patient recently had stent placed in the superior mesenteric artery on 04/18/2019 with vascular surgery.  Has been on aspirin and Plavix since that time.  Patient denies abdominal pain, nausea, vomiting, constipation.  ED Course: Borderline blood pressure 95/61, slightly tachycardic with HR 102.  Labs notable for hemoglobin 11.4, from 12.2 on 11/12.  Review of Systems: As per HPI otherwise 10 point review of systems negative.   Past Medical History:  Diagnosis Date  . Allergy   . Hyperlipidemia   . Hypertension   . Thyroid disease     Past Surgical History:  Procedure Laterality Date  . INGUINAL HERNIA REPAIR    . VISCERAL ANGIOGRAPHY N/A 04/18/2019   Procedure: VISCERAL ANGIOGRAPHY;  Surgeon: Annice Needy, MD;  Location: [facility] INVASIVE CV LAB;  Service: Cardiovascular;  Laterality: N/A;     reports that he has never smoked. He has never used smokeless tobacco. He reports that he does  not drink alcohol or use drugs.  No Known Allergies  Family History  Problem Relation Age of Onset  . Other Mother        died of old age  . Other Father        Black lung  . Thyroid cancer Sister   . Kidney failure Brother        on dialysis  . Colon cancer Neg Hx   . Liver cancer Neg Hx   . Stomach cancer Neg Hx     Prior to Admission medications   Medication Sig Start Date End Date Taking? Authorizing Provider  aspirin EC 81 MG tablet Take 81 mg by mouth daily.   Yes [provider]  calcium carbonate (TUMS) 500 MG chewable tablet Chew 500-1,000 mg by mouth daily as needed (acid reduction).    Yes [provider]  cetirizine (ZYRTEC) 10 MG tablet Take 0.5-1 tablets (5-10 mg total) by mouth at bedtime. May stop taking daily after 6 weeks. 10/14/16  Yes Benjiman Core D, PA-C  Cholecalciferol (VITAMIN D3) 2000 units TABS Take 2,000 Units by mouth daily.    Yes [provider]  clopidogrel (PLAVIX) 75 MG tablet Take 1 tablet (75 mg total) by mouth daily. 04/20/19  Yes Stegmayer, Cala Bradford A, PA-C  feeding supplement, ENSURE ENLIVE, (ENSURE ENLIVE) LIQD Take 237 mLs by mouth 2 (two) times daily between meals. 04/19/19  Yes Stegmayer, Ranae Plumber, PA-C  levothyroxine (SYNTHROID, LEVOTHROID) 100 MCG tablet Take 100 mcg by mouth daily before breakfast.   Yes [provider]  lisinopril-hydrochlorothiazide (PRINZIDE,ZESTORETIC) 20-12.5 MG tablet Take 1 tablet by mouth daily.   Yes [provider]  megestrol (MEGACE) 40 MG tablet Take 40 mg by mouth daily. 03/27/19  Yes [provider]  Multiple Vitamin (MULTIVITAMIN WITH MINERALS) TABS tablet Take 1 tablet by mouth daily. 04/20/19  Yes Stegmayer, Ranae PlumberKimberly A, PA-C  omeprazole (PRILOSEC) 40 MG capsule Take 1 capsule (40 mg total) by mouth daily. 06/15/18  Yes Sagardia, Eilleen KempfMiguel Jose, MD  simvastatin (ZOCOR) 20 MG tablet Take 20 mg by mouth at bedtime.    Yes [provider]     Physical Exam: Vitals:   04/25/19 1302  BP: 95/61  Pulse: (!) 102  Resp: 16  Temp: 98.3 F (36.8 C)  TempSrc: Oral  SpO2: 100%     Vitals:   04/25/19 1302  BP: 95/61  Pulse: (!) 102  Resp: 16  Temp: 98.3 F (36.8 C)  TempSrc: Oral  SpO2: 100%   Constitutional: NAD, calm, comfortable, underweight Eyes: EOMI, lids and conjunctivae normal ENMT: Dry mucous membranes. Oropharynx clear of any exudate or lesions.  Respiratory: clear to auscultation bilaterally, no wheezing, no crackles. Normal respiratory effort. No accessory muscle use.  Cardiovascular: Regular rate and rhythm, no murmurs / rubs / gallops. No extremity edema. 2+ pedal pulses.   Abdomen: no tenderness, no masses palpated. No hepatosplenomegaly. Bowel sounds positive.  Musculoskeletal: no clubbing / cyanosis. No joint deformity upper and lower extremities. Good ROM, no contractures. Normal muscle tone.  Neurologic: CN 2-12 grossly intact. Normal speech. Psychiatric: Normal judgment and insight. Alert and oriented x 3. Normal mood.   Labs on Admission: I have personally reviewed following labs and imaging studies  CBC: Recent Labs  Lab 04/18/19 1811 04/19/19 0337 04/19/19 1111 04/25/19 1303  WBC 9.4 8.7 10.2 9.3  HGB 13.0 10.6* 12.2* 11.4*  HCT 38.4* 31.7* 35.2* 34.1*  MCV 86.9 88.1 85.9 90.0  PLT 318 291 303 383   Basic Metabolic Panel: Recent Labs  Lab 04/19/19 0337 04/25/19 1303  NA 139 138  K 4.3 3.7  CL 109 106  CO2 25 20*  GLUCOSE 100* 117*  BUN 22 27*  CREATININE 0.97 0.94  CALCIUM 7.8* 8.1*  MG 2.1  --   PHOS 2.2*  --    GFR: Estimated Creatinine Clearance: 34 mL/min (by C-G formula based on SCr of 0.94 mg/dL). Liver Function Tests: Recent Labs  Lab 04/25/19 1303  AST 37  ALT 29  ALKPHOS 87  BILITOT 0.5  PROT 6.0*  ALBUMIN 2.8*   Recent Labs  Lab 04/25/19 1303  LIPASE 21   No results for input(s): AMMONIA in the last 168 hours. Coagulation Profile: No results for  input(s): INR, PROTIME in the last 168 hours. Cardiac Enzymes: No results for input(s): CKTOTAL, CKMB, CKMBINDEX, TROPONINI in the last 168 hours. BNP (last 3 results) No results for input(s): PROBNP in the last 8760 hours. HbA1C: No results for input(s): HGBA1C in the last 72 hours. CBG: No results for input(s): GLUCAP in the last 168 hours. Lipid Profile: No results for input(s): CHOL, HDL, LDLCALC, TRIG, CHOLHDL, LDLDIRECT in the last 72 hours. Thyroid Function Tests: No results for input(s): TSH, T4TOTAL, FREET4, T3FREE, THYROIDAB in the last 72 hours. Anemia Panel: No results for input(s): VITAMINB12, FOLATE, FERRITIN, TIBC, IRON, RETICCTPCT in the last 72 hours. Urine analysis:    Component Value Date/Time   BILIRUBINUR negative 06/15/2018 1224   KETONESUR negative 06/15/2018 1224   PROTEINUR negative 06/15/2018 1224  UROBILINOGEN 0.2 06/15/2018 1224   NITRITE Negative 06/15/2018 1224   LEUKOCYTESUR Negative 06/15/2018 1224    Radiological Exams on Admission: No results found.  EKG: none  Assessment/Plan Principal Problem:   GI bleed Active Problems:   Chronic mesenteric ischemia (HCC)   Essential hypertension   Hyperlipidemia   Protein-calorie malnutrition, severe  GI bleed Hemodynamically stable on admission.  Hemoglobin 11.4.  Has never had colonoscopy or EGD.  Concern for GI malignancy given significant recent weight loss and poor appetite.  Differential also includes diverticular bleeding, hemorrhoidal bleeding less likely with melanotic stool, versus peptic ulcer disease. -GI consulted -IV PPI -Trend H&H -IV hydration -Hold aspirin and Plavix -SCDs for VT prophylaxis -Clear liquids for now  Chronic mesenteric ischemia Status post stent to SMA on 11/11.  Has been on aspirin and Plavix since -hold both due to bleeding.  Essential hypertension Currently borderline low blood pressures in setting of GI bleeding.  Does not appear to be on antihypertensives  at home however.  Hyperlipidemia -continue home simvastatin  Severe protein calorie malnutrition  -Continue ensures -Continue Megace  Seasonal allergies -continue Zyrtec  GERD -on Prilosec at home, hold while on IV PPI  Hypothyroidism -continue levothyroxine   DVT prophylaxis: SCDs, TED hose Code Status: Full  Family Communication: None at bedside.   Disposition Plan: Pending clinical course, expect discharge home  Consults called: GI Dr. Lorie Apley  Admission status: Inpatient   Patient with ongoing GI bleeding and associated anemia will require more than 2 midnight stay for further evaluation to include GI consult for possible endoscopies.   Ezekiel Slocumb, DO [facility] Pager [phone]  If 7PM-7AM, please contact night-coverage [email or web address] Password [facility]  04/25/2019, 4:49 PM

## 2019-04-25 NOTE — ED Notes (Signed)
Lactic acid sent in case of future add on

## 2019-04-26 ENCOUNTER — Other Ambulatory Visit: Payer: Self-pay

## 2019-04-26 LAB — COMPREHENSIVE METABOLIC PANEL
ALT: 25 U/L (ref 0–44)
AST: 26 U/L (ref 15–41)
Albumin: 2.6 g/dL — ABNORMAL LOW (ref 3.5–5.0)
Alkaline Phosphatase: 80 U/L (ref 38–126)
Anion gap: 10 (ref 5–15)
BUN: 23 mg/dL (ref 8–23)
CO2: 22 mmol/L (ref 22–32)
Calcium: 8 mg/dL — ABNORMAL LOW (ref 8.9–10.3)
Chloride: 106 mmol/L (ref 98–111)
Creatinine, Ser: 0.78 mg/dL (ref 0.61–1.24)
GFR calc Af Amer: >60 mL/min (ref >60)
GFR calc non Af Amer: >60 mL/min (ref >60)
Glucose, Bld: 88 mg/dL (ref 70–99)
Potassium: 3.7 mmol/L (ref 3.5–5.1)
Sodium: 138 mmol/L (ref 135–145)
Total Bilirubin: 0.7 mg/dL (ref 0.3–1.2)
Total Protein: 5.8 g/dL — ABNORMAL LOW (ref 6.5–8.1)

## 2019-04-26 LAB — CBC
HCT: 31.8 % — ABNORMAL LOW (ref 39.0–52.0)
Hemoglobin: 11 g/dL — ABNORMAL LOW (ref 13.0–17.0)
MCH: 29.3 pg (ref 26.0–34.0)
MCHC: 34.6 g/dL (ref 30.0–36.0)
MCV: 84.6 fL (ref 80.0–100.0)
Platelets: 327 10*3/uL (ref 150–400)
RBC: 3.76 MIL/uL — ABNORMAL LOW (ref 4.22–5.81)
RDW: 14.1 % (ref 11.5–15.5)
WBC: 6.5 10*3/uL (ref 4.0–10.5)
nRBC: 0 % (ref 0.0–0.2)

## 2019-04-26 LAB — SARS CORONAVIRUS 2 (TAT 6-24 HRS): SARS Coronavirus 2: NEGATIVE

## 2019-04-26 LAB — MAGNESIUM: Magnesium: 2 mg/dL (ref 1.7–2.4)

## 2019-04-26 MED ORDER — DRONABINOL 2.5 MG PO CAPS
2.5000 mg | ORAL_CAPSULE | Freq: Two times a day (BID) | ORAL | Status: DC
  Administered 2019-04-26 – 2019-04-29 (×6): 2.5 mg via ORAL
  Filled 2019-04-26 (×6): qty 1

## 2019-04-26 MED ORDER — ENSURE ENLIVE PO LIQD: 237.0000 mL | Freq: Two times a day (BID) | ORAL | Status: DC

## 2019-04-26 MED ORDER — BOOST / RESOURCE BREEZE PO LIQD CUSTOM
1.0000 | Freq: Three times a day (TID) | ORAL | Status: DC
  Administered 2019-04-28 – 2019-04-29 (×3): 1 via ORAL

## 2019-04-26 MED ORDER — CALCIUM CARBONATE ANTACID 500 MG PO CHEW: 500.0000 mg | CHEWABLE_TABLET | Freq: Every day | ORAL | Status: DC | PRN

## 2019-04-26 MED ORDER — VITAMIN D 25 MCG (1000 UNIT) PO TABS
2000.0000 [IU] | ORAL_TABLET | Freq: Every day | ORAL | Status: DC
  Administered 2019-04-26 – 2019-04-29 (×3): 2000 [IU] via ORAL
  Filled 2019-04-26 (×4): qty 2

## 2019-04-26 MED ORDER — LEVOTHYROXINE SODIUM 100 MCG PO TABS
100.0000 ug | ORAL_TABLET | Freq: Every day | ORAL | Status: DC
  Administered 2019-04-26 – 2019-04-29 (×3): 100 ug via ORAL
  Filled 2019-04-26: qty 2
  Filled 2019-04-26 (×3): qty 1

## 2019-04-26 MED ORDER — ADULT MULTIVITAMIN W/MINERALS CH
1.0000 | ORAL_TABLET | Freq: Every day | ORAL | Status: DC
  Administered 2019-04-26 – 2019-04-29 (×3): 1 via ORAL
  Filled 2019-04-26 (×4): qty 1

## 2019-04-26 MED ORDER — LACTATED RINGERS IV SOLN
INTRAVENOUS | Status: DC
  Administered 2019-04-26 – 2019-04-28 (×4): via INTRAVENOUS

## 2019-04-26 MED ORDER — MEGESTROL ACETATE 20 MG PO TABS: 40.0000 mg | ORAL_TABLET | Freq: Every day | ORAL | Status: DC

## 2019-04-26 MED ORDER — SIMVASTATIN 20 MG PO TABS
20.0000 mg | ORAL_TABLET | Freq: Every day | ORAL | Status: DC
  Administered 2019-04-26 – 2019-04-28 (×3): 20 mg via ORAL
  Filled 2019-04-26 (×3): qty 1
  Filled 2019-04-26: qty 2

## 2019-04-26 MED ORDER — POLYETHYLENE GLYCOL 3350 17 GM/SCOOP PO POWD
1.0000 | Freq: Once | ORAL | Status: AC
  Administered 2019-04-26: 255 g via ORAL
  Filled 2019-04-26 (×2): qty 255

## 2019-04-26 MED ORDER — LORATADINE 10 MG PO TABS
10.0000 mg | ORAL_TABLET | Freq: Every day | ORAL | Status: DC
  Administered 2019-04-26 – 2019-04-29 (×3): 10 mg via ORAL
  Filled 2019-04-26 (×4): qty 1

## 2019-04-26 NOTE — Progress Notes (Addendum)
PROGRESS NOTE    Jeremy Merritt  EHM:[id number] DOB: 10-09-1934 DOA: 04/25/2019  PCP: [facility]    LOS - 1   Brief Narrative:  84 y.o. male with medical history significant of chronic mesenteric ischemia, hypothyroidism, GERD, hyperlipidemia severe protein calorie malnutrition.  He presented to the ED 04/25/19 due to worsening fatigue and generalized weakness in the setting of 1 week or more of black tarry stools without any associated abdominal pain, nausea or vomiting.  Has had prior episodes over past several months, also with 30+ pound weight loss and very poor appetite.  Of note regarding his chronic mesenteric ischemia, patient recently had stent placed in the superior mesenteric artery on 04/18/2019 with vascular surgery.  Has been on aspirin and Plavix since that time.  In the ED, borderline blood pressure 95/61, slightly tachycardic with HR 102.  Labs notable for hemoglobin 11.4, from 12.2 on 11/12.  Subjective 11/19: Patient seen and examined, awake in bed.  No acute events reported overnight.  He reports still feeling weak, but slightly better today.  Patient denies fevers or chills, chest pain, shortness of breath, abdominal pain nausea or vomiting.  Assessment & Plan:   Principal Problem:   GI bleed Active Problems:   Chronic mesenteric ischemia (HCC)   Essential hypertension   Hyperlipidemia   Protein-calorie malnutrition, severe   GI bleed Hemodynamically stable on admission.  Hemoglobin 11.4.  Etiology to be determined.  Melanotic stools point to upper GI source.  Has never had colonoscopy or EGD.  Concern for GI malignancy given significant recent weight loss and poor appetite.  Differential also includes diverticular bleeding, hemorrhoidal bleeding less likely with melanotic stool, versus peptic ulcer disease / gastritis, esophagitis. -GI consulted -Awaiting Plavix washout endoscopies -Continue IV PPI -Trend H&H -Continue IV hydration -Hold  aspirin and Plavix -SCDs for VT prophylaxis -Clear liquids for now, n.p.o. after midnight tonight  Chronic mesenteric ischemia Status post stent to SMA on 11/11.  Has been on aspirin and Plavix since -hold both due to bleeding.  Essential hypertension Currently borderline low blood pressures in setting of GI bleeding.  Does not appear to be on antihypertensives at home however.  Hyperlipidemia -continue home simvastatin  Severe protein calorie malnutrition  -Continue Ensures -Continue Megace  Seasonal allergies -continue Zyrtec  GERD -on Prilosec at home, hold while on IV PPI  Hypothyroidism -continue levothyroxine   DVT prophylaxis: SCDs, TED hose   Code Status: Full Code  Family Communication: None at bedside Disposition Plan: Pending clinical course, endoscopies, and clearance by GI   Consultants:   Gastroenterology  Procedures:  None  Antimicrobials:   None   Objective: Vitals:   04/25/19 2121 04/25/19 2200 04/26/19 0336 04/26/19 0519  BP: (!) 109/50 (!) 114/54 (!) 129/54   Pulse: 77 75 64   Resp: 16 16 13    Temp: 98.8 F (37.1 C) 98.8 F (37.1 C)  98.6 F (37 C)  TempSrc: Oral Oral  Oral  SpO2: 100% 100% 100%     Intake/Output Summary (Last 24 hours) at 04/26/2019 04/28/2019 Last data filed at 04/25/2019 2300 Gross per 24 hour  Intake 1100 ml  Output -  Net 1100 ml   There were no vitals filed for this visit.  Examination:  General exam: awake, alert, no acute distress Respiratory system: clear to auscultation bilaterally, no wheezes, rales or rhonchi, normal respiratory effort. Cardiovascular system: normal S1/S2, RRR, no JVD, murmurs, rubs, gallops, no pedal edema.   Gastrointestinal system: soft, non-tender,  non-distended abdomen, no organomegaly or masses felt, normal bowel sounds. Central nervous system: alert and oriented x4. no gross focal neurologic deficits, normal speech Extremities: moves all, no edema, normal tone Skin: dry,  intact, normal temperature Psychiatry: normal mood, congruent affect, judgement and insight appear normal    Data Reviewed: I have personally reviewed following labs and imaging studies  CBC: Recent Labs  Lab 04/19/19 1111 04/25/19 1303 04/26/19 0630  WBC 10.2 9.3 6.5  HGB 12.2* 11.4* 11.0*  HCT 35.2* 34.1* 31.8*  MCV 85.9 90.0 84.6  PLT 303 383 327   Basic Metabolic Panel: Recent Labs  Lab 04/25/19 1303 04/26/19 0630  NA 138 138  K 3.7 3.7  CL 106 106  CO2 20* 22  GLUCOSE 117* 88  BUN 27* 23  CREATININE 0.94 0.78  CALCIUM 8.1* 8.0*  MG  --  2.0   GFR: Estimated Creatinine Clearance: 40 mL/min (by C-G formula based on SCr of 0.78 mg/dL). Liver Function Tests: Recent Labs  Lab 04/25/19 1303 04/26/19 0630  AST 37 26  ALT 29 25  ALKPHOS 87 80  BILITOT 0.5 0.7  PROT 6.0* 5.8*  ALBUMIN 2.8* 2.6*   Recent Labs  Lab 04/25/19 1303  LIPASE 21   No results for input(s): AMMONIA in the last 168 hours. Coagulation Profile: Recent Labs  Lab 04/25/19 1617  INR 1.1   Cardiac Enzymes: No results for input(s): CKTOTAL, CKMB, CKMBINDEX, TROPONINI in the last 168 hours. BNP (last 3 results) No results for input(s): PROBNP in the last 8760 hours. HbA1C: No results for input(s): HGBA1C in the last 72 hours. CBG: No results for input(s): GLUCAP in the last 168 hours. Lipid Profile: No results for input(s): CHOL, HDL, LDLCALC, TRIG, CHOLHDL, LDLDIRECT in the last 72 hours. Thyroid Function Tests: No results for input(s): TSH, T4TOTAL, FREET4, T3FREE, THYROIDAB in the last 72 hours. Anemia Panel: No results for input(s): VITAMINB12, FOLATE, FERRITIN, TIBC, IRON, RETICCTPCT in the last 72 hours. Sepsis Labs: No results for input(s): PROCALCITON, LATICACIDVEN in the last 168 hours.  Recent Results (from the past 240 hour(s))  SARS CORONAVIRUS 2 (TAT 6-24 HRS) Nasopharyngeal Nasopharyngeal Swab     Status: None   Collection Time: 04/17/19  4:08 PM   Specimen:  Nasopharyngeal Swab  Result Value Ref Range Status   SARS Coronavirus 2 NEGATIVE NEGATIVE Final    Comment: (NOTE) SARS-CoV-2 target nucleic acids are NOT DETECTED. The SARS-CoV-2 RNA is generally detectable in upper and lower respiratory specimens during the acute phase of infection. Negative results do not preclude SARS-CoV-2 infection, do not rule out co-infections with other pathogens, and should not be used as the sole basis for treatment or other patient management decisions. Negative results must be combined with clinical observations, patient history, and epidemiological information. The expected result is Negative. Fact Sheet for Patients: HairSlick.nohttps://www.fda.gov/media/138098/download Fact Sheet for Healthcare Providers: quierodirigir.comhttps://www.fda.gov/media/138095/download This test is not yet approved or cleared by the [place] FDA and  has been authorized for detection and/or diagnosis of SARS-CoV-2 by FDA under an Emergency Use Authorization (EUA). This EUA will remain  in effect (meaning this test can be used) for the duration of the COVID-19 declaration under Section 56 4(b)(1) of the Act, 21 U.S.C. section 360bbb-3(b)(1), unless the authorization is terminated or revoked sooner. Performed at [facility], [address] [place], [place] [address]   SARS CORONAVIRUS 2 (TAT 6-24 HRS) Nasopharyngeal Nasopharyngeal Swab     Status: None   Collection Time: 04/25/19  5:07 PM   Specimen: Nasopharyngeal Swab  Result Value Ref Range Status   SARS Coronavirus 2 NEGATIVE NEGATIVE Final    Comment: (NOTE) SARS-CoV-2 target nucleic acids are NOT DETECTED. The SARS-CoV-2 RNA is generally detectable in upper and lower respiratory specimens during the acute phase of infection. Negative results do not preclude SARS-CoV-2 infection, do not rule out co-infections with other pathogens, and should not be used as the sole basis for treatment or other patient management decisions.  Negative results must be combined with clinical observations, patient history, and epidemiological information. The expected result is Negative. Fact Sheet for Patients: SugarRoll.be Fact Sheet for Healthcare Providers: https://www.woods-mathews.com/ This test is not yet approved or cleared by the [place] FDA and  has been authorized for detection and/or diagnosis of SARS-CoV-2 by FDA under an Emergency Use Authorization (EUA). This EUA will remain  in effect (meaning this test can be used) for the duration of the COVID-19 declaration under Section 56 4(b)(1) of the Act, 21 U.S.C. section 360bbb-3(b)(1), unless the authorization is terminated or revoked sooner. Performed at [facility], [place] [address], [place], [place] [address]          Radiology Studies: No results found.      Scheduled Meds: . cholecalciferol  2,000 Units Oral Daily  . feeding supplement (ENSURE ENLIVE)  237 mL Oral BID BM  . levothyroxine  100 mcg Oral QAC breakfast  . loratadine  10 mg Oral Daily  . megestrol  40 mg Oral Daily  . multivitamin with minerals  1 tablet Oral Daily  . pantoprazole (PROTONIX) IV  40 mg Intravenous Q12H  . simvastatin  20 mg Oral QHS  . sodium chloride flush  3 mL Intravenous Q12H   Continuous Infusions: . lactated ringers       LOS: 1 day    Time spent: 30-35 minutes    Ezekiel Slocumb, DO [facility] Pager: [phone]  If 7PM-7AM, please contact night-coverage [email or web address] Password [email or web address] 04/26/2019, 8:26 AM

## 2019-04-26 NOTE — Progress Notes (Signed)
attempt to call report nurse unaware she was getting pt. Requested this Probation officer to give her some time. Notified charge nurse

## 2019-04-26 NOTE — Progress Notes (Signed)
GI Inpatient Follow-up Note  Subjective:  Patient seen in follow-up for melena and symptomatic anemia. No acute events overnight. He reports he had one dark bowel movement this morning. He has been tolerating clear liquid diet today without any difficulties. He denies any nausea, vomiting, abdominal pain. Vascular has seen the patient and agrees with holding antiplatelet therapy in setting of GI bleeding. Hemoglobin 11.0 this morning.   Scheduled Inpatient Medications:  . cholecalciferol  2,000 Units Oral Daily  . dronabinol  2.5 mg Oral BID AC  . feeding supplement  1 Container Oral TID BM  . levothyroxine  100 mcg Oral QAC breakfast  . loratadine  10 mg Oral Daily  . multivitamin with minerals  1 tablet Oral Daily  . pantoprazole (PROTONIX) IV  40 mg Intravenous Q12H  . polyethylene glycol powder  1 Container Oral Once  . simvastatin  20 mg Oral QHS  . sodium chloride flush  3 mL Intravenous Q12H    Continuous Inpatient Infusions:   . lactated ringers    . lactated ringers 100 mL/hr at 04/26/19 1532    PRN Inpatient Medications:  acetaminophen **OR** acetaminophen, bisacodyl, calcium carbonate, magnesium citrate, ondansetron **OR** ondansetron (ZOFRAN) IV, polyethylene glycol, traZODone  Review of Systems: Constitutional: Weight is stable.  Eyes: No changes in vision. ENT: No oral lesions, sore throat.  GI: see HPI.  Heme/Lymph: No easy bruising.  CV: No chest pain.  GU: No hematuria.  Integumentary: No rashes.  Neuro: No headaches.  Psych: No depression/anxiety.  Endocrine: No heat/cold intolerance.  Allergic/Immunologic: No urticaria.  Resp: No cough, SOB.  Musculoskeletal: No joint swelling.    Physical Examination: BP 121/79 (BP Location: Left Arm)   Pulse 87   Temp 97.9 F (36.6 C) (Oral)   Resp 13   Ht 5\' 7"  (1.702 m)   Wt 41.5 kg   SpO2 99%   BMI 14.32 kg/m  Gen: NAD, alert and oriented x 4 HEENT: PEERLA, EOMI, Neck: supple, no JVD or  thyromegaly Chest: CTA bilaterally, no wheezes, crackles, or other adventitious sounds CV: RRR, no m/g/c/r Abd: soft, NT, ND, +BS in all four quadrants; no HSM, guarding, ridigity, or rebound tenderness Ext: no edema, well perfused with 2+ pulses, Skin: no rash or lesions noted Lymph: no LAD  Data: Lab Results  Component Value Date   WBC 6.5 04/26/2019   HGB 11.0 (L) 04/26/2019   HCT 31.8 (L) 04/26/2019   MCV 84.6 04/26/2019   PLT 327 04/26/2019   Recent Labs  Lab 04/25/19 1303 04/26/19 0630  HGB 11.4* 11.0*   Lab Results  Component Value Date   NA 138 04/26/2019   K 3.7 04/26/2019   CL 106 04/26/2019   CO2 22 04/26/2019   BUN 23 04/26/2019   CREATININE 0.78 04/26/2019   Lab Results  Component Value Date   ALT 25 04/26/2019   AST 26 04/26/2019   ALKPHOS 80 04/26/2019   BILITOT 0.7 04/26/2019   Recent Labs  Lab 04/25/19 1617  INR 1.1   Assessment/Plan:   84 y/o Caucasian male with a PMH chronic mesenteric ischemia s/p angioplasty last week by Dr. 97, hypothyroidism, GERD, HLD, severe protein-calorie malnutrition presents to the ED for progressive melena  1. Melena 2. Acute blood loss anemia 3. Progressive weakness and fatigue - likely 2/2 #1 and #2 4. Chronic mesenteric ischemia s/p stenting 04/18/2019 5. Blood thinners - ASA and Plavix since stenting 6. Severe protein-calorie malnutrition 7. Unintentional weight loss   - Hemodynamically  stable at hemoglobin 11.0.  - Continue to monitor serial H&H. Transfuse as necessary to ensure hemoglobin >7.0 - Continue clear liquid diet - Hold all antiplatelet/anticoagulation. Vascular following. - Plan for EGD and colonoscopy tomorrow afternoon with Dr. Alice Reichert. - NPO after 0500 tomorrow - He will complete miralax/gatorade bowel prep 1730 today - See procedure note for findings and further recs  I reviewed the risks (including bleeding, perforation, infection, anesthesia complications, cardiac/respiratory  complications), benefits and alternatives of EGD and colonoscopy. Patient consents to proceed.     Please call with questions or concerns.    Octavia Bruckner, PA-C [facility] [phone] (Cell)

## 2019-04-26 NOTE — Progress Notes (Addendum)
Initial Nutrition Assessment  DOCUMENTATION CODES:   Severe malnutrition in context of chronic illness  INTERVENTION:   Boost Breeze po TID, each supplement provides 250 kcal and 9 grams of protein  MVI daily   NUTRITION DIAGNOSIS:   Severe Malnutrition related to chronic illness(chronic poor appetite, possible malignancy) as evidenced by severe fat depletion, severe muscle depletion.  GOAL:   Patient will meet greater than or equal to 90% of their needs  MONITOR:   PO intake, Supplement acceptance, Labs, Weight trends, Diet advancement, Skin, I & O's  REASON FOR ASSESSMENT:   Consult Assessment of nutrition requirement/status  ASSESSMENT:   84 y.o. male with medical history significant of chronic mesenteric ischemia, hypothyroidism, GERD, hyperlipidemia severe protein calorie malnutrition admitted with GIB   Pt well known to nutrition department from recent previous admit. Pt reports postprandial abdominal pain and melena pta. Pt reports decreased appetite and oral intake for several months pta and endorses a 30lb weight loss over the past 3 months. Per chart, pt has lost 36lbs(30%) in < 8 months; this is severe weight loss. Pt does enjoy Boost supplements at home. Pt currently on clear liquid diet for planned colonoscopy/EGD tomorrow. RD will change patient to Ensure once diet advanced. Suspect pt likely at refeed risk.   Medications reviewed and include: D3, marinol, synthroid, MVI, protonix, LRS 139ml/hr  Labs reviewed: 3.7 wnl, Mg 2.0 wnl  NUTRITION - FOCUSED PHYSICAL EXAM:    Most Recent Value  Orbital Region  Severe depletion  Upper Arm Region  Severe depletion  Thoracic and Lumbar Region  Moderate depletion  Buccal Region  Moderate depletion  Temple Region  Moderate depletion  Clavicle Bone Region  Severe depletion  Clavicle and Acromion Bone Region  Severe depletion  Scapular Bone Region  Severe depletion  Dorsal Hand  Moderate depletion  Patellar Region   Severe depletion  Anterior Thigh Region  Severe depletion  Posterior Calf Region  Moderate depletion  Edema (RD Assessment)  None  Hair  Reviewed  Eyes  Reviewed  Mouth  Reviewed  Skin  Reviewed  Nails  Reviewed     Diet Order:   Diet Order            Diet clear liquid Room service appropriate? Yes; Fluid consistency: Thin  Diet effective now             EDUCATION NEEDS:   Education needs have been addressed  Skin:  Skin Assessment: Reviewed RN Assessment(ecchymosis, MASD)  Last BM:  11/19- Type 6  Height:   Ht Readings from Last 1 Encounters:  04/26/19 5\' 7"  (1.702 m)    Weight:   Wt Readings from Last 1 Encounters:  04/26/19 41.5 kg    Ideal Body Weight:  67.3 kg  BMI:  Body mass index is 14.32 kg/m.  Estimated Nutritional Needs:   Kcal:  1500-1700kcal/day  Protein:  75-85g/day  Fluid:  >1L/day  Koleen Distance MS, RD, LDN Pager #- [phone] Office#- [phone] After Hours Pager: [phone]

## 2019-04-27 ENCOUNTER — Encounter: Payer: Self-pay | Admitting: Registered Nurse

## 2019-04-27 DIAGNOSIS — L8990 Pressure ulcer of unspecified site, unspecified stage: Secondary | ICD-10-CM | POA: Insufficient documentation

## 2019-04-27 LAB — CBC
HCT: 30.5 % — ABNORMAL LOW (ref 39.0–52.0)
Hemoglobin: 10.4 g/dL — ABNORMAL LOW (ref 13.0–17.0)
MCH: 29.9 pg (ref 26.0–34.0)
MCHC: 34.1 g/dL (ref 30.0–36.0)
MCV: 87.6 fL (ref 80.0–100.0)
Platelets: 332 10*3/uL (ref 150–400)
RBC: 3.48 MIL/uL — ABNORMAL LOW (ref 4.22–5.81)
RDW: 14 % (ref 11.5–15.5)
WBC: 7.2 10*3/uL (ref 4.0–10.5)
nRBC: 0 % (ref 0.0–0.2)

## 2019-04-27 LAB — RETICULOCYTES
Immature Retic Fract: 9.7 % (ref 2.3–15.9)
RBC.: 3.74 MIL/uL — ABNORMAL LOW (ref 4.22–5.81)
Retic Count, Absolute: 73.7 10*3/uL (ref 19.0–186.0)
Retic Ct Pct: 2 % (ref 0.4–3.1)

## 2019-04-27 LAB — IRON AND TIBC
Iron: 20 ug/dL — ABNORMAL LOW (ref 45–182)
Saturation Ratios: 11 % — ABNORMAL LOW (ref 17.9–39.5)
TIBC: 187 ug/dL — ABNORMAL LOW (ref 250–450)
UIBC: 167 ug/dL

## 2019-04-27 LAB — BASIC METABOLIC PANEL
Anion gap: 8 (ref 5–15)
BUN: 13 mg/dL (ref 8–23)
CO2: 25 mmol/L (ref 22–32)
Calcium: 7.8 mg/dL — ABNORMAL LOW (ref 8.9–10.3)
Chloride: 105 mmol/L (ref 98–111)
Creatinine, Ser: 0.73 mg/dL (ref 0.61–1.24)
GFR calc Af Amer: >60 mL/min (ref >60)
GFR calc non Af Amer: >60 mL/min (ref >60)
Glucose, Bld: 94 mg/dL (ref 70–99)
Potassium: 4.1 mmol/L (ref 3.5–5.1)
Sodium: 138 mmol/L (ref 135–145)

## 2019-04-27 LAB — FOLATE: Folate: 6.9 ng/mL (ref >5.9)

## 2019-04-27 LAB — MAGNESIUM: Magnesium: 1.7 mg/dL (ref 1.7–2.4)

## 2019-04-27 LAB — VITAMIN B12: Vitamin B-12: 335 pg/mL (ref 180–914)

## 2019-04-27 MED ORDER — SODIUM CHLORIDE 0.9 % IV SOLN: INTRAVENOUS | Status: DC

## 2019-04-27 MED ORDER — MAGNESIUM SULFATE 2 GM/50ML IV SOLN
2.0000 g | Freq: Once | INTRAVENOUS | Status: AC
  Administered 2019-04-27: 2 g via INTRAVENOUS
  Filled 2019-04-27: qty 50

## 2019-04-27 MED ORDER — PEG 3350-KCL-NA BICARB-NACL 420 G PO SOLR
4000.0000 mL | Freq: Once | ORAL | Status: AC
  Administered 2019-04-27: 4000 mL via ORAL
  Filled 2019-04-27: qty 4000

## 2019-04-27 NOTE — Progress Notes (Signed)
GI Inpatient Follow-up Note  Subjective:  Patient seen in follow-up for melena and symptomatic anemia. No acute events overnight. Procedures cancelled today due to inadequate prep and still having formed stools. Enemas x2 were performed with still formed stools. Hemoglobin stable at 10.4.  Scheduled Inpatient Medications:  . cholecalciferol  2,000 Units Oral Daily  . dronabinol  2.5 mg Oral BID AC  . feeding supplement  1 Container Oral TID BM  . levothyroxine  100 mcg Oral QAC breakfast  . loratadine  10 mg Oral Daily  . multivitamin with minerals  1 tablet Oral Daily  . pantoprazole (PROTONIX) IV  40 mg Intravenous Q12H  . simvastatin  20 mg Oral QHS  . sodium chloride flush  3 mL Intravenous Q12H    Continuous Inpatient Infusions:   . sodium chloride    . lactated ringers 100 mL/hr at 04/27/19 0448  . lactated ringers Stopped (04/26/19 1804)  . magnesium sulfate bolus IVPB 2 g (04/27/19 1253)    PRN Inpatient Medications:  acetaminophen **OR** acetaminophen, bisacodyl, calcium carbonate, magnesium citrate, ondansetron **OR** ondansetron (ZOFRAN) IV, polyethylene glycol, traZODone  Review of Systems: Constitutional: Weight is stable.  Eyes: No changes in vision. ENT: No oral lesions, sore throat.  GI: see HPI.  Heme/Lymph: No easy bruising.  CV: No chest pain.  GU: No hematuria.  Integumentary: No rashes.  Neuro: No headaches.  Psych: No depression/anxiety.  Endocrine: No heat/cold intolerance.  Allergic/Immunologic: No urticaria.  Resp: No cough, SOB.  Musculoskeletal: No joint swelling.    Physical Examination: BP (!) 126/50 (BP Location: Right Arm)   Pulse 79   Temp 98.1 F (36.7 C) (Oral)   Resp 18   Ht 5\' 7"  (1.702 m)   Wt 42.2 kg   SpO2 100%   BMI 14.58 kg/m  Cachetic, elderly male in hospital bed. Appears comfortable. Gen: NAD, alert and oriented x 4 HEENT: PEERLA, EOMI, Neck: supple, no JVD or thyromegaly Chest: CTA bilaterally, no wheezes,  crackles, or other adventitious sounds CV: RRR, no m/g/c/r Abd: soft, NT, ND, +BS in all four quadrants; no HSM, guarding, ridigity, or rebound tenderness Ext: no edema, well perfused with 2+ pulses, Skin: no rash or lesions noted Lymph: no LAD  Data: Lab Results  Component Value Date   WBC 7.2 04/27/2019   HGB 10.4 (L) 04/27/2019   HCT 30.5 (L) 04/27/2019   MCV 87.6 04/27/2019   PLT 332 04/27/2019   Recent Labs  Lab 04/25/19 1303 04/26/19 0630 04/27/19 0625  HGB 11.4* 11.0* 10.4*   Lab Results  Component Value Date   NA 138 04/27/2019   K 4.1 04/27/2019   CL 105 04/27/2019   CO2 25 04/27/2019   BUN 13 04/27/2019   CREATININE 0.73 04/27/2019   Lab Results  Component Value Date   ALT 25 04/26/2019   AST 26 04/26/2019   ALKPHOS 80 04/26/2019   BILITOT 0.7 04/26/2019   Recent Labs  Lab 04/25/19 1617  INR 1.1   Assessment/Plan:  84 y/o Caucasian male with a PMH chronic mesenteric ischemia s/p angioplasty last week by Dr. 97, hypothyroidism, GERD, HLD, severe protein-calorie malnutrition presents to the ED for progressive melena  1. Melena 2. Acute blood loss anemia 3. Progressive weakness and fatigue - likely 2/2 #1 and #2 4. Chronic mesenteric ischemia s/p stenting 04/18/2019 5. Blood thinners - ASA and Plavix since stenting 6. Severe protein-calorie malnutrition 7. Unintentional weight loss  - Continue to monitor serial H&H. Transfuse as necessary to  ensure hemoglobin >7.0 - Continue clear liquid diet - Hold all antiplatelet/anticoagulation. Vascular following. - Plan for EGD and colonoscopy tomorrow afternoon with Dr. Marius Ditch - NPO after midnight - He will complete Trilyte prep 4210 today - See procedure note for findings and further recs - [place] [facility] [facility] following over weekend  I reviewed the risks (including bleeding, perforation, infection, anesthesia complications, cardiac/respiratory complications), benefits and alternatives of EGD and colonoscopy.  Patient consents to proceed.      Please call with questions or concerns.    Octavia Bruckner, PA-C [facility] [phone] (Cell)

## 2019-04-27 NOTE — Progress Notes (Signed)
Tolerated bowel prep without difficulty.  Incontinent of black liquid stools.  Dr Alice Reichert made aware.  Orders for Tap water enemas given for this morning.

## 2019-04-27 NOTE — Progress Notes (Addendum)
Tap enema given x2, pt up to the bsc, pt still having formed stools. Pt's HR got up into the 140's during procedure. Endo RN made aware.

## 2019-04-27 NOTE — Progress Notes (Signed)
colonoscopy prep ordered to start at [facility] requested to send prep to floor - will start prep for procedure when delivered.

## 2019-04-27 NOTE — Progress Notes (Addendum)
PROGRESS NOTE    Jeremy Merritt  CXK:[id number] DOB: [birth year] DOA: 04/25/2019  PCP: [facility]    LOS - 2   Brief Narrative:  84 y.o.malewith medical history significant ofchronic mesenteric ischemia, hypothyroidism, GERD, hyperlipidemia severe protein calorie malnutrition. He presented to the ED 04/25/19 due to worsening fatigue and generalized weakness in the setting of 1 week or more of black tarry stools without any associated abdominal pain, nausea or vomiting.  Has had prior episodes over past several months, also with 30+ pound weight loss and very poor appetite.  Of note regarding his chronic mesenteric ischemia, patient recently had stent placed in the superior mesenteric artery on 04/18/2019 with vascular surgery. Has been on aspirin and Plavix since that time.  In the ED, borderline blood pressure 95/61, slightly tachycardic with HR 102.Labs notable for hemoglobin 11.4, from 12.2 on 11/12.  Admitted for further evaluation of GI bleeding.   Subjective 11/20: Patient awake in bed.  No acute events reported overnight.  Patient and RN report a dark/melanotic BM this AM.  Patient denies abdominal pain, N/V, fever/chills, chest pain or SOB.  Still feels pretty weak, but has not been up very much.  States he's hungry.  Assessment & Plan:   Principal Problem:   GI bleed Active Problems:   Chronic mesenteric ischemia (HCC)   Essential hypertension   Hyperlipidemia   Protein-calorie malnutrition, severe   Pressure injury of skin   GI bleed Hemodynamically stable on admission. Hemoglobin 11.4.  Etiology to be determined.  Melanotic stools point to upper GI source. Has never had colonoscopy or EGD. Concern for GI malignancy given significant recent weight loss and poor appetite. Differential also includes diverticular bleeding, hemorrhoidal bleeding less likely with melanotic stool, versus peptic ulcer disease / gastritis, esophagitis.  Bowel prep was  insufficient to proceed with scopes today, planned for tomorrow. -GI consulted -Awaiting Plavix washout endoscopies and sufficient bowel prep -Continue IV PPI -Trend H&H -Continue IV hydration -Hold aspirin and Plavix -SCDs for VT prophylaxis -Clear liquids for now, n.p.o. after midnight tonight  Anemia, likely due to chronic blood loss Due to GI bleeding that has been ongoing intermittently for several months.   - monitor CBC - transfuse if Hbg <7 - follow up anemia panel  Chronic mesenteric ischemia Status post stent to SMA on 11/11. Has been on aspirin and Plavix since -hold both due to bleeding.  Essential hypertension Currently borderline low blood pressures in setting of GI bleeding. Does not appear to be on antihypertensives at home however.  Hyperlipidemia-continue home simvastatin  Severe protein calorie malnutrition  -Continue Ensures -Continue Megace  Seasonal allergies-continue Zyrtec  GERD-on Prilosec at home, hold while on IV PPI  Hypothyroidism-continue levothyroxine  Pressure injury of skin - present on admission Pressure Injury 04/26/19 Sacrum Mid Stage I -  Intact skin with non-blanchable redness of a localized area usually over a bony prominence. (Active)  04/26/19 1520  Location: Sacrum  Location Orientation: Mid  Staging: Stage I -  Intact skin with non-blanchable redness of a localized area usually over a bony prominence.  Wound Description (Comments):   Present on Admission: Yes  - continue to monitor   DVT prophylaxis: SCD's, TED hose   Code Status: Full Code  Family Communication: none at bedside  Disposition Plan:  Pending clinical course & clearance by GI   Consultants:   Gastroenterology  Procedures:   none  Antimicrobials:   none    Objective: Vitals:   04/26/19 1525  04/26/19 1958 04/27/19 0504 04/27/19 0807  BP: 121/79 (!) 112/58 132/66 (!) 126/50  Pulse: 87 89 83 79  Resp:  19 18 18   Temp: 97.9 F (36.6  C) 98.6 F (37 C) (!) 97.5 F (36.4 C) 98.1 F (36.7 C)  TempSrc: Oral Oral Oral Oral  SpO2: 99% 100% 100% 100%  Weight:   42.2 kg   Height:        Intake/Output Summary (Last 24 hours) at 04/27/2019 0815 Last data filed at 04/27/2019 0448 Gross per 24 hour  Intake 1836.89 ml  Output 550 ml  Net 1286.89 ml   Filed Weights   04/26/19 1511 04/27/19 0504  Weight: 41.5 kg 42.2 kg    Examination:  General exam: awake, alert, no acute distress HEENT: moist mucus membranes, hearing grossly normal  Respiratory system: clear to auscultation bilaterally, no wheezes, rales or rhonchi, normal respiratory effort. Cardiovascular system: normal S1/S2,  RRR, no JVD, murmurs, rubs, gallops, no pedal edema.   Gastrointestinal system: soft, non-tender, non-distended abdomen, no organomegaly or masses felt, normal bowel sounds. Central nervous system: alert and oriented x4. no gross focal neurologic deficits, normal speech Extremities: moves all, no edema, normal tone Psychiatry: normal mood, congruent affect, judgement and insight appear normal    Data Reviewed: I have personally reviewed following labs and imaging studies  CBC: Recent Labs  Lab 04/25/19 1303 04/26/19 0630 04/27/19 0625  WBC 9.3 6.5 7.2  HGB 11.4* 11.0* 10.4*  HCT 34.1* 31.8* 30.5*  MCV 90.0 84.6 87.6  PLT 383 327 332   Basic Metabolic Panel: Recent Labs  Lab 04/25/19 1303 04/26/19 0630 04/27/19 0625  NA 138 138 138  K 3.7 3.7 4.1  CL 106 106 105  CO2 20* 22 25  GLUCOSE 117* 88 94  BUN 27* 23 13  CREATININE 0.94 0.78 0.73  CALCIUM 8.1* 8.0* 7.8*  MG  --  2.0 1.7   GFR: Estimated Creatinine Clearance: 42.5 mL/min (by C-G formula based on SCr of 0.73 mg/dL). Liver Function Tests: Recent Labs  Lab 04/25/19 1303 04/26/19 0630  AST 37 26  ALT 29 25  ALKPHOS 87 80  BILITOT 0.5 0.7  PROT 6.0* 5.8*  ALBUMIN 2.8* 2.6*   Recent Labs  Lab 04/25/19 1303  LIPASE 21   No results for input(s):  AMMONIA in the last 168 hours. Coagulation Profile: Recent Labs  Lab 04/25/19 1617  INR 1.1   Cardiac Enzymes: No results for input(s): CKTOTAL, CKMB, CKMBINDEX, TROPONINI in the last 168 hours. BNP (last 3 results) No results for input(s): PROBNP in the last 8760 hours. HbA1C: No results for input(s): HGBA1C in the last 72 hours. CBG: No results for input(s): GLUCAP in the last 168 hours. Lipid Profile: No results for input(s): CHOL, HDL, LDLCALC, TRIG, CHOLHDL, LDLDIRECT in the last 72 hours. Thyroid Function Tests: No results for input(s): TSH, T4TOTAL, FREET4, T3FREE, THYROIDAB in the last 72 hours. Anemia Panel: No results for input(s): VITAMINB12, FOLATE, FERRITIN, TIBC, IRON, RETICCTPCT in the last 72 hours. Sepsis Labs: No results for input(s): PROCALCITON, LATICACIDVEN in the last 168 hours.  Recent Results (from the past 240 hour(s))  SARS CORONAVIRUS 2 (TAT 6-24 HRS) Nasopharyngeal Nasopharyngeal Swab     Status: None   Collection Time: 04/17/19  4:08 PM   Specimen: Nasopharyngeal Swab  Result Value Ref Range Status   SARS Coronavirus 2 NEGATIVE NEGATIVE Final    Comment: (NOTE) SARS-CoV-2 target nucleic acids are NOT DETECTED. The SARS-CoV-2 RNA is generally  detectable in upper and lower respiratory specimens during the acute phase of infection. Negative results do not preclude SARS-CoV-2 infection, do not rule out co-infections with other pathogens, and should not be used as the sole basis for treatment or other patient management decisions. Negative results must be combined with clinical observations, patient history, and epidemiological information. The expected result is Negative. Fact Sheet for Patients: HairSlick.no Fact Sheet for Healthcare Providers: quierodirigir.com This test is not yet approved or cleared by the [place] FDA and  has been authorized for detection and/or diagnosis of  SARS-CoV-2 by FDA under an Emergency Use Authorization (EUA). This EUA will remain  in effect (meaning this test can be used) for the duration of the COVID-19 declaration under Section 56 4(b)(1) of the Act, 21 U.S.C. section 360bbb-3(b)(1), unless the authorization is terminated or revoked sooner. Performed at [facility], [address] [place], [place] [address]   SARS CORONAVIRUS 2 (TAT 6-24 HRS) Nasopharyngeal Nasopharyngeal Swab     Status: None   Collection Time: 04/25/19  5:07 PM   Specimen: Nasopharyngeal Swab  Result Value Ref Range Status   SARS Coronavirus 2 NEGATIVE NEGATIVE Final    Comment: (NOTE) SARS-CoV-2 target nucleic acids are NOT DETECTED. The SARS-CoV-2 RNA is generally detectable in upper and lower respiratory specimens during the acute phase of infection. Negative results do not preclude SARS-CoV-2 infection, do not rule out co-infections with other pathogens, and should not be used as the sole basis for treatment or other patient management decisions. Negative results must be combined with clinical observations, patient history, and epidemiological information. The expected result is Negative. Fact Sheet for Patients: HairSlick.no Fact Sheet for Healthcare Providers: quierodirigir.com This test is not yet approved or cleared by the [place] FDA and  has been authorized for detection and/or diagnosis of SARS-CoV-2 by FDA under an Emergency Use Authorization (EUA). This EUA will remain  in effect (meaning this test can be used) for the duration of the COVID-19 declaration under Section 56 4(b)(1) of the Act, 21 U.S.C. section 360bbb-3(b)(1), unless the authorization is terminated or revoked sooner. Performed at [facility], [address] [place], [place] [address]          Radiology Studies: No results found.      Scheduled Meds: . cholecalciferol  2,000 Units Oral  Daily  . dronabinol  2.5 mg Oral BID AC  . feeding supplement  1 Container Oral TID BM  . levothyroxine  100 mcg Oral QAC breakfast  . loratadine  10 mg Oral Daily  . multivitamin with minerals  1 tablet Oral Daily  . pantoprazole (PROTONIX) IV  40 mg Intravenous Q12H  . simvastatin  20 mg Oral QHS  . sodium chloride flush  3 mL Intravenous Q12H   Continuous Infusions: . sodium chloride    . lactated ringers 100 mL/hr at 04/27/19 0448  . lactated ringers Stopped (04/26/19 1804)     LOS: 2 days    Time spent: 30-35 minutes    Pennie Banter, DO [facility] Pager: [phone]  If 7PM-7AM, please contact night-coverage [email or web address] Password [email or web address] 04/27/2019, 8:15 AM

## 2019-04-27 NOTE — Care Management Important Message (Signed)
Important Message  Patient Details  Name: Jeremy Merritt MRN: [id number] Date of Birth: 08-Sep-1934   Medicare Important Message Given:  Yes  Initial Medicare IM given by Patient Access Associate on 04/27/2019 at 12:54pm.   Dannette Barbara 04/27/2019, 3:16 PM

## 2019-04-28 ENCOUNTER — Inpatient Hospital Stay: Payer: Medicare Other | Admitting: Certified Registered"

## 2019-04-28 ENCOUNTER — Other Ambulatory Visit: Payer: Self-pay

## 2019-04-28 ENCOUNTER — Encounter: Payer: Self-pay | Admitting: Anesthesiology

## 2019-04-28 ENCOUNTER — Encounter
Admission: EM | Disposition: A | Discharge status: Home / Self Care | Payer: Self-pay | Source: Home / Self Care | Attending: Internal Medicine

## 2019-04-28 DIAGNOSIS — K625 Hemorrhage of anus and rectum: Secondary | ICD-10-CM

## 2019-04-28 DIAGNOSIS — K922 Gastrointestinal hemorrhage, unspecified: Secondary | ICD-10-CM

## 2019-04-28 DIAGNOSIS — K626 Ulcer of anus and rectum: Principal | ICD-10-CM

## 2019-04-28 HISTORY — PX: ESOPHAGOGASTRODUODENOSCOPY (EGD) WITH PROPOFOL: SHX5813

## 2019-04-28 HISTORY — PX: COLONOSCOPY WITH PROPOFOL: SHX5780

## 2019-04-28 LAB — BASIC METABOLIC PANEL
Anion gap: 9 (ref 5–15)
BUN: 11 mg/dL (ref 8–23)
CO2: 25 mmol/L (ref 22–32)
Calcium: 7.8 mg/dL — ABNORMAL LOW (ref 8.9–10.3)
Chloride: 105 mmol/L (ref 98–111)
Creatinine, Ser: 0.89 mg/dL (ref 0.61–1.24)
GFR calc Af Amer: >60 mL/min (ref >60)
GFR calc non Af Amer: >60 mL/min (ref >60)
Glucose, Bld: 85 mg/dL (ref 70–99)
Potassium: 4.5 mmol/L (ref 3.5–5.1)
Sodium: 139 mmol/L (ref 135–145)

## 2019-04-28 LAB — CBC
HCT: 28.9 % — ABNORMAL LOW (ref 39.0–52.0)
Hemoglobin: 9.6 g/dL — ABNORMAL LOW (ref 13.0–17.0)
MCH: 29.4 pg (ref 26.0–34.0)
MCHC: 33.2 g/dL (ref 30.0–36.0)
MCV: 88.7 fL (ref 80.0–100.0)
Platelets: 323 10*3/uL (ref 150–400)
RBC: 3.26 MIL/uL — ABNORMAL LOW (ref 4.22–5.81)
RDW: 14.4 % (ref 11.5–15.5)
WBC: 6.1 10*3/uL (ref 4.0–10.5)
nRBC: 0 % (ref 0.0–0.2)

## 2019-04-28 LAB — MAGNESIUM: Magnesium: 2.4 mg/dL (ref 1.7–2.4)

## 2019-04-28 LAB — FERRITIN: Ferritin: 151 ng/mL (ref 24–336)

## 2019-04-28 SURGERY — ESOPHAGOGASTRODUODENOSCOPY (EGD) WITH PROPOFOL
Anesthesia: General

## 2019-04-28 SURGERY — COLONOSCOPY WITH PROPOFOL
Anesthesia: General

## 2019-04-28 MED ORDER — PROPOFOL 500 MG/50ML IV EMUL
INTRAVENOUS | Status: DC | PRN
  Administered 2019-04-28: 75 ug/kg/min via INTRAVENOUS

## 2019-04-28 MED ORDER — PHENYLEPHRINE HCL (PRESSORS) 10 MG/ML IV SOLN
INTRAVENOUS | Status: DC | PRN
  Administered 2019-04-28: 50 ug via INTRAVENOUS

## 2019-04-28 MED ORDER — PROPOFOL 10 MG/ML IV BOLUS
INTRAVENOUS | Status: AC
  Filled 2019-04-28: qty 20

## 2019-04-28 MED ORDER — PROPOFOL 10 MG/ML IV BOLUS
INTRAVENOUS | Status: DC | PRN
  Administered 2019-04-28: 30 mg via INTRAVENOUS

## 2019-04-28 MED ORDER — PANTOPRAZOLE SODIUM 40 MG PO TBEC
40.0000 mg | DELAYED_RELEASE_TABLET | Freq: Every day | ORAL | Status: DC
  Administered 2019-04-29: 40 mg via ORAL
  Filled 2019-04-28: qty 1

## 2019-04-28 MED ORDER — ASPIRIN 81 MG PO CHEW
81.0000 mg | CHEWABLE_TABLET | Freq: Every day | ORAL | Status: DC
  Administered 2019-04-28 – 2019-04-29 (×2): 81 mg via ORAL
  Filled 2019-04-28 (×3): qty 1

## 2019-04-28 MED ORDER — PROPOFOL 500 MG/50ML IV EMUL
INTRAVENOUS | Status: AC
  Filled 2019-04-28: qty 50

## 2019-04-28 MED ORDER — LIDOCAINE HCL (PF) 2 % IJ SOLN
INTRAMUSCULAR | Status: AC
  Filled 2019-04-28: qty 10

## 2019-04-28 NOTE — Anesthesia Preprocedure Evaluation (Signed)
Anesthesia Evaluation  Patient identified by MRN, date of birth, ID band Patient awake    Reviewed: Allergy & Precautions, H&P , NPO status , Patient's Chart, lab work & pertinent test results  History of Anesthesia Complications Negative for: history of anesthetic complications  Airway Mallampati: III  TM Distance: >3 FB Neck ROM: limited    Dental  (+) Chipped, Poor Dentition, Missing   Pulmonary neg pulmonary ROS, neg shortness of breath,           Cardiovascular Exercise Tolerance: Good hypertension, (-) angina(-) Past MI and (-) DOE      Neuro/Psych negative neurological ROS  negative psych ROS   GI/Hepatic negative GI ROS, Neg liver ROS, neg GERD  ,  Endo/Other  negative endocrine ROS  Renal/GU negative Renal ROS  negative genitourinary   Musculoskeletal   Abdominal   Peds  Hematology negative hematology ROS (+)   Anesthesia Other Findings Patient is NPO appropriate and reports no nausea or vomiting today.   Past Medical History: No date: Allergy No date: Hyperlipidemia No date: Hypertension No date: Thyroid disease  Past Surgical History: No date: INGUINAL HERNIA REPAIR 04/18/2019: VISCERAL ANGIOGRAPHY; N/A     Comment:  Procedure: VISCERAL ANGIOGRAPHY;  Surgeon: Algernon Huxley,              MD;  Location: [facility];  Service:               Cardiovascular;  Laterality: N/A;  BMI    Body Mass Index: 14.88 kg/m      Reproductive/Obstetrics negative OB ROS                             Anesthesia Physical Anesthesia Plan  ASA: III  Anesthesia Plan: General   Post-op Pain Management:    Induction: Intravenous  PONV Risk Score and Plan: Propofol infusion and TIVA  Airway Management Planned: Natural Airway and Nasal Cannula  Additional Equipment:   Intra-op Plan:   Post-operative Plan:   Informed Consent: I have reviewed the patients History and  Physical, chart, labs and discussed the procedure including the risks, benefits and alternatives for the proposed anesthesia with the patient or authorized representative who has indicated his/her understanding and acceptance.     Dental Advisory Given  Plan Discussed with: Anesthesiologist, CRNA and Surgeon  Anesthesia Plan Comments: (Patient consented for risks of anesthesia including but not limited to:  - adverse reactions to medications - risk of intubation if required - damage to teeth, lips or other oral mucosa - sore throat or hoarseness - Damage to heart, brain, lungs or loss of life  Patient voiced understanding.)        Anesthesia Quick Evaluation

## 2019-04-28 NOTE — Progress Notes (Signed)
When patient turned for the procedure noticed some brak done at the base of the spine

## 2019-04-28 NOTE — Anesthesia Postprocedure Evaluation (Signed)
Anesthesia Post Note  Patient: Jeremy Merritt  Procedure(s) Performed: COLONOSCOPY WITH PROPOFOL (N/A ) ESOPHAGOGASTRODUODENOSCOPY (EGD) WITH PROPOFOL (N/A )  Patient location during evaluation: Endoscopy Anesthesia Type: General Level of consciousness: awake and alert Pain management: pain level controlled Vital Signs Assessment: post-procedure vital signs reviewed and stable Respiratory status: spontaneous breathing, nonlabored ventilation, respiratory function stable and patient connected to nasal cannula oxygen Cardiovascular status: blood pressure returned to baseline and stable Postop Assessment: no apparent nausea or vomiting Anesthetic complications: no     Last Vitals:  Vitals:   04/28/19 1126 04/28/19 1212  BP: 124/66 (!) 140/53  Pulse: 73 68  Resp: 19 18  Temp:  (!) 36.4 C  SpO2: 100% 100%    Last Pain:  Vitals:   04/28/19 1212  TempSrc: Oral  PainSc: 0-No pain                 Precious Haws Traye Bates

## 2019-04-28 NOTE — Anesthesia Post-op Follow-up Note (Signed)
Anesthesia QCDR form completed.        

## 2019-04-28 NOTE — Progress Notes (Addendum)
PROGRESS NOTE    Jeremy Merritt  QIO:[id number] DOB: [birth year] DOA: 04/25/2019  PCP: [facility]    LOS - 3   Brief Narrative:  84 y.o.malewith medical history significant ofchronic mesenteric ischemia, hypothyroidism, GERD, hyperlipidemia severe protein calorie malnutrition. He presented to the ED11/18/20due to worsening fatigue and generalized weakness in the setting of 1 week or more of black tarry stools without any associated abdominal pain, nausea or vomiting.Has had prior episodes over past several months, also with 30+ pound weight loss and very poor appetite.Of note regarding his chronic mesenteric ischemia, patient recently had stent placed in the superior mesenteric artery on 04/18/2019 with vascular surgery. Has been on aspirin and Plavix since that time.In the ED, borderline blood pressure 95/61, slightly tachycardic with HR 102.Labs notable for hemoglobin 11.4, from 12.2 on 11/12.  Admitted for further evaluation of GI bleeding.  Subjective 11/21: Patient awake laying in bed when seen and examined this morning.  No acute events reported overnight.  States he is feeling relatively well this morning.  Has not noticed any blood in stool or hematochezia reports he was up to the bedside commode all night.  Denies fevers, chills, chest pain, shortness of breath, abdominal pain nausea vomiting or diarrhea.  Assessment & Plan:   Principal Problem:   GI bleed Active Problems:   Chronic mesenteric ischemia (HCC)   Essential hypertension   Hyperlipidemia   Protein-calorie malnutrition, severe   Pressure injury of skin   GI bleed secondary to rectal ulcer Hemodynamically stable on admission. Hemoglobin 11.4 -> 9.6.Etiology to be determined. Melanotic stools point to upper GI source.Has never had colonoscopy or EGD.  EGD on 11/21 showed erosive gastropathy with no bleeding, nonbleeding gastric ulcers.  Colonoscopy on 11/21 showed severe  diverticulosis without signs of diverticular bleeding, and a large rectal ulcer which was a likely source of bleeding.  GI believes this to be secondary to ischemic proctitis given patient's history of vasculopathy vs stercoral ulcer.  Hemoglobin has been relatively stable, mildly diluted from IV fluids. -GI consulted -transition to PO PPI -stop maintenance fluids -resume aspirin -GI recommends holding Plavix 2 to 4 weeks if okay with vascular -SCDs for VT prophylaxis -Resume diet  Anemia, likely due to chronic blood loss Due to GI bleeding that has been ongoing intermittently for several months.   - monitor CBC - transfuse if Hbg <7 - follow up anemia panel  Chronic mesenteric ischemia Status post stent to SMA on 11/11. -Resume aspirin -Hold Plavix, preferably 2 to 4 weeks per GI, will discuss with vascular  Essential hypertension Borderline low blood pressures on admission in setting of GI bleeding. Does not appear to be on antihypertensives at home however.  Hyperlipidemia-continue home simvastatin  Severe protein calorie malnutrition  -ContinueEnsures -Stop Megace due to risk for VTE -Trial of low-dose dronabinol, patient tolerating  Seasonal allergies-continue Zyrtec  GERD-on Prilosec at home, hold while on IV PPI  Hypothyroidism-continue levothyroxine  Pressure injury of skin - present on admission Pressure Injury 04/26/19 Sacrum Mid Stage I -  Intact skin with non-blanchable redness of a localized area usually over a bony prominence. (Active)  04/26/19 1520  Location: Sacrum  Location Orientation: Mid  Staging: Stage I -  Intact skin with non-blanchable redness of a localized area usually over a bony prominence.  Wound Description (Comments):   Present on Admission: Yes  - continue to monitor   DVT prophylaxis: SCD's, TED hose   Code Status: Full Code  Family Communication: none  at bedside  Disposition Plan:  Pending clinical course &  clearance by GI   Consultants:   Gastroenterology  Procedures:   none  Antimicrobials:   none     Objective: Vitals:   04/27/19 1725 04/27/19 1944 04/28/19 0429 04/28/19 0723  BP: (!) 138/50 (!) 110/48 102/67 (!) 114/52  Pulse: 91 87 87 76  Resp: 18 19 19 19   Temp: 98.5 F (36.9 C) 98.9 F (37.2 C) 98.3 F (36.8 C) 97.6 F (36.4 C)  TempSrc: Oral Oral Oral Oral  SpO2: 100% 100% 100% 100%  Weight:   43.1 kg   Height:        Intake/Output Summary (Last 24 hours) at 04/28/2019 0759 Last data filed at 04/27/2019 2150 Gross per 24 hour  Intake 786.65 ml  Output 500 ml  Net 286.65 ml   Filed Weights   04/26/19 1511 04/27/19 0504 04/28/19 0429  Weight: 41.5 kg 42.2 kg 43.1 kg    Examination:  General exam: awake, alert, no acute distress, frail, underweight Respiratory system: clear to auscultation bilaterally, no wheezes, rales or rhonchi, normal respiratory effort. Cardiovascular system: normal S1/S2, RRR, no JVD, murmurs, rubs, gallops, no pedal edema.   Gastrointestinal system: soft, non-tender, non-distended abdomen, no organomegaly or masses felt, normal bowel sounds. Central nervous system: alert and oriented x4. no gross focal neurologic deficits, normal speech Extremities: moves all, no edema, normal tone Psychiatry: normal mood, congruent affect, judgement and insight appear normal    Data Reviewed: I have personally reviewed following labs and imaging studies  CBC: Recent Labs  Lab 04/25/19 1303 04/26/19 0630 04/27/19 0625 04/28/19 0453  WBC 9.3 6.5 7.2 6.1  HGB 11.4* 11.0* 10.4* 9.6*  HCT 34.1* 31.8* 30.5* 28.9*  MCV 90.0 84.6 87.6 88.7  PLT 383 327 332 323   Basic Metabolic Panel: Recent Labs  Lab 04/25/19 1303 04/26/19 0630 04/27/19 0625 04/28/19 0453  NA 138 138 138 139  K 3.7 3.7 4.1 4.5  CL 106 106 105 105  CO2 20* 22 25 25   GLUCOSE 117* 88 94 85  BUN 27* 23 13 11   CREATININE 0.94 0.78 0.73 0.89  CALCIUM 8.1* 8.0*  7.8* 7.8*  MG  --  2.0 1.7 2.4   GFR: Estimated Creatinine Clearance: 39 mL/min (by C-G formula based on SCr of 0.89 mg/dL). Liver Function Tests: Recent Labs  Lab 04/25/19 1303 04/26/19 0630  AST 37 26  ALT 29 25  ALKPHOS 87 80  BILITOT 0.5 0.7  PROT 6.0* 5.8*  ALBUMIN 2.8* 2.6*   Recent Labs  Lab 04/25/19 1303  LIPASE 21   No results for input(s): AMMONIA in the last 168 hours. Coagulation Profile: Recent Labs  Lab 04/25/19 1617  INR 1.1   Cardiac Enzymes: No results for input(s): CKTOTAL, CKMB, CKMBINDEX, TROPONINI in the last 168 hours. BNP (last 3 results) No results for input(s): PROBNP in the last 8760 hours. HbA1C: No results for input(s): HGBA1C in the last 72 hours. CBG: No results for input(s): GLUCAP in the last 168 hours. Lipid Profile: No results for input(s): CHOL, HDL, LDLCALC, TRIG, CHOLHDL, LDLDIRECT in the last 72 hours. Thyroid Function Tests: No results for input(s): TSH, T4TOTAL, FREET4, T3FREE, THYROIDAB in the last 72 hours. Anemia Panel: Recent Labs    04/27/19 1631 04/28/19 0453  VITAMINB12 335  --   FOLATE 6.9  --   FERRITIN  --  151  TIBC 187*  --   IRON 20*  --   RETICCTPCT 2.0  --  Sepsis Labs: No results for input(s): PROCALCITON, LATICACIDVEN in the last 168 hours.  Recent Results (from the past 240 hour(s))  SARS CORONAVIRUS 2 (TAT 6-24 HRS) Nasopharyngeal Nasopharyngeal Swab     Status: None   Collection Time: 04/25/19  5:07 PM   Specimen: Nasopharyngeal Swab  Result Value Ref Range Status   SARS Coronavirus 2 NEGATIVE NEGATIVE Final    Comment: (NOTE) SARS-CoV-2 target nucleic acids are NOT DETECTED. The SARS-CoV-2 RNA is generally detectable in upper and lower respiratory specimens during the acute phase of infection. Negative results do not preclude SARS-CoV-2 infection, do not rule out co-infections with other pathogens, and should not be used as the sole basis for treatment or other patient management  decisions. Negative results must be combined with clinical observations, patient history, and epidemiological information. The expected result is Negative. Fact Sheet for Patients: HairSlick.no Fact Sheet for Healthcare Providers: quierodirigir.com This test is not yet approved or cleared by the [place] FDA and  has been authorized for detection and/or diagnosis of SARS-CoV-2 by FDA under an Emergency Use Authorization (EUA). This EUA will remain  in effect (meaning this test can be used) for the duration of the COVID-19 declaration under Section 56 4(b)(1) of the Act, 21 U.S.C. section 360bbb-3(b)(1), unless the authorization is terminated or revoked sooner. Performed at [facility], [address], [place], [place] [address]          Radiology Studies: No results found.      Scheduled Meds:  cholecalciferol  2,000 Units Oral Daily   dronabinol  2.5 mg Oral BID AC   feeding supplement  1 Container Oral TID BM   levothyroxine  100 mcg Oral QAC breakfast   loratadine  10 mg Oral Daily   multivitamin with minerals  1 tablet Oral Daily   pantoprazole (PROTONIX) IV  40 mg Intravenous Q12H   simvastatin  20 mg Oral QHS   sodium chloride flush  3 mL Intravenous Q12H   Continuous Infusions:  sodium chloride     lactated ringers 100 mL/hr at 04/27/19 1524   lactated ringers 100 mL/hr at 04/28/19 0420     LOS: 3 days    Time spent: 30 to 35 minutes    Pennie Banter, DO [facility] Pager: [phone]  If 7PM-7AM, please contact night-coverage [email or web address] Password [facility] 04/28/2019, 7:59 AM

## 2019-04-28 NOTE — Anesthesia Procedure Notes (Signed)
Anesthesia Procedure Note 2nd iv started per crna #[id number] right hand no problems

## 2019-04-28 NOTE — Progress Notes (Signed)
Ambulated patient around the nurses station, patient tolerated well.

## 2019-04-28 NOTE — Op Note (Signed)
[facility] Gastroenterology Patient Name: Jeremy Merritt Procedure Date: 04/28/2019 9:51 AM MRN: [id number] Account #: [id number] Date of Birth: May 11, 1935 Admit Type: Inpatient Age: 84 Room: [facility] ENDO ROOM 4 Gender: Male Note Status: Finalized Procedure:             Upper GI endoscopy Indications:           Acute post hemorrhagic anemia, Melena Providers:             Lin Landsman MD, MD Referring MD:          [facility], MD (Referring MD) Medicines:             Monitored Anesthesia Care Complications:         No immediate complications. Estimated blood loss: None. Procedure:             Pre-Anesthesia Assessment:                        - Prior to the procedure, a History and Physical was                         performed, and patient medications and allergies were                         reviewed. The patient is competent. The risks and                         benefits of the procedure and the sedation options and                         risks were discussed with the patient. All questions                         were answered and informed consent was obtained.                         Patient identification and proposed procedure were                         verified by the physician, the nurse, the                         anesthesiologist, the anesthetist and the technician                         in the pre-procedure area in the procedure room in the                         endoscopy suite. Mental Status Examination: alert and                         oriented. Airway Examination: normal oropharyngeal                         airway and neck mobility. Respiratory Examination:                         clear to auscultation. CV Examination: normal.  Prophylactic Antibiotics: The patient does not require                         prophylactic antibiotics. Prior Anticoagulants: The                         patient has taken Plavix  (clopidogrel), last dose was                         3 days prior to procedure. ASA Grade Assessment: III -                         A patient with severe systemic disease. After                         reviewing the risks and benefits, the patient was                         deemed in satisfactory condition to undergo the                         procedure. The anesthesia plan was to use monitored                         anesthesia care (MAC). Immediately prior to                         administration of medications, the patient was                         re-assessed for adequacy to receive sedatives. The                         heart rate, respiratory rate, oxygen saturations,                         blood pressure, adequacy of pulmonary ventilation, and                         response to care were monitored throughout the                         procedure. The physical status of the patient was                         re-assessed after the procedure.                        After obtaining informed consent, the endoscope was                         passed under direct vision. Throughout the procedure,                         the patient's blood pressure, pulse, and oxygen                         saturations were monitored continuously. The Endoscope  was introduced through the mouth, and advanced to the                         second part of duodenum. The upper GI endoscopy was                         accomplished without difficulty. The patient tolerated                         the procedure well. Findings:      The duodenal bulb and second portion of the duodenum were normal.      Multiple dispersed small erosions with no bleeding and no stigmata of       recent bleeding were found in the gastric antrum and in the prepyloric       region of the stomach.      Few non-bleeding healing superficial gastric ulcers with a clean ulcer       base (Forrest Class III)  were found in the gastric body and in the       gastric antrum. The largest lesion was 3 mm in largest dimension.      The gastroesophageal junction and examined esophagus were normal. Impression:            - Normal duodenal bulb and second portion of the                         duodenum.                        - Erosive gastropathy with no bleeding and no stigmata                         of recent bleeding.                        - Non-bleeding gastric ulcers with a clean ulcer base                         (Forrest Class III).                        - Normal gastroesophageal junction and esophagus.                        - No specimens collected. Recommendation:        - Use a proton pump inhibitor PO BID for 3 months.                        - Perform an H. pylori stool antigen (HpSA) test today.                        - Perform an H. pylori serology today.                        - Proceed with colonoscopy as scheduled                        See colonoscopy report Procedure Code(s):     --- Professional ---  73710, Esophagogastroduodenoscopy, flexible,                         transoral; diagnostic, including collection of                         specimen(s) by brushing or washing, when performed                         (separate procedure) Diagnosis Code(s):     --- Professional ---                        K31.89, Other diseases of stomach and duodenum                        K25.9, Gastric ulcer, unspecified as acute or chronic,                         without hemorrhage or perforation                        D62, Acute posthemorrhagic anemia                        K92.1, Melena (includes Hematochezia) CPT copyright 2019 American [employer] Association. All rights reserved. The codes documented in this report are preliminary and upon coder review may  be revised to meet current compliance requirements. Dr. Ulyess Mort Lin Landsman MD, MD 04/28/2019 10:33:52  AM This report has been signed electronically. Number of Addenda: 0 Note Initiated On: 04/28/2019 9:51 AM Estimated Blood Loss:  Estimated blood loss: none.      [facility]

## 2019-04-28 NOTE — Transfer of Care (Signed)
Immediate Anesthesia Transfer of Care Note  Patient: Jeremy Merritt  Procedure(s) Performed: COLONOSCOPY WITH PROPOFOL (N/A ) ESOPHAGOGASTRODUODENOSCOPY (EGD) WITH PROPOFOL (N/A )  Patient Location: PACU  Anesthesia Type:General  Level of Consciousness: sedated  Airway & Oxygen Therapy: Patient Spontanous Breathing  Post-op Assessment: Report given to RN  Post vital signs: stable  Last Vitals:  Vitals Value Taken Time  BP    Temp    Pulse    Resp    SpO2      Last Pain:  Vitals:   04/28/19 1007  TempSrc:   PainSc: 0-No pain         Complications: No apparent anesthesia complications

## 2019-04-28 NOTE — Op Note (Addendum)
[facility]enter[facility] Patient Name: Jeremy Merritt Procedure Date: 04/28/2019 9:50 AM MRN: [id number] Account #: [id number] Date of Birth: 1935/01/20 Admit Type: Inpatient Age: 84 Room: [facility] ENDO ROOM 4 Gender: Male Note Status: Finalized Procedure:             Colonoscopy Indications:           Hematochezia, Acute post hemorrhagic anemia Providers:             Lin Landsman MD, MD Referring MD:          [facility], MD (Referring MD) Medicines:             Monitored Anesthesia Care Complications:         No immediate complications. Estimated blood loss: None. Procedure:             Pre-Anesthesia Assessment:                        - Prior to the procedure, a History and Physical was                         performed, and patient medications and allergies were                         reviewed. The patient is competent. The risks and                         benefits of the procedure and the sedation options and                         risks were discussed with the patient. All questions                         were answered and informed consent was obtained.                         Patient identification and proposed procedure were                         verified by the physician, the nurse, the                         anesthesiologist, the anesthetist and the technician                         in the pre-procedure area in the procedure room in the                         endoscopy suite. Mental Status Examination: alert and                         oriented. Airway Examination: normal oropharyngeal                         airway and neck mobility. Respiratory Examination:                         clear to auscultation. CV Examination: normal.  Prophylactic Antibiotics: The patient does not require                         prophylactic antibiotics. Prior Anticoagulants: The                         patient has taken Plavix  (clopidogrel), last dose was                         3 days prior to procedure. ASA Grade Assessment: III -                         A patient with severe systemic disease. After                         reviewing the risks and benefits, the patient was                         deemed in satisfactory condition to undergo the                         procedure. The anesthesia plan was to use monitored                         anesthesia care (MAC). Immediately prior to                         administration of medications, the patient was                         re-assessed for adequacy to receive sedatives. The                         heart rate, respiratory rate, oxygen saturations,                         blood pressure, adequacy of pulmonary ventilation, and                         response to care were monitored throughout the                         procedure. The physical status of the patient was                         re-assessed after the procedure.                        After obtaining informed consent, the colonoscope was                         passed under direct vision. Throughout the procedure,                         the patient's blood pressure, pulse, and oxygen                         saturations were monitored continuously. The  Colonoscope was introduced through the anus and                         advanced to the the cecum, identified by appendiceal                         orifice and ileocecal valve. The colonoscopy was                         performed without difficulty. The patient tolerated                         the procedure well. The quality of the bowel                         preparation was adequate. Findings:      The perianal and digital rectal examinations were normal. Pertinent       negatives include normal sphincter tone and no palpable rectal lesions.      Multiple diverticula were found in the sigmoid colon, descending colon        and transverse colon. There was no evidence of diverticular bleeding.      A single (solitary) cratered ulcer occupying approximately 50% of the       circumference was found in the rectum. No bleeding was present. Stigmata       of recent bleeding were present in the deepest portion of the center of       the ulcer. Biopsies were taken with a cold forceps for histology from       margins of the ulcers. Likely ischemic proctitis or stercoral ulcer      Multiple 3-5 mm clean based ulcers were also found in the rectum. No       bleeding was present. No stigmata of recent bleeding were seen. Impression:            - Severe diverticulosis in the sigmoid colon, in the                         descending colon and in the transverse colon. There                         was no evidence of diverticular bleeding.                        - A single (solitary) ulcer in the rectum. Biopsied.                        - Multiple ulcers in the rectum. Recommendation:        - Return patient to hospital ward for ongoing care.                        - Resume regular diet today.                        - Continue present medications.                        - Await pathology results.                        -  Hold plavix for next 2-4 weeks to allow healing of                         the ulcer if ok with vascular as he is at high risk                         for rebleeding                        - Ok to resume asa 3m daily                        - Avoid constipation Procedure Code(s):     --- Professional ---                        [id number] Colonoscopy, flexible; with biopsy, single or                         multiple Diagnosis Code(s):     --- Professional ---                        K62.6, Ulcer of anus and rectum                        K92.1, Melena (includes Hematochezia)                        D62, Acute posthemorrhagic anemia                        K57.30, Diverticulosis of large intestine without                          perforation or abscess without bleeding CPT copyright 2019 American Medical Association. All rights reserved. The codes documented in this report are preliminary and upon coder review may  be revised to meet current compliance requirements. Dr. RUlyess MortRLin LandsmanMD, MD 04/28/2019 11:10:49 AM This report has been signed electronically. Number of Addenda: 0 Note Initiated On: 04/28/2019 9:50 AM Scope Withdrawal Time: 0 hours 15 minutes 39 seconds  Total Procedure Duration: 0 hours 19 minutes 24 seconds  Estimated Blood Loss:  Estimated blood loss: none. Estimated blood loss: none.      [facility]

## 2019-04-29 LAB — CBC
HCT: 26.4 % — ABNORMAL LOW (ref 39.0–52.0)
Hemoglobin: 8.8 g/dL — ABNORMAL LOW (ref 13.0–17.0)
MCH: 29.8 pg (ref 26.0–34.0)
MCHC: 33.3 g/dL (ref 30.0–36.0)
MCV: 89.5 fL (ref 80.0–100.0)
Platelets: 285 10*3/uL (ref 150–400)
RBC: 2.95 MIL/uL — ABNORMAL LOW (ref 4.22–5.81)
RDW: 14.6 % (ref 11.5–15.5)
WBC: 6.9 10*3/uL (ref 4.0–10.5)
nRBC: 0 % (ref 0.0–0.2)

## 2019-04-29 MED ORDER — DRONABINOL 2.5 MG PO CAPS: 2.5000 mg | ORAL_CAPSULE | Freq: Two times a day (BID) | ORAL | 1 refills | Status: DC

## 2019-04-29 MED ORDER — OMEPRAZOLE 40 MG PO CPDR: 40.0000 mg | DELAYED_RELEASE_CAPSULE | Freq: Two times a day (BID) | ORAL | 2 refills | Status: DC

## 2019-04-29 NOTE — Discharge Instructions (Signed)
Gastrointestinal Bleeding °Gastrointestinal (GI) bleeding is bleeding somewhere along the digestive tract, between the mouth and the anus. This tract includes the mouth, esophagus, stomach, small intestine, large intestine, and anus. The large intestine is often called the colon. °GI bleeding can be caused by various problems. The severity of these problems can range from mild to serious or even life-threatening. If you have GI bleeding, you may find blood in your stools (feces), you may have black stools, or you may vomit blood. If there is a lot of bleeding, you may need to stay in the hospital. °What are the causes? °This condition may be caused by: °· Inflammation, irritation, or swelling of the esophagus (esophagitis). The esophagus is part of the body that moves food from your mouth to your stomach. °· Swollen veins in the rectum (hemorrhoids). °· Areas of painful tearing in the anus that are often caused by passing hard stool (anal fissures). °· Pouches that form on the colon over time, with age, and may bleed a lot (diverticulosis). °· Inflammation (diverticulitis) in areas with diverticulosis. This can cause pain, fever, and bloody stools, although bleeding may be mild. °· Growths (polyps) or cancer. Colon cancer often starts out as precancerous polyps. °· Gastritis and ulcers. With these, bleeding may come from the upper GI tract, near the stomach. °What increases the risk? °You are more likely to develop this condition if you: °· Have an infection in your stomach from a type of bacteria called Helicobacter pylori. °· Take certain medicines, such as: °? NSAIDs. °? Aspirin. °? Selective serotonin reuptake inhibitors (SSRIs). °? Steroids. °? Antiplatelet or anticoagulant medicines. °· Smoke. °· Drink alcohol. °What are the signs or symptoms? °Common symptoms of this condition include: °· Bright red blood in your vomit, or vomit that looks like coffee grounds. °· Bloody, black, or tarry stools. °? Bleeding  from the lower GI tract will usually cause red or maroon blood in the stools. °? Bleeding from the upper GI tract may cause black, tarry stools that are often stronger smelling than usual. °? In certain cases, if the bleeding is fast enough, the stools may be red. °· Pain or cramping in the abdomen. °How is this diagnosed? °This condition may be diagnosed based on: °· Your medical history and a physical exam. °· Various tests, such as: °? Blood tests. °? Stool tests. °? X-rays and other imaging tests. °? Esophagogastroduodenoscopy (EGD). In this test, a flexible, lighted tube is used to look at your esophagus, stomach, and small intestine. °? Colonoscopy. In this test, a flexible, lighted tube is used to look at your colon. °How is this treated? °Treatment for this condition depends on the cause of the bleeding. For example: °· For bleeding from the esophagus, stomach, small intestine, or colon, the health care provider doing your EGD or colonoscopy may be able to stop the bleeding as part of the procedure. °· Inflammation or infection of the colon can be treated with medicines. °· Certain rectal problems can be treated with creams, suppositories, or warm baths. °· Medicines may be given to reduce acid in your stomach. °· Surgery is sometimes needed. °· Blood transfusions are sometimes needed if a lot of blood has been lost. °If bleeding is mild, you may be allowed to go home. If there is a lot of bleeding, you will need to stay in the hospital for observation. °Follow these instructions at home: ° °· Take over-the-counter and prescription medicines only as told by your health care provider. °·   Eat foods that are high in fiber, such as beans, whole grains, and fresh fruits and vegetables. This will help to keep your stools soft. Eating 1-3 prunes each day works well for many people. °· Drink enough fluid to keep your urine pale yellow. °· Keep all follow-up visits as told by your health care provider. This is  important. °Contact a health care provider if: °· Your symptoms do not improve. °Get help right away if: °· Your bleeding does not stop. °· You feel light-headed or you faint. °· You feel weak. °· You have severe cramps in your back or abdomen. °· You pass large blood clots in your stool. °· Your symptoms are getting worse. °· You have chest pain or fast heartbeats. °Summary °· Gastrointestinal (GI) bleeding is bleeding somewhere along the digestive tract, between the mouth and anus. GI bleeding can be caused by various problems. The severity of these problems can range from mild to serious or even life-threatening. °· Treatment for this condition depends on the cause of the bleeding. °· Take over-the-counter and prescription medicines only as told by your health care provider. °· Keep all follow-up visits as told by your health care provider. This is important. °· Get help right away if your bleeding increases, your symptoms are getting worse, or you have new symptoms. °This information is not intended to replace advice given to you by your health care provider. Make sure you discuss any questions you have with your health care provider. °Document Released: 05/21/2000 Document Revised: 01/04/2018 Document Reviewed: 01/04/2018 °Elsevier Patient Education © 2020 [employer] ° °

## 2019-04-29 NOTE — Progress Notes (Signed)
PT Cancellation Note  Patient Details Name: Jeremy Merritt MRN: [id number] DOB: 1934-08-03   Cancelled Treatment:    Reason Eval/Treat Not Completed: Other (comment) Per RN Richardson Landry) via phone and RN documentation, pt is preparing to discharge, has ambulated around nursing station with RN and does not need to be evaluated by PT at this time.   Madilyn Hook 04/29/2019, 1:25 PM

## 2019-04-29 NOTE — Discharge Summary (Signed)
Physician Discharge Summary  Jeremy Merritt WHQ:[id number] DOB: 12/07/1934 DOA: 04/25/2019  PCP: [facility]  Admit date: 04/25/2019 Discharge date: 04/29/2019  Admitted From: Home Disposition:  Home  Recommendations for Outpatient Follow-up:  1. Follow up with PCP in 1-2 weeks 2. Please obtain BMP/CBC in one week 3. Please follow up with GI, Dr. Norma Fredrickson in 2 weeks 4. Please follow up with vascular surgery, Dr. Wyn Quaker in 2 weeks  Home Health: No   Equipment/Devices: None    Discharge Condition: Stable  CODE STATUS: Full  Diet recommendation: Regular, plus Boost or Ensure supplements 3 times daily between meals  Brief/Interim Summary:  84 y.o.malewith medical history significant ofchronic mesenteric ischemia, hypothyroidism, GERD, hyperlipidemia severe protein calorie malnutrition. He presented to the ED11/18/20due to worsening fatigue and generalized weakness in the setting of 1 week or more of black tarry stools without any associated abdominal pain, nausea or vomiting.Has had prior episodes over past several months, also with 30+ pound weight loss and very poor appetite.Of note regarding his chronic mesenteric ischemia, patient recently had stent placed in the superior mesenteric artery on 04/18/2019 with vascular surgery. Has been on aspirin and Plavix since that time.In the ED, borderline blood pressure 95/61, slightly tachycardic with HR 102.Labs notable for hemoglobin 11.4, from 12.2 on 11/12.Admitted for further evaluation of GI bleeding.  Aspirin and Plavix held.  GI consulted.  Patient underwent EGD and colonoscopy on 11/21.  Colonoscopy showed a large rectal ulcer that was apparent source of bleeding.  EGD showed erosive gastropathy.  Increased PPI to twice daily.  Patient to remain off Plavix 2-4 weeks.  Discussed this with vascular, they agreed with holding Plavix for now, but to resume as soon as it is safe to do so from bleeding standpoint.   Patient is to follow up with GI in 2 weeks, and vascular as well.   Discharge Diagnoses: Principal Problem:   GI bleed Active Problems:   Chronic mesenteric ischemia (HCC)   Essential hypertension   Hyperlipidemia   Protein-calorie malnutrition, severe   Pressure injury of skin  GI bleed secondary to rectal ulcer Hemodynamically stable on admission. Hemoglobin 11.4 -> 9.6.EGD on 11/21 showed erosive gastropathy with no bleeding, nonbleeding gastric ulcers.  Colonoscopy on 11/21 showed severe diverticulosis without signs of diverticular bleeding, and a large rectal ulcer which was a likely source of bleeding.  GI believes this to be secondary to ischemic proctitis given patient's history of vasculopathy vs stercoral ulcer.  Hemoglobin has been relatively stable, mildly diluted from IV fluids. -GI consulted -transition to PO PPI -stop maintenance fluids -resume aspirin -GI recommends holding Plavix 2 to 4 weeks if okay with vascular -SCDs for VT prophylaxis -Resume diet  Anemia, likely due to chronic blood loss Due to GI bleeding that has been ongoing intermittently for several months.  - monitor CBC - transfuse if Hbg <7 - follow up anemia panel  Chronic mesenteric ischemia Status post stent to SMA on 11/11. -Resume aspirin -Hold Plavix, preferably 2 to 4 weeks per GI, will discuss with vascular  Essential hypertension Borderline low blood pressures on admission in setting of GI bleeding. Does not appear to be on antihypertensives at home however.  Hyperlipidemia-continue home simvastatin  Severe protein calorie malnutrition  -ContinueEnsures -Stop Megace due to risk for VTE -Trial of low-dose dronabinol, patient tolerating  Seasonal allergies-continue Zyrtec  GERD-on Prilosec at home, hold while on IV PPI  Hypothyroidism-continue levothyroxine  Pressure injury of skin - present on admission Pressure Injury  04/26/19 Sacrum Mid Stage I - Intact  skin with non-blanchable redness of a localized area usually over a bony prominence. (Active)  04/26/19 1520  Location: Sacrum  Location Orientation: Mid  Staging: Stage I - Intact skin with non-blanchable redness of a localized area usually over a bony prominence.  Wound Description (Comments):   Present on Admission: Yes  - continue to monitor   DVT prophylaxis:SCD's, TED hose Code Status: Full Code Family Communication:none at bedside Disposition Plan:Pending clinical course & clearance by GI    Discharge Instructions   Discharge Instructions    Call MD for:   Complete by: As directed    If persistent bleeding starts again, or you start becoming more weak or fatigued.   Call MD for:  extreme fatigue   Complete by: As directed    Call MD for:  persistant dizziness or light-headedness   Complete by: As directed    Diet - low sodium heart healthy   Complete by: As directed    Discharge instructions   Complete by: As directed    Please see Dr. Wyn Quaker, vascular surgeon for follow-up.  You will stay off Plavix for now, probably 2-4 weeks.    Also see Dr. Norma Fredrickson, gastroenterologist / GI, in two weeks.    Increase your omeprazole to twice daily.   Increase activity slowly   Complete by: As directed      Allergies as of 04/29/2019   No Known Allergies     Medication List    STOP taking these medications   clopidogrel 75 MG tablet Commonly known as: PLAVIX   lisinopril-hydrochlorothiazide 20-12.5 MG tablet Commonly known as: ZESTORETIC   megestrol 40 MG tablet Commonly known as: MEGACE     TAKE these medications   aspirin EC 81 MG tablet Take 81 mg by mouth daily.   cetirizine 10 MG tablet Commonly known as: ZYRTEC Take 0.5-1 tablets (5-10 mg total) by mouth at bedtime. May stop taking daily after 6 weeks.   dronabinol 2.5 MG capsule Commonly known as: MARINOL Take 1 capsule (2.5 mg total) by mouth 2 (two) times daily before lunch and supper.    feeding supplement (ENSURE ENLIVE) Liqd Take 237 mLs by mouth 2 (two) times daily between meals.   levothyroxine 100 MCG tablet Commonly known as: SYNTHROID Take 100 mcg by mouth daily before breakfast.   multivitamin with minerals Tabs tablet Take 1 tablet by mouth daily.   omeprazole 40 MG capsule Commonly known as: PRILOSEC Take 1 capsule (40 mg total) by mouth 2 (two) times daily. What changed: when to take this   simvastatin 20 MG tablet Commonly known as: ZOCOR Take 20 mg by mouth at bedtime.   Tums 500 MG chewable tablet Generic drug: calcium carbonate Chew 500-1,000 mg by mouth daily as needed (acid reduction).   Vitamin D3 50 MCG (2000 UT) Tabs Take 2,000 Units by mouth daily.      Follow-up Information    [place], Boykin Nearing, MD. Schedule an appointment as soon as possible for a visit in 2 week(s).   Specialty: Gastroenterology Contact information: [address] [place] [address] [phone]        Annice Needy, MD. Schedule an appointment as soon as possible for a visit in 2 week(s).   Specialties: Vascular Surgery, Radiology, Interventional Cardiology Contact information: [address] [place] [address] [phone]          No Known Allergies  Consultations:  Gastroenterology  Vascular  Procedures/Studies:  No results found.   EGD and Colonoscopy 11/21   Subjective: Patient seen awake in bed this AM.  Reports feeling well. Says he felt okay ambulating with PT, still somewhat weak but much better than he felt on admission.  Has not seen any more bleeding.  Asks if he can go home today.   Discharge Exam: Vitals:   04/29/19 0504 04/29/19 0852  BP: (!) 112/59 (!) 114/50  Pulse: 85 87  Resp: 16 16  Temp: 98.1 F (36.7 C) 98.9 F (37.2 C)  SpO2: 99% 100%   Vitals:   04/28/19 1519 04/28/19 2036 04/29/19 0504 04/29/19 0852  BP: (!) 124/49 (!) 108/58 (!) 112/59 (!) 114/50  Pulse: 86 96 85 87  Resp: 19  16 16    Temp: 98.3 F (36.8 C) 99.3 F (37.4 C) 98.1 F (36.7 C) 98.9 F (37.2 C)  TempSrc: Oral Oral Oral   SpO2: 100% 97% 99% 100%  Weight:   44.1 kg   Height:        General: Pt is alert, awake, not in acute distress Cardiovascular: RRR, S1/S2 +, no rubs, no gallops Respiratory: CTA bilaterally, no wheezing, no rhonchi Abdominal: Soft, NT, ND, bowel sounds + Extremities: no edema, no cyanosis    The results of significant diagnostics from this hospitalization (including imaging, microbiology, ancillary and laboratory) are listed below for reference.     Microbiology: Recent Results (from the past 240 hour(s))  SARS CORONAVIRUS 2 (TAT 6-24 HRS) Nasopharyngeal Nasopharyngeal Swab     Status: None   Collection Time: 04/25/19  5:07 PM   Specimen: Nasopharyngeal Swab  Result Value Ref Range Status   SARS Coronavirus 2 NEGATIVE NEGATIVE Final    Comment: (NOTE) SARS-CoV-2 target nucleic acids are NOT DETECTED. The SARS-CoV-2 RNA is generally detectable in upper and lower respiratory specimens during the acute phase of infection. Negative results do not preclude SARS-CoV-2 infection, do not rule out co-infections with other pathogens, and should not be used as the sole basis for treatment or other patient management decisions. Negative results must be combined with clinical observations, patient history, and epidemiological information. The expected result is Negative. Fact Sheet for Patients: SugarRoll.be Fact Sheet for Healthcare Providers: https://www.woods-mathews.com/ This test is not yet approved or cleared by the [place] FDA and  has been authorized for detection and/or diagnosis of SARS-CoV-2 by FDA under an Emergency Use Authorization (EUA). This EUA will remain  in effect (meaning this test can be used) for the duration of the COVID-19 declaration under Section 56 4(b)(1) of the Act, 21 U.S.C. section 360bbb-3(b)(1),  unless the authorization is terminated or revoked sooner. Performed at [facility], [place] [address], [place], [place] [address]      Labs: BNP (last 3 results) No results for input(s): BNP in the last 8760 hours. Basic Metabolic Panel: Recent Labs  Lab 04/25/19 1303 04/26/19 0630 04/27/19 0625 04/28/19 0453  NA 138 138 138 139  K 3.7 3.7 4.1 4.5  CL 106 106 105 105  CO2 20* 22 25 25   GLUCOSE 117* 88 94 85  BUN 27* 23 13 11   CREATININE 0.94 0.78 0.73 0.89  CALCIUM 8.1* 8.0* 7.8* 7.8*  MG  --  2.0 1.7 2.4   Liver Function Tests: Recent Labs  Lab 04/25/19 1303 04/26/19 0630  AST 37 26  ALT 29 25  ALKPHOS 87 80  BILITOT 0.5 0.7  PROT 6.0* 5.8*  ALBUMIN 2.8* 2.6*   Recent Labs  Lab 04/25/19 1303  LIPASE 21   No results for input(s): AMMONIA in the last 168 hours. CBC: Recent Labs  Lab 04/25/19 1303 04/26/19 0630 04/27/19 0625 04/28/19 0453 04/29/19 0441  WBC 9.3 6.5 7.2 6.1 6.9  HGB 11.4* 11.0* 10.4* 9.6* 8.8*  HCT 34.1* 31.8* 30.5* 28.9* 26.4*  MCV 90.0 84.6 87.6 88.7 89.5  PLT 383 327 332 323 285   Cardiac Enzymes: No results for input(s): CKTOTAL, CKMB, CKMBINDEX, TROPONINI in the last 168 hours. BNP: Invalid input(s): POCBNP CBG: No results for input(s): GLUCAP in the last 168 hours. D-Dimer No results for input(s): DDIMER in the last 72 hours. Hgb A1c No results for input(s): HGBA1C in the last 72 hours. Lipid Profile No results for input(s): CHOL, HDL, LDLCALC, TRIG, CHOLHDL, LDLDIRECT in the last 72 hours. Thyroid function studies No results for input(s): TSH, T4TOTAL, T3FREE, THYROIDAB in the last 72 hours.  Invalid input(s): FREET3 Anemia work up Recent Labs    04/27/19 1631 04/28/19 0453  VITAMINB12 335  --   FOLATE 6.9  --   FERRITIN  --  151  TIBC 187*  --   IRON 20*  --   RETICCTPCT 2.0  --    Urinalysis    Component Value Date/Time   COLORURINE YELLOW (A) 04/25/2019 1303   APPEARANCEUR HAZY (A) 04/25/2019 1303    LABSPEC 1.017 04/25/2019 1303   PHURINE 5.0 04/25/2019 1303   GLUCOSEU NEGATIVE 04/25/2019 1303   HGBUR NEGATIVE 04/25/2019 1303   BILIRUBINUR NEGATIVE 04/25/2019 1303   BILIRUBINUR negative 06/15/2018 1224   KETONESUR NEGATIVE 04/25/2019 1303   PROTEINUR NEGATIVE 04/25/2019 1303   UROBILINOGEN 0.2 06/15/2018 1224   NITRITE NEGATIVE 04/25/2019 1303   LEUKOCYTESUR SMALL (A) 04/25/2019 1303   Sepsis Labs Invalid input(s): PROCALCITONIN,  WBC,  LACTICIDVEN Microbiology Recent Results (from the past 240 hour(s))  SARS CORONAVIRUS 2 (TAT 6-24 HRS) Nasopharyngeal Nasopharyngeal Swab     Status: None   Collection Time: 04/25/19  5:07 PM   Specimen: Nasopharyngeal Swab  Result Value Ref Range Status   SARS Coronavirus 2 NEGATIVE NEGATIVE Final    Comment: (NOTE) SARS-CoV-2 target nucleic acids are NOT DETECTED. The SARS-CoV-2 RNA is generally detectable in upper and lower respiratory specimens during the acute phase of infection. Negative results do not preclude SARS-CoV-2 infection, do not rule out co-infections with other pathogens, and should not be used as the sole basis for treatment or other patient management decisions. Negative results must be combined with clinical observations, patient history, and epidemiological information. The expected result is Negative. Fact Sheet for Patients: HairSlick.nohttps://www.fda.gov/media/138098/download Fact Sheet for Healthcare Providers: quierodirigir.comhttps://www.fda.gov/media/138095/download This test is not yet approved or cleared by the [place] FDA and  has been authorized for detection and/or diagnosis of SARS-CoV-2 by FDA under an Emergency Use Authorization (EUA). This EUA will remain  in effect (meaning this test can be used) for the duration of the COVID-19 declaration under Section 56 4(b)(1) of the Act, 21 U.S.C. section 360bbb-3(b)(1), unless the authorization is terminated or revoked sooner. Performed at [facility], [address] [place], [place] [address]      Time coordinating discharge: Over 30 minutes  SIGNED:   Pennie BanterKelly A Beva Remund, DO [facility] 04/29/2019, 12:05 PM Pager [phone]  If 7PM-7AM, please contact night-coverage [email or web address] Password [email or web address]

## 2019-04-29 NOTE — Plan of Care (Signed)
  Problem: Education: Goal: Ability to identify signs and symptoms of gastrointestinal bleeding will improve Outcome: Progressing   Problem: Fluid Volume: Goal: Will show no signs and symptoms of excessive bleeding Outcome: Progressing   Problem: Education: Goal: Knowledge of General Education information will improve Description: Including pain rating scale, medication(s)/side effects and non-pharmacologic comfort measures Outcome: Progressing   Problem: Health Behavior/Discharge Planning: Goal: Ability to manage health-related needs will improve Outcome: Progressing   Problem: Activity: Goal: Risk for activity intolerance will decrease Outcome: Progressing   Problem: Safety: Goal: Ability to remain free from injury will improve Outcome: Progressing

## 2019-04-29 NOTE — Progress Notes (Signed)
Arlyss Repress, MD [address]  [address]  [place], [place] [address]  Main: [phone]  Fax: [phone] Pager: [phone]   Subjective: He does not have any major concerns today.  He is ready to go home today.  He denies abdominal pain, nausea vomiting.  He had breakfast and lunch, tolerated it well.  Did not have any bowel movement since colonoscopy yesterday.   Objective: Vital signs in last 24 hours: Vitals:   04/28/19 1519 04/28/19 2036 04/29/19 0504 04/29/19 0852  BP: (!) 124/49 (!) 108/58 (!) 112/59 (!) 114/50  Pulse: 86 96 85 87  Resp: 19  16 16   Temp: 98.3 F (36.8 C) 99.3 F (37.4 C) 98.1 F (36.7 C) 98.9 F (37.2 C)  TempSrc: Oral Oral Oral   SpO2: 100% 97% 99% 100%  Weight:   44.1 kg   Height:       Weight change: 0.998 kg  Intake/Output Summary (Last 24 hours) at 04/29/2019 1619 Last data filed at 04/29/2019 0900 Gross per 24 hour  Intake 600 ml  Output 400 ml  Net 200 ml     Exam: Heart:: Regular rate and rhythm, S1S2 present or without murmur or extra heart sounds Lungs: normal and clear to auscultation Abdomen: soft, nontender, normal bowel sounds   Lab Results: CBC Latest Ref Rng & Units 04/29/2019 04/28/2019 04/27/2019  WBC 4.0 - 10.5 K/uL 6.9 6.1 7.2  Hemoglobin 13.0 - 17.0 g/dL 04/29/2019) 0.9(G) 10.4(L)  Hematocrit 39.0 - 52.0 % 26.4(L) 28.9(L) 30.5(L)  Platelets 150 - 400 K/uL 285 323 332   CMP Latest Ref Rng & Units 04/28/2019 04/27/2019 04/26/2019  Glucose 70 - 99 mg/dL 85 94 88  BUN 8 - 23 mg/dL 11 13 23   Creatinine 0.61 - 1.24 mg/dL 04/28/2019 6.62  Sodium 135 - 145 mmol/L 139 138 138  Potassium 3.5 - 5.1 mmol/L 4.5 4.1 3.7  Chloride 98 - 111 mmol/L 105 105 106  CO2 22 - 32 mmol/L 25 25 22   Calcium 8.9 - 10.3 mg/dL 7.8(L) 7.8(L) 8.0(L)  Total Protein 6.5 - 8.1 g/dL - - 5.8(L)  Total Bilirubin 0.3 - 1.2 mg/dL - - 0.7  Alkaline Phos 38 - 126 U/L - - 80  AST 15 - 41 U/L - - 26  ALT 0 - 44 U/L - - 25    Micro  Results: Recent Results (from the past 240 hour(s))  SARS CORONAVIRUS 2 (TAT 6-24 HRS) Nasopharyngeal Nasopharyngeal Swab     Status: None   Collection Time: 04/25/19  5:07 PM   Specimen: Nasopharyngeal Swab  Result Value Ref Range Status   SARS Coronavirus 2 NEGATIVE NEGATIVE Final    Comment: (NOTE) SARS-CoV-2 target nucleic acids are NOT DETECTED. The SARS-CoV-2 RNA is generally detectable in upper and lower respiratory specimens during the acute phase of infection. Negative results do not preclude SARS-CoV-2 infection, do not rule out co-infections with other pathogens, and should not be used as the sole basis for treatment or other patient management decisions. Negative results must be combined with clinical observations, patient history, and epidemiological information. The expected result is Negative. Fact Sheet for Patients: 6.54 Fact Sheet for Healthcare Providers: This test is not yet approved or cleared by the 04/27/19 FDA and  has been authorized for detection and/or diagnosis of SARS-CoV-2 by FDA under an Emergency Use Authorization (EUA). This EUA will remain  in effect (meaning this test can be used) for the duration of the COVID-19  declaration under Section 56 4(b)(1) of the Act, 21 U.S.C. section 360bbb-3(b)(1), unless the authorization is terminated or revoked sooner. Performed at [facility], [place] [address], [place],  [address]    Studies/Results: No results found. Medications:  I have reviewed the patient's current medications. Prior to Admission:  No medications prior to admission.   Scheduled: . aspirin  81 mg Oral Daily  . cholecalciferol  2,000 Units Oral Daily  . dronabinol  2.5 mg Oral BID AC  . feeding supplement  1 Container Oral TID BM  . levothyroxine  100 mcg Oral QAC breakfast  . loratadine  10 mg Oral Daily  . multivitamin with minerals  1  tablet Oral Daily  . pantoprazole  40 mg Oral Daily  . simvastatin  20 mg Oral QHS  . sodium chloride flush  3 mL Intravenous Q12H   Continuous:  OIZ:[id number] **OR** acetaminophen, bisacodyl, calcium carbonate, magnesium citrate, ondansetron **OR** ondansetron (ZOFRAN) IV, polyethylene glycol, traZODone Anti-infectives (From admission, onward)   None     Scheduled Meds: . aspirin  81 mg Oral Daily  . cholecalciferol  2,000 Units Oral Daily  . dronabinol  2.5 mg Oral BID AC  . feeding supplement  1 Container Oral TID BM  . levothyroxine  100 mcg Oral QAC breakfast  . loratadine  10 mg Oral Daily  . multivitamin with minerals  1 tablet Oral Daily  . pantoprazole  40 mg Oral Daily  . simvastatin  20 mg Oral QHS  . sodium chloride flush  3 mL Intravenous Q12H   Continuous Infusions: PRN Meds:.acetaminophen **OR** acetaminophen, bisacodyl, calcium carbonate, magnesium citrate, ondansetron **OR** ondansetron (ZOFRAN) IV, polyethylene glycol, traZODone   Assessment: Principal Problem:   GI bleed Active Problems:   Chronic mesenteric ischemia (HCC)   Essential hypertension   Hyperlipidemia   Protein-calorie malnutrition, severe   Pressure injury of skin  Acute blood loss anemia secondary to rectal ulcers S/p EGD and colonoscopy on 04/28/2019 which revealed gastric erosions and healing gastric ulcers.  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  Biopsy was performed No further bleeding  Plan: Recommend omeprazole 40 mg 2 times a day Follow-up on H. pylori serologies and treat if positive Okay to continue aspirin 81 mg, please hold Plavix for 2 weeks at least if okay with vascular to allow healing of the rectal ulcer and prevent recurrent bleeding He should follow-up with Dr. Alice Reichert in 2 to 3 weeks as outpatient Okay to discharge home today from GI standpoint Advised patient to have high-protein diet with protein supplements at  least 2-3 times a day   LOS: 4 days   Jeremy Merritt 04/29/2019, 4:19 PM

## 2019-04-30 ENCOUNTER — Encounter: Payer: Self-pay | Admitting: Gastroenterology

## 2019-05-01 ENCOUNTER — Other Ambulatory Visit (INDEPENDENT_AMBULATORY_CARE_PROVIDER_SITE_OTHER): Payer: Self-pay | Admitting: Vascular Surgery

## 2019-05-01 DIAGNOSIS — K551 Chronic vascular disorders of intestine: Secondary | ICD-10-CM

## 2019-05-01 DIAGNOSIS — Z95820 Peripheral vascular angioplasty status with implants and grafts: Secondary | ICD-10-CM

## 2019-05-01 LAB — H PYLORI, IGM, IGG, IGA AB
H Pylori IgG: 0.15 Index Value (ref 0.00–0.79)
H. Pylogi, Iga Abs: 10.6 units — ABNORMAL HIGH (ref 0.0–8.9)
H. Pylogi, Igm Abs: <9 units (ref 0.0–8.9)

## 2019-05-04 ENCOUNTER — Other Ambulatory Visit: Payer: Self-pay

## 2019-05-04 ENCOUNTER — Ambulatory Visit (INDEPENDENT_AMBULATORY_CARE_PROVIDER_SITE_OTHER): Payer: Medicare Other | Admitting: Adult Health Nurse Practitioner

## 2019-05-04 ENCOUNTER — Encounter: Payer: Self-pay | Admitting: Adult Health Nurse Practitioner

## 2019-05-04 VITALS — BP 110/72 | HR 70 | Temp 97.9°F | Ht 67.0 in | Wt 101.4 lb

## 2019-05-04 DIAGNOSIS — R600 Localized edema: Secondary | ICD-10-CM | POA: Diagnosis not present

## 2019-05-04 HISTORY — DX: Localized edema: R60.0

## 2019-05-04 LAB — SURGICAL PATHOLOGY

## 2019-05-04 NOTE — Progress Notes (Signed)
.   Acute Office Visit  Subjective:    Patient ID: Jeremy Merritt, male    DOB: 1934-09-10, 84 y.o.   MRN: [id number]  Chief Complaint  Patient presents with  . Edema    swelling in both feet, says feet are always cold. Says it started after taking megestrol and omeprazole. Pt is located in rm 1    HPI Patient is in today for swelling to bilateral lower extremities.  States it has been going on for 3-4 days.  He is concerned that it is due to the medications he is taking including omeprazole and dronabinol.  He has not been elevating legs.  He is eating okay.   Recently released from the hospital for bleeding rectal ulcer.    Past Medical History:  Diagnosis Date  . Allergy   . Hyperlipidemia   . Hypertension   . Thyroid disease     Past Surgical History:  Procedure Laterality Date  . COLONOSCOPY WITH PROPOFOL N/A 04/28/2019   Procedure: COLONOSCOPY WITH PROPOFOL;  Surgeon: Toney Reil, MD;  Location: [facility] ENDOSCOPY;  Service: Gastroenterology;  Laterality: N/A;  . ESOPHAGOGASTRODUODENOSCOPY (EGD) WITH PROPOFOL N/A 04/28/2019   Procedure: ESOPHAGOGASTRODUODENOSCOPY (EGD) WITH PROPOFOL;  Surgeon: Toney Reil, MD;  Location: [facility] ENDOSCOPY;  Service: Gastroenterology;  Laterality: N/A;  . INGUINAL HERNIA REPAIR    . VISCERAL ANGIOGRAPHY N/A 04/18/2019   Procedure: VISCERAL ANGIOGRAPHY;  Surgeon: Annice Needy, MD;  Location: [facility] INVASIVE CV LAB;  Service: Cardiovascular;  Laterality: N/A;    Family History  Problem Relation Age of Onset  . Other Mother        died of old age  . Other Father        Black lung  . Thyroid cancer Sister   . Kidney failure Brother        on dialysis  . Colon cancer Neg Hx   . Liver cancer Neg Hx   . Stomach cancer Neg Hx     Social History   Socioeconomic History  . Marital status: Widowed    Spouse name: Not on file  . Number of children: 2  . Years of education: Not on file  . Highest education level: Not on  file  Occupational History  . Occupation: retired  Engineer, production  . Financial resource strain: Not on file  . Food insecurity    Worry: Not on file    Inability: Not on file  . Transportation needs    Medical: Not on file    Non-medical: Not on file  Tobacco Use  . Smoking status: Never Smoker  . Smokeless tobacco: Never Used  Substance and Sexual Activity  . Alcohol use: No  . Drug use: No  . Sexual activity: Never  Lifestyle  . Physical activity    Days per week: Not on file    Minutes per session: Not on file  . Stress: Not on file  Relationships  . Social Musician on phone: Not on file    Gets together: Not on file    Attends religious service: Not on file    Active member of club or organization: Not on file    Attends meetings of clubs or organizations: Not on file    Relationship status: Not on file  . Intimate partner violence    Fear of current or ex partner: Not on file    Emotionally abused: Not on file    Physically abused: Not  on file    Forced sexual activity: Not on file  Other Topics Concern  . Not on file  Social History Narrative  . Not on file    Outpatient Medications Prior to Visit  Medication Sig Dispense Refill  . aspirin EC 81 MG tablet Take 81 mg by mouth daily.    . calcium carbonate (TUMS) 500 MG chewable tablet Chew 500-1,000 mg by mouth daily as needed (acid reduction).     . cetirizine (ZYRTEC) 10 MG tablet Take 0.5-1 tablets (5-10 mg total) by mouth at bedtime. May stop taking daily after 6 weeks. 90 tablet 0  . Cholecalciferol (VITAMIN D3) 2000 units TABS Take 2,000 Units by mouth daily.     Marland Kitchen dronabinol (MARINOL) 2.5 MG capsule Take 1 capsule (2.5 mg total) by mouth 2 (two) times daily before lunch and supper. 60 capsule 1  . feeding supplement, ENSURE ENLIVE, (ENSURE ENLIVE) LIQD Take 237 mLs by mouth 2 (two) times daily between meals. 237 mL 12  . levothyroxine (SYNTHROID, LEVOTHROID) 100 MCG tablet Take 100 mcg by mouth  daily before breakfast.    . megestrol (MEGACE) 40 MG/ML suspension Take by mouth daily.    . simvastatin (ZOCOR) 20 MG tablet Take 20 mg by mouth at bedtime.     . Multiple Vitamin (MULTIVITAMIN WITH MINERALS) TABS tablet Take 1 tablet by mouth daily. 90 tablet 3  . omeprazole (PRILOSEC) 40 MG capsule Take 1 capsule (40 mg total) by mouth 2 (two) times daily. (Patient not taking: Reported on 05/04/2019) 60 capsule 2   No facility-administered medications prior to visit.     No Known Allergies  Review of Systems  Constitutional: Negative for chills, fever and malaise/fatigue.  Respiratory: Negative for cough and shortness of breath.   Cardiovascular: Positive for leg swelling. Negative for chest pain and PND.  Gastrointestinal: Negative for blood in stool.  Skin: Negative for itching and rash.  Endo/Heme/Allergies: Does not bruise/bleed easily.          Objective:    Physical Exam  Constitutional: He appears well-developed and well-nourished.  Cardiovascular: Normal rate, regular rhythm and normal heart sounds.  Pulmonary/Chest: Effort normal and breath sounds normal.  Musculoskeletal:        General: Edema (bilateral 2+ pitting edema) present.  Skin: Skin is dry.  Cool to touch but peripheral pulses palpable   Nursing note and vitals reviewed.   BP 110/72   Pulse 70   Temp 97.9 F (36.6 C)   Ht 5\' 7"  (1.702 m)   Wt 101 lb 6.4 oz (46 kg)   SpO2 92%   BMI 15.88 kg/m  Wt Readings from Last 3 Encounters:  05/04/19 101 lb 6.4 oz (46 kg)  04/29/19 97 lb 3.2 oz (44.1 kg)  04/19/19 87 lb 8 oz (39.7 kg)    Health Maintenance Due  Topic Date Due  . TETANUS/TDAP  03/17/1956  . INFLUENZA VACCINE  01/06/2019    There are no preventive care reminders to display for this patient.   Lab Results  Component Value Date   TSH 2.55 07/17/2018   Lab Results  Component Value Date   WBC 6.9 04/29/2019   HGB 8.8 (L) 04/29/2019   HCT 26.4 (L) 04/29/2019   MCV 89.5  04/29/2019   PLT 285 04/29/2019   Lab Results  Component Value Date   NA 139 04/28/2019   K 4.5 04/28/2019   CO2 25 04/28/2019   GLUCOSE 85 04/28/2019   BUN 11 04/28/2019  CREATININE 0.89 04/28/2019   BILITOT 0.7 04/26/2019   ALKPHOS 80 04/26/2019   AST 26 04/26/2019   ALT 25 04/26/2019   PROT 5.8 (L) 04/26/2019   ALBUMIN 2.6 (L) 04/26/2019   CALCIUM 7.8 (L) 04/28/2019   ANIONGAP 9 04/28/2019   GFR 61.60 07/17/2018       Assessment & Plan:   Problem List Items Addressed This Visit    None    Visit Diagnoses    Bilateral lower extremity edema    -  Primary   Relevant Orders   Compression stockings       Would not place patient on diuretic given his low BP.  Compression stockings and elevation of lower extremities at or above level of heart were reviewed with patient.  He verbalized understanding. Should symptoms persist or worsen, he was advised to go to ER. Patient is inline with this plan.    No orders of the defined types were placed in this encounter.    Elyse JarvisSarah A Emanii Bugbee, NP

## 2019-05-07 ENCOUNTER — Ambulatory Visit: Payer: Medicare Other | Admitting: Emergency Medicine

## 2019-05-08 ENCOUNTER — Encounter (INDEPENDENT_AMBULATORY_CARE_PROVIDER_SITE_OTHER): Payer: Medicare Other

## 2019-05-08 ENCOUNTER — Ambulatory Visit (INDEPENDENT_AMBULATORY_CARE_PROVIDER_SITE_OTHER): Payer: Medicare Other | Admitting: Vascular Surgery

## 2019-05-16 ENCOUNTER — Other Ambulatory Visit: Payer: Self-pay

## 2019-05-16 ENCOUNTER — Ambulatory Visit (INDEPENDENT_AMBULATORY_CARE_PROVIDER_SITE_OTHER): Payer: Medicare Other

## 2019-05-16 ENCOUNTER — Ambulatory Visit (INDEPENDENT_AMBULATORY_CARE_PROVIDER_SITE_OTHER): Payer: Medicare Other | Admitting: Nurse Practitioner

## 2019-05-16 ENCOUNTER — Encounter (INDEPENDENT_AMBULATORY_CARE_PROVIDER_SITE_OTHER): Payer: Self-pay | Admitting: Nurse Practitioner

## 2019-05-16 VITALS — BP 158/81 | HR 18 | Resp 18 | Ht 67.0 in | Wt 103.0 lb

## 2019-05-16 DIAGNOSIS — Z95820 Peripheral vascular angioplasty status with implants and grafts: Secondary | ICD-10-CM | POA: Diagnosis not present

## 2019-05-16 DIAGNOSIS — E785 Hyperlipidemia, unspecified: Secondary | ICD-10-CM

## 2019-05-16 DIAGNOSIS — K559 Vascular disorder of intestine, unspecified: Secondary | ICD-10-CM | POA: Diagnosis not present

## 2019-05-16 DIAGNOSIS — K551 Chronic vascular disorders of intestine: Secondary | ICD-10-CM

## 2019-05-16 DIAGNOSIS — K625 Hemorrhage of anus and rectum: Secondary | ICD-10-CM

## 2019-05-20 ENCOUNTER — Encounter (INDEPENDENT_AMBULATORY_CARE_PROVIDER_SITE_OTHER): Payer: Self-pay | Admitting: Nurse Practitioner

## 2019-05-20 NOTE — Progress Notes (Signed)
SUBJECTIVE:  Patient ID: Jeremy Merritt, male    DOB: 01-12-1935, 84 y.o.   MRN: [id number] Chief Complaint  Patient presents with  . Follow-up    ultrasound    HPI  Jeremy Merritt is a 84 y.o. male that presents today after mesenteric angiogram on 04/18/2019.  The patient's postoperative course was complicated due to GI bleed.  Since that time the patient has stopped his Plavix however has continued on 81 mg aspirin.  The patient states that his appetite has greatly increased and he has no postprandial pain.  He also states that his GI bleeding has stopped and has no evidence of blood in his stool.  The patient continues to have weakness however he experienced weakness prior to the angiogram.  He denies any fever, chills, nausea, vomiting or diarrhea.  He denies any chest pain or shortness of breath.  Today the patient has evidence of a normal inferior mesenteric artery, splenic artery and hepatic artery findings.  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  The SMA stent is patent and velocities are within the upper normal limits.  Past Medical History:  Diagnosis Date  . Allergy   . Bilateral lower extremity edema 05/04/2019  . Hyperlipidemia   . Hypertension   . Thyroid disease     Past Surgical History:  Procedure Laterality Date  . COLONOSCOPY WITH PROPOFOL N/A 04/28/2019   Procedure: COLONOSCOPY WITH PROPOFOL;  Surgeon: Toney ReilVanga, Rohini Reddy, MD;  [facility] ENDOSCOPY;  Service: Gastroenterology;  Laterality: N/A;  . ESOPHAGOGASTRODUODENOSCOPY (EGD) WITH PROPOFOL N/A 04/28/2019   Procedure: ESOPHAGOGASTRODUODENOSCOPY (EGD) WITH PROPOFOL;  Surgeon: Toney ReilVanga, Rohini Reddy, MD;  [facility] ENDOSCOPY;  Service: Gastroenterology;  Laterality: N/A;  . INGUINAL HERNIA REPAIR    . VISCERAL ANGIOGRAPHY N/A 04/18/2019   Procedure: VISCERAL ANGIOGRAPHY;  Surgeon: Annice Needyew, Jason S, MD;  Location: [facility] INVASIVE CV LAB;  Service: Cardiovascular;  Laterality: N/A;     Social History   Socioeconomic History  . Marital status: Widowed    Spouse name: Not on file  . Number of children: 2  . Years of education: Not on file  . Highest education level: Not on file  Occupational History  . Occupation: retired  Tobacco Use  . Smoking status: Never Smoker  . Smokeless tobacco: Never Used  Substance and Sexual Activity  . Alcohol use: No  . Drug use: No  . Sexual activity: Never  Other Topics Concern  . Not on file  Social History Narrative  . Not on file   Social Determinants of Health   Financial Resource Strain:   . Difficulty of Paying Living Expenses: Not on file  Food Insecurity:   . Worried About Programme researcher, broadcasting/film/videounning Out of Food in the Last Year: Not on file  . Ran Out of Food in the Last Year: Not on file  Transportation Needs:   . Lack of Transportation (Medical): Not on file  . Lack of Transportation (Non-Medical): Not on file  Physical Activity:   . Days of Exercise per Week: Not on file  . Minutes of Exercise per Session: Not on file  Stress:   . Feeling of Stress : Not on file  Social Connections:   . Frequency of Communication with Friends and Family: Not on file  . Frequency of Social Gatherings with Friends and Family: Not on file  . Attends Religious Services: Not on file  . Active Member of Clubs or Organizations: Not on file  .  Attends Banker Meetings: Not on file  . Marital Status: Not on file  Intimate Partner Violence:   . Fear of Current or Ex-Partner: Not on file  . Emotionally Abused: Not on file  . Physically Abused: Not on file  . Sexually Abused: Not on file    Family History  Problem Relation Age of Onset  . Other Mother        died of old age  . Other Father        Black lung  . Thyroid cancer Sister   . Kidney failure Brother        on dialysis  . Colon cancer Neg Hx   . Liver cancer Neg Hx   . Stomach cancer Neg Hx     No Known Allergies   Review of Systems   Review of Systems:  Negative Unless Checked Constitutional: [] Weight loss  [] Fever  [] Chills Cardiac: [] Chest pain   []  Atrial Fibrillation  [] Palpitations   [] Shortness of breath when laying flat   [] Shortness of breath with exertion. [] Shortness of breath at rest Vascular:  [] Pain in legs with walking   [] Pain in legs with standing [] Pain in legs when laying flat   [] Claudication    [] Pain in feet when laying flat    [] History of DVT   [] Phlebitis   [x] Swelling in legs   [] Varicose veins   [] Non-healing ulcers Pulmonary:   [] Uses home oxygen   [] Productive cough   [] Hemoptysis   [] Wheeze  [] COPD   [] Asthma Neurologic:  [] Dizziness   [] Seizures  [] Blackouts [] History of stroke   [] History of TIA  [] Aphasia   [] Temporary Blindness   [] Weakness or numbness in arm   [] Weakness or numbness in leg Musculoskeletal:   [] Joint swelling   [] Joint pain   [] Low back pain  []  History of Knee Replacement [] Arthritis [] back Surgeries  []  Spinal Stenosis    Hematologic:  [] Easy bruising  [] Easy bleeding   [] Hypercoagulable state   [] Anemic Gastrointestinal:  [] Diarrhea   [] Vomiting  [x] Gastroesophageal reflux/heartburn   [] Difficulty swallowing. [] Abdominal pain Genitourinary:  [] Chronic kidney disease   [] Difficult urination  [] Anuric   [] Blood in urine [] Frequent urination  [] Burning with urination   [] Hematuria Skin:  [] Rashes   [] Ulcers [] Wounds Psychological:  [] History of anxiety   []  History of major depression  []  Memory Difficulties      OBJECTIVE:   Physical Exam  BP (!) 158/81 (BP Location: Right Arm)   Pulse (!) 18   Resp 18   Ht 5\' 7"  (1.702 m)   Wt 103 lb (46.7 kg)   BMI 16.13 kg/m   Gen: WD/WN, NAD, appears frail Head: [place]/AT, No temporalis wasting.  Ear/Nose/Throat: Hearing grossly intact, nares w/o erythema or drainage Eyes: PER, EOMI, sclera nonicteric.  Neck: Supple, no masses.  No JVD.  Pulmonary:  Good air movement, no use of accessory muscles.  Cardiac: RRR Vascular:  Vessel Right Left  Radial  Palpable Palpable   Gastrointestinal: soft, non-distended. No guarding/no peritoneal signs.  Musculoskeletal: M/S 5/5 throughout.  No deformity or atrophy.  Neurologic: Pain and light touch intact in extremities.  Symmetrical.  Speech is fluent. Motor exam as listed above. Psychiatric: Judgment intact, Mood & affect appropriate for pt's clinical situation. Dermatologic: No Venous rashes. No Ulcers Noted.  No changes consistent with cellulitis. Lymph : No Cervical lymphadenopathy, no lichenification or skin changes of chronic lymphedema.       ASSESSMENT AND PLAN:  1. Mesenteric ischemia (  [place]) We will have the patient return in 6 weeks to evaluate abdominal pain as well as if GI bleed is completely resolved.  If so we will have the patient placed back on Plavix after waiting the recommended amount of time from GI.  2. Gastrointestinal hemorrhage associated with anorectal source Currently resolved per patient  3. Hyperlipidemia, unspecified hyperlipidemia type Continue statin as ordered and reviewed, no changes at this time    Current Outpatient Medications on File Prior to Visit  Medication Sig Dispense Refill  . aspirin EC 81 MG tablet Take 81 mg by mouth daily.    . calcium carbonate (TUMS) 500 MG chewable tablet Chew 500-1,000 mg by mouth daily as needed (acid reduction).     . cetirizine (ZYRTEC) 10 MG tablet Take 0.5-1 tablets (5-10 mg total) by mouth at bedtime. May stop taking daily after 6 weeks. 90 tablet 0  . Cholecalciferol (VITAMIN D3) 2000 units TABS Take 2,000 Units by mouth daily.     Marland Kitchen dronabinol (MARINOL) 2.5 MG capsule Take 1 capsule (2.5 mg total) by mouth 2 (two) times daily before lunch and supper. 60 capsule 1  . feeding supplement, ENSURE ENLIVE, (ENSURE ENLIVE) LIQD Take 237 mLs by mouth 2 (two) times daily between meals. 237 mL 12  . levothyroxine (SYNTHROID, LEVOTHROID) 100 MCG tablet Take 100 mcg by mouth daily before breakfast.    . megestrol (MEGACE) 40  MG/ML suspension Take by mouth daily.    Marland Kitchen omeprazole (PRILOSEC) 40 MG capsule Take 1 capsule (40 mg total) by mouth 2 (two) times daily. 60 capsule 2  . simvastatin (ZOCOR) 20 MG tablet Take 20 mg by mouth at bedtime.      No current facility-administered medications on file prior to visit.    There are no Patient Instructions on file for this visit. No follow-ups on file.   Kris Hartmann, NP  This note was completed with Sales executive.  Any errors are purely unintentional.

## 2019-05-22 DIAGNOSIS — K5730 Diverticulosis of large intestine without perforation or abscess without bleeding: Secondary | ICD-10-CM | POA: Insufficient documentation

## 2019-05-22 DIAGNOSIS — E039 Hypothyroidism, unspecified: Secondary | ICD-10-CM | POA: Insufficient documentation

## 2019-05-22 DIAGNOSIS — M7710 Lateral epicondylitis, unspecified elbow: Secondary | ICD-10-CM | POA: Insufficient documentation

## 2019-06-07 ENCOUNTER — Telehealth: Payer: Self-pay | Admitting: Family Medicine

## 2019-06-07 ENCOUNTER — Telehealth: Payer: Self-pay | Admitting: *Deleted

## 2019-06-07 NOTE — Telephone Encounter (Signed)
Schedule awv  

## 2019-06-07 NOTE — Telephone Encounter (Signed)
Patient called because he had a missed call. He wants to call us back to schedule  AWV wasn't ready to do that today .  FR

## 2019-06-11 ENCOUNTER — Ambulatory Visit (INDEPENDENT_AMBULATORY_CARE_PROVIDER_SITE_OTHER): Payer: Medicare PPO | Admitting: Family Medicine

## 2019-06-11 VITALS — BP 158/81 | Ht 67.0 in | Wt 103.0 lb

## 2019-06-11 DIAGNOSIS — Z0000 Encounter for general adult medical examination without abnormal findings: Secondary | ICD-10-CM

## 2019-06-11 NOTE — Progress Notes (Signed)
Presents today for [employer] Visit   Date of last exam: 05/04/2019  Interpreter used for this visit? No  I connected with  Jeremy Merritt on 06/11/19 by a telephone  and verified that I am speaking with the correct person using two identifiers.   I discussed the limitations of evaluation and management by telemedicine. The patient expressed understanding and agreed to proceed.    Patient Care Team: Patient, No Pcp Per as PCP - General (General Practice)   Other items to address today:   Discussed Eye/Dental Discussed immunizations      ADVANCE DIRECTIVES: Discussed:yes On File: no Materials Provided: no  Immunization status:   There is no immunization history on file for this patient.   Health Maintenance Due  Topic Date Due  . INFLUENZA VACCINE  01/06/2019     Functional Status Survey: Is the patient deaf or have difficulty hearing?: No Does the patient have difficulty seeing, even when wearing glasses/contacts?: No Does the patient have difficulty concentrating, remembering, or making decisions?: No Does the patient have difficulty walking or climbing stairs?: No Does the patient have difficulty dressing or bathing?: No Does the patient have difficulty doing errands alone such as visiting a doctor's office or shopping?: No   6CIT Screen 06/11/2019  What Year? 0 points  What month? 0 points  What time? 0 points  Count back from 20 0 points  Months in reverse 0 points  Repeat phrase 8 points  Total Score 8        Clinical Support from 06/11/2019 in [facility]  AUDIT-C Score  0       Home Environment:    Lives in one story home No trouble climbing stairs No grab bars Adequate lighting/no clutter  Timed warm up N/A   Patient Active Problem List   Diagnosis Date Noted  . Bilateral lower extremity edema 05/04/2019  . Pressure injury of skin 04/27/2019  . GI bleed 04/25/2019  . Protein-calorie  malnutrition, severe 04/18/2019  . Essential hypertension 04/17/2019  . Hyperlipidemia 04/17/2019  . Loss of weight 04/17/2019  . Mesenteric ischemia ([place]) 04/17/2019  . Chronic mesenteric ischemia ([place]) 10/17/2018  . History of diverticulitis 10/17/2018  . Chronic abdominal pain 10/17/2018  . Abdominal pain, LLQ 09/21/2018  . Diverticulitis large intestine w/o perforation or abscess w/bleeding 09/21/2018  . Weight loss, non-intentional 09/21/2018  . Upper abdominal pain 06/15/2018  . Early satiety 06/15/2018  . Abdominal fullness 06/15/2018  . Constipation 06/15/2018     Past Medical History:  Diagnosis Date  . Allergy   . Bilateral lower extremity edema 05/04/2019  . Hyperlipidemia   . Hypertension   . Thyroid disease      Past Surgical History:  Procedure Laterality Date  . COLONOSCOPY WITH PROPOFOL N/A 04/28/2019   Procedure: COLONOSCOPY WITH PROPOFOL;  Surgeon: Lin Landsman, MD;  Location[facility] ENDOSCOPY;  Service: Gastroenterology;  Laterality: N/A;  . ESOPHAGOGASTRODUODENOSCOPY (EGD) WITH PROPOFOL N/A 04/28/2019   Procedure: ESOPHAGOGASTRODUODENOSCOPY (EGD) WITH PROPOFOL;  Surgeon: Lin Landsman, MD;  Location: [facility] ENDOSCOPY;  Service: Gastroenterology;  Laterality: N/A;  . INGUINAL HERNIA REPAIR    . VISCERAL ANGIOGRAPHY N/A 04/18/2019   Procedure: VISCERAL ANGIOGRAPHY;  Surgeon: Algernon Huxley, MD;  Location: [facility] CV LAB;  Service: Cardiovascular;  Laterality: N/A;     Family History  Problem Relation Age of Onset  . Other Mother        died of old  age  . Other Father        Black lung  . Thyroid cancer Sister   . Kidney failure Brother        on dialysis  . Colon cancer Neg Hx   . Liver cancer Neg Hx   . Stomach cancer Neg Hx      Social History   Socioeconomic History  . Marital status: Widowed    Spouse name: Not on file  . Number of children: 2  . Years of education: Not on file  . Highest education level: Not on file   Occupational History  . Occupation: retired  Tobacco Use  . Smoking status: Never Smoker  . Smokeless tobacco: Never Used  Substance and Sexual Activity  . Alcohol use: No  . Drug use: No  . Sexual activity: Never  Other Topics Concern  . Not on file  Social History Narrative  . Not on file   Social Determinants of Health   Financial Resource Strain:   . Difficulty of Paying Living Expenses: Not on file  Food Insecurity:   . Worried About Programme researcher, broadcasting/film/video in the Last Year: Not on file  . Ran Out of Food in the Last Year: Not on file  Transportation Needs:   . Lack of Transportation (Medical): Not on file  . Lack of Transportation (Non-Medical): Not on file  Physical Activity:   . Days of Exercise per Week: Not on file  . Minutes of Exercise per Session: Not on file  Stress:   . Feeling of Stress : Not on file  Social Connections:   . Frequency of Communication with Friends and Family: Not on file  . Frequency of Social Gatherings with Friends and Family: Not on file  . Attends Religious Services: Not on file  . Active Member of Clubs or Organizations: Not on file  . Attends Banker Meetings: Not on file  . Marital Status: Not on file  Intimate Partner Violence:   . Fear of Current or Ex-Partner: Not on file  . Emotionally Abused: Not on file  . Physically Abused: Not on file  . Sexually Abused: Not on file     No Known Allergies   Prior to Admission medications   Medication Sig Start Date End Date Taking? Authorizing Provider  aspirin EC 81 MG tablet Take 81 mg by mouth daily.   Yes [provider]  calcium carbonate (TUMS) 500 MG chewable tablet Chew 500-1,000 mg by mouth daily as needed (acid reduction).    Yes [provider]  cetirizine (ZYRTEC) 10 MG tablet Take 0.5-1 tablets (5-10 mg total) by mouth at bedtime. May stop taking daily after 6 weeks. 10/14/16  Yes Benjiman Core D, PA-C  Cholecalciferol (VITAMIN D3) 2000  units TABS Take 2,000 Units by mouth daily.    Yes [provider]  feeding supplement, ENSURE ENLIVE, (ENSURE ENLIVE) LIQD Take 237 mLs by mouth 2 (two) times daily between meals. 04/19/19  Yes Stegmayer, Ranae Plumber, PA-C  levothyroxine (SYNTHROID, LEVOTHROID) 100 MCG tablet Take 100 mcg by mouth daily before breakfast.   Yes [provider]  megestrol (MEGACE) 40 MG/ML suspension Take by mouth daily.   Yes [provider]  omeprazole (PRILOSEC) 40 MG capsule Take 1 capsule (40 mg total) by mouth 2 (two) times daily. 04/29/19  Yes Esaw Grandchild A, DO  simvastatin (ZOCOR) 20 MG tablet Take 20 mg by mouth at bedtime.    Yes [provider]  dronabinol (MARINOL) 2.5 MG capsule Take 1 capsule (2.5 mg total) by mouth 2 (two) times daily before lunch and supper. Patient not taking: Reported on 06/11/2019 04/29/19   Esaw Grandchild A, DO     Depression screen [facility] 2/9 06/11/2019 05/04/2019 10/17/2018 06/15/2018 03/15/2017  Decreased Interest 0 0 0 0 0  Down, Depressed, Hopeless 0 0 0 0 0  PHQ - 2 Score 0 0 0 0 0     Fall Risk  06/11/2019 05/04/2019 10/17/2018 06/15/2018 03/15/2017  Falls in the past year? 0 0 0 0 No  Number falls in past yr: 0 0 - - -  Injury with Fall? 0 0 - - -  Risk for fall due to : History of fall(s) - - - -  Follow up Falls evaluation completed;Education provided - Falls evaluation completed - -      PHYSICAL EXAM: BP (!) 158/81 Comment: taken from previous visit  Ht 5\' 7"  (1.702 m)   Wt 103 lb (46.7 kg)   BMI 16.13 kg/m    Wt Readings from Last 3 Encounters:  06/11/19 103 lb (46.7 kg)  05/16/19 103 lb (46.7 kg)  05/04/19 101 lb 6.4 oz (46 kg)    Medicare annual wellness visit, subsequent    Education/Counseling provided regarding diet and exercise, prevention of chronic diseases, smoking/tobacco cessation, if applicable, and reviewed "Covered Medicare Preventive Services."

## 2019-06-11 NOTE — Patient Instructions (Signed)
Thank you for taking time to come for your Medicare Wellness Visit. I appreciate your ongoing commitment to your health goals. Please review the following plan we discussed and let me know if I can assist you in the future.  Kellyann Ordway LPN  Preventive Care 65 Years and Older, Male Preventive care refers to lifestyle choices and visits with your health care provider that can promote health and wellness. This includes:  A yearly physical exam. This is also called an annual well check.  Regular dental and eye exams.  Immunizations.  Screening for certain conditions.  Healthy lifestyle choices, such as diet and exercise. What can I expect for my preventive care visit? Physical exam Your health care provider will check:  Height and weight. These may be used to calculate body mass index (BMI), which is a measurement that tells if you are at a healthy weight.  Heart rate and blood pressure.  Your skin for abnormal spots. Counseling Your health care provider may ask you questions about:  Alcohol, tobacco, and drug use.  Emotional well-being.  Home and relationship well-being.  Sexual activity.  Eating habits.  History of falls.  Memory and ability to understand (cognition).  Work and work environment. What immunizations do I need?  Influenza (flu) vaccine  This is recommended every year. Tetanus, diphtheria, and pertussis (Tdap) vaccine  You may need a Td booster every 10 years. Varicella (chickenpox) vaccine  You may need this vaccine if you have not already been vaccinated. Zoster (shingles) vaccine  You may need this after age 60. Pneumococcal conjugate (PCV13) vaccine  One dose is recommended after age 83. Pneumococcal polysaccharide (PPSV23) vaccine  One dose is recommended after age 83. Measles, mumps, and rubella (MMR) vaccine  You may need at least one dose of MMR if you were born in 1957 or later. You may also need a second dose. Meningococcal  conjugate (MenACWY) vaccine  You may need this if you have certain conditions. Hepatitis A vaccine  You may need this if you have certain conditions or if you travel or work in places where you may be exposed to hepatitis A. Hepatitis B vaccine  You may need this if you have certain conditions or if you travel or work in places where you may be exposed to hepatitis B. Haemophilus influenzae type b (Hib) vaccine  You may need this if you have certain conditions. You may receive vaccines as individual doses or as more than one vaccine together in one shot (combination vaccines). Talk with your health care provider about the risks and benefits of combination vaccines. What tests do I need? Blood tests  Lipid and cholesterol levels. These may be checked every 5 years, or more frequently depending on your overall health.  Hepatitis C test.  Hepatitis B test. Screening  Lung cancer screening. You may have this screening every year starting at age 55 if you have a 30-pack-year history of smoking and currently smoke or have quit within the past 15 years.  Colorectal cancer screening. All adults should have this screening starting at age 50 and continuing until age 75. Your health care provider may recommend screening at age 45 if you are at increased risk. You will have tests every 1-10 years, depending on your results and the type of screening test.  Prostate cancer screening. Recommendations will vary depending on your family history and other risks.  Diabetes screening. This is done by checking your blood sugar (glucose) after you have not eaten for   a while (fasting). You may have this done every 1-3 years.  Abdominal aortic aneurysm (AAA) screening. You may need this if you are a current or former smoker.  Sexually transmitted disease (STD) testing. Follow these instructions at home: Eating and drinking  Eat a diet that includes fresh fruits and vegetables, whole grains, lean  protein, and low-fat dairy products. Limit your intake of foods with high amounts of sugar, saturated fats, and salt.  Take vitamin and mineral supplements as recommended by your health care provider.  Do not drink alcohol if your health care provider tells you not to drink.  If you drink alcohol: ? Limit how much you have to 0-2 drinks a day. ? Be aware of how much alcohol is in your drink. In the [place] one drink equals one 12 oz bottle of beer (355 mL), one 5 oz glass of wine (148 mL), or one 1 oz glass of hard liquor (44 mL). Lifestyle  Take daily care of your teeth and gums.  Stay active. Exercise for at least 30 minutes on 5 or more days each week.  Do not use any products that contain nicotine or tobacco, such as cigarettes, e-cigarettes, and chewing tobacco. If you need help quitting, ask your health care provider.  If you are sexually active, practice safe sex. Use a condom or other form of protection to prevent STIs (sexually transmitted infections).  Talk with your health care provider about taking a low-dose aspirin or statin. What's next?  Visit your health care provider once a year for a well check visit.  Ask your health care provider how often you should have your eyes and teeth checked.  Stay up to date on all vaccines. This information is not intended to replace advice given to you by your health care provider. Make sure you discuss any questions you have with your health care provider. Document Revised: 05/18/2018 Document Reviewed: 05/18/2018 [employer] Patient Education  2020 [employer]  

## 2019-07-01 ENCOUNTER — Other Ambulatory Visit (INDEPENDENT_AMBULATORY_CARE_PROVIDER_SITE_OTHER): Payer: Self-pay | Admitting: Vascular Surgery

## 2019-07-03 ENCOUNTER — Ambulatory Visit (INDEPENDENT_AMBULATORY_CARE_PROVIDER_SITE_OTHER): Payer: Medicare Other | Admitting: Vascular Surgery

## 2019-07-10 ENCOUNTER — Ambulatory Visit (INDEPENDENT_AMBULATORY_CARE_PROVIDER_SITE_OTHER): Payer: Medicare PPO | Admitting: Vascular Surgery

## 2019-07-10 ENCOUNTER — Encounter (INDEPENDENT_AMBULATORY_CARE_PROVIDER_SITE_OTHER): Payer: Self-pay | Admitting: Vascular Surgery

## 2019-07-10 ENCOUNTER — Other Ambulatory Visit: Payer: Self-pay

## 2019-07-10 VITALS — BP 127/79 | HR 102 | Resp 17 | Ht 65.0 in | Wt 106.0 lb

## 2019-07-10 DIAGNOSIS — R634 Abnormal weight loss: Secondary | ICD-10-CM

## 2019-07-10 DIAGNOSIS — I10 Essential (primary) hypertension: Secondary | ICD-10-CM

## 2019-07-10 DIAGNOSIS — E785 Hyperlipidemia, unspecified: Secondary | ICD-10-CM | POA: Diagnosis not present

## 2019-07-10 DIAGNOSIS — K551 Chronic vascular disorders of intestine: Secondary | ICD-10-CM | POA: Diagnosis not present

## 2019-07-10 NOTE — Progress Notes (Signed)
MRN : [id number]  Jeremy Merritt is a 85 y.o. (Nov 03, 1934) male who presents with chief complaint of  Chief Complaint  Patient presents with  . Follow-up  .  History of Present Illness: Patient returns today in follow up of his mesenteric ischemia.  Since his last visit, he has had no further bleeding or other problems.  He is eating better and his belly pain is much improved.  He has gained 3 more pounds although he remains quite slight still.  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  Current Outpatient Medications  Medication Sig Dispense Refill  . aspirin EC 81 MG tablet Take 81 mg by mouth daily.    . calcium carbonate (TUMS) 500 MG chewable tablet Chew 500-1,000 mg by mouth daily as needed (acid reduction).     . cetirizine (ZYRTEC) 10 MG tablet Take 0.5-1 tablets (5-10 mg total) by mouth at bedtime. May stop taking daily after 6 weeks. 90 tablet 0  . Cholecalciferol (VITAMIN D3) 2000 units TABS Take 2,000 Units by mouth daily.     . clopidogrel (PLAVIX) 75 MG tablet Take by mouth.    . dronabinol (MARINOL) 2.5 MG capsule Take 1 capsule (2.5 mg total) by mouth 2 (two) times daily before lunch and supper. 60 capsule 1  . feeding supplement, ENSURE ENLIVE, (ENSURE ENLIVE) LIQD Take 237 mLs by mouth 2 (two) times daily between meals. 237 mL 12  . levothyroxine (SYNTHROID, LEVOTHROID) 100 MCG tablet Take 100 mcg by mouth daily before breakfast.    . megestrol (MEGACE) 40 MG/ML suspension Take by mouth daily.    Marland Kitchen omeprazole (PRILOSEC) 40 MG capsule Take 1 capsule (40 mg total) by mouth 2 (two) times daily. 60 capsule 2  . polyethylene glycol powder (GLYCOLAX/MIRALAX) 17 GM/SCOOP powder Take by mouth.    . simvastatin (ZOCOR) 20 MG tablet Take 20 mg by mouth at bedtime.      No current facility-administered medications for this visit.    Past Medical History:  Diagnosis Date  . Allergy   . Bilateral lower extremity  edema 05/04/2019  . Hyperlipidemia   . Hypertension   . Thyroid disease     Past Surgical History:  Procedure Laterality Date  . COLONOSCOPY WITH PROPOFOL N/A 04/28/2019   Procedure: COLONOSCOPY WITH PROPOFOL;  Surgeon: Toney Reil, MD;  Location: [facility] ENDOSCOPY;  Service: Gastroenterology;  Laterality: N/A;  . ESOPHAGOGASTRODUODENOSCOPY (EGD) WITH PROPOFOL N/A 04/28/2019   Procedure: ESOPHAGOGASTRODUODENOSCOPY (EGD) WITH PROPOFOL;  Surgeon: Toney Reil, MD;  Location: [facility] ENDOSCOPY;  Service: Gastroenterology;  Laterality: N/A;  . INGUINAL HERNIA REPAIR    . VISCERAL ANGIOGRAPHY N/A 04/18/2019   Procedure: VISCERAL ANGIOGRAPHY;  Surgeon: Annice Needy, MD;  Location: [facility] INVASIVE CV LAB;  Service: Cardiovascular;  Laterality: N/A;     Social History   Tobacco Use  . Smoking status: Never Smoker  . Smokeless tobacco: Never Used  Substance Use Topics  . Alcohol use: No  . Drug use: No    Family History  Problem Relation Age of Onset  . Other Mother        died of old age  . Other Father        Black lung  . Thyroid cancer Sister   . Kidney failure Brother        on dialysis  . Colon cancer Neg Hx   . Liver cancer Neg Hx   .  Stomach cancer Neg Hx     No Known Allergies   REVIEW OF SYSTEMS (Negative unless checked)  Constitutional: [x] ?Weight loss  [] ?Fever  [] ?Chills Cardiac: [] ?Chest pain   [] ?Chest pressure   [] ?Palpitations   [] ?Shortness of breath when laying flat   [] ?Shortness of breath at rest   [] ?Shortness of breath with exertion. Vascular:  [] ?Pain in legs with walking   [] ?Pain in legs at rest   [] ?Pain in legs when laying flat   [] ?Claudication   [] ?Pain in feet when walking  [] ?Pain in feet at rest  [] ?Pain in feet when laying flat   [] ?History of DVT   [] ?Phlebitis   [] ?Swelling in legs   [] ?Varicose veins   [] ?Non-healing ulcers Pulmonary:   [] ?Uses home oxygen   [] ?Productive cough   [] ?Hemoptysis   [] ?Wheeze  [] ?COPD    [] ?Asthma Neurologic:  [] ?Dizziness  [] ?Blackouts   [] ?Seizures   [] ?History of stroke   [] ?History of TIA  [] ?Aphasia   [] ?Temporary blindness   [] ?Dysphagia   [] ?Weakness or numbness in arms   [] ?Weakness or numbness in legs Musculoskeletal:  [x] ?Arthritis   [] ?Joint swelling   [x] ?Joint pain   [] ?Low back pain Hematologic:  [] ?Easy bruising  [] ?Easy bleeding   [] ?Hypercoagulable state   [] ?Anemic  [] ?Hepatitis Gastrointestinal:  [x] ?Blood in stool   [] ?Vomiting blood  [x] ?Gastroesophageal reflux/heartburn   [x] ?Abdominal pain Genitourinary:  [] ?Chronic kidney disease   [] ?Difficult urination  [] ?Frequent urination  [] ?Burning with urination   [] ?Hematuria Skin:  [] ?Rashes   [] ?Ulcers   [] ?Wounds Psychological:  [] ?History of anxiety   [] ? History of major depression.  Physical Examination  BP 127/79 (BP Location: Right Arm)   Pulse (!) 102   Resp 17   Ht 5\' 5"  (1.651 m)   Wt 106 lb (48.1 kg)   BMI 17.64 kg/m  Gen: Thin, NAD Head: [place]/AT, No temporalis wasting. Ear/Nose/Throat: Hearing grossly intact, nares w/o erythema or drainage Eyes: Conjunctiva clear. Sclera non-icteric Neck: Supple.  Trachea midline Pulmonary:  Good air movement, no use of accessory muscles.  Cardiac: RRR, no JVD Vascular:  Vessel Right Left  Radial Palpable Palpable                                   Gastrointestinal: soft, non-tender/non-distended. No guarding/reflex.  Musculoskeletal: M/S 5/5 throughout.  No deformity or atrophy.  No edema. Neurologic: Sensation grossly intact in extremities.  Symmetrical.  Speech is fluent.  Psychiatric: Judgment intact, Mood & affect appropriate for pt's clinical situation. Dermatologic: No rashes or ulcers noted.  No cellulitis or open wounds.       Labs Recent Results (from the past 2160 hour(s))  SARS CORONAVIRUS 2 (TAT 6-24 HRS) Nasopharyngeal Nasopharyngeal Swab     Status: None   Collection Time: 04/17/19  4:08 PM   Specimen: Nasopharyngeal Swab   Result Value Ref Range   SARS Coronavirus 2 NEGATIVE NEGATIVE    Comment: (NOTE) SARS-CoV-2 target nucleic acids are NOT DETECTED. The SARS-CoV-2 RNA is generally detectable in upper and lower respiratory specimens during the acute phase of infection. Negative results do not preclude SARS-CoV-2 infection, do not rule out co-infections with other pathogens, and should not be used as the sole basis for treatment or other patient management decisions. Negative results must be combined with clinical observations, patient history, and epidemiological information. The expected result is Negative. Fact Sheet for Patients: HairSlick.nohttps://www.fda.gov/media/138098/download Fact Sheet for Healthcare Providers: quierodirigir.comhttps://www.fda.gov/media/138095/download  This test is not yet approved or cleared by the [place] and  has been authorized for detection and/or diagnosis of SARS-CoV-2 by FDA under an Emergency Use Authorization (EUA). This EUA will remain  in effect (meaning this test can be used) for the duration of the COVID-19 declaration under Section 56 4(b)(1) of the Act, 21 U.S.C. section 360bbb-3(b)(1), unless the authorization is terminated or revoked sooner. Performed at [facility], [place] [address], [place], [place] [address]   Comprehensive metabolic panel     Status: Abnormal   Collection Time: 04/17/19  4:34 PM  Result Value Ref Range   Sodium 142 135 - 145 mmol/L   Potassium 4.3 3.5 - 5.1 mmol/L   Chloride 105 98 - 111 mmol/L   CO2 26 22 - 32 mmol/L   Glucose, Bld 124 (H) 70 - 99 mg/dL   BUN 30 (H) 8 - 23 mg/dL   Creatinine, Ser 1.33 (H) 0.61 - 1.24 mg/dL   Calcium 8.8 (L) 8.9 - 10.3 mg/dL   Total Protein 6.8 6.5 - 8.1 g/dL   Albumin 3.2 (L) 3.5 - 5.0 g/dL   AST 16 15 - 41 U/L   ALT 15 0 - 44 U/L   Alkaline Phosphatase 106 38 - 126 U/L   Total Bilirubin 0.9 0.3 - 1.2 mg/dL   GFR calc non Af Amer 49 (L) >60 mL/min   GFR calc Af Amer 57 (L) >60 mL/min   Anion gap 11 5  - 15    Comment: Performed at [facility], [address] [place], [place] [address]  Magnesium     Status: None   Collection Time: 04/17/19  4:34 PM  Result Value Ref Range   Magnesium 2.4 1.7 - 2.4 mg/dL    Comment: Performed at [facility], [address] [place], [place] [address]  Type and screen     Status: None   Collection Time: 04/17/19  4:34 PM  Result Value Ref Range   ABO/RH(D) O POS    Antibody Screen NEG    Sample Expiration      04/20/2019,2359 Performed at [facility], [address] [place], [place] [address]   APTT     Status: None   Collection Time: 04/17/19  4:34 PM  Result Value Ref Range   aPTT 32 24 - 36 seconds    Comment: Performed at [facility], [address] [place], [place] [address]  CBC WITH DIFFERENTIAL     Status: Abnormal   Collection Time: 04/17/19  4:34 PM  Result Value Ref Range   WBC 12.5 (H) 4.0 - 10.5 K/uL   RBC 4.31 4.22 - 5.81 MIL/uL   Hemoglobin 12.9 (L) 13.0 - 17.0 g/dL   HCT 37.5 (L) 39.0 - 52.0 %   MCV 87.0 80.0 - 100.0 fL   MCH 29.9 26.0 - 34.0 pg   MCHC 34.4 30.0 - 36.0 g/dL   RDW 13.2 11.5 - 15.5 %   Platelets 385 150 - 400 K/uL   nRBC 0.0 0.0 - 0.2 %   Neutrophils Relative % 83 %   Neutro Abs 10.5 (H) 1.7 - 7.7 K/uL   Lymphocytes Relative 9 %   Lymphs Abs 1.1 0.7 - 4.0 K/uL   Monocytes Relative 7 %   Monocytes Absolute 0.8 0.1 - 1.0 K/uL   Eosinophils Relative 0 %   Eosinophils Absolute 0.0 0.0 - 0.5 K/uL   Basophils Relative 0 %  Basophils Absolute 0.0 0.0 - 0.1 K/uL   Immature Granulocytes 1 %   Abs Immature Granulocytes 0.09 (H) 0.00 - 0.07 K/uL    Comment: Performed at [facility], [address] [place], [place] [address]  Protime-INR     Status: None   Collection Time: 04/17/19  4:34 PM  Result Value Ref Range   Prothrombin Time 14.8 11.4 - 15.2 seconds   INR 1.2 0.8 - 1.2    Comment: (NOTE) INR goal varies based on device and disease  states. Performed at [facility], [address] [place], [place] [address]   Basic metabolic panel     Status: Abnormal   Collection Time: 04/18/19  2:35 AM  Result Value Ref Range   Sodium 141 135 - 145 mmol/L   Potassium 3.9 3.5 - 5.1 mmol/L   Chloride 106 98 - 111 mmol/L   CO2 24 22 - 32 mmol/L   Glucose, Bld 91 70 - 99 mg/dL   BUN 29 (H) 8 - 23 mg/dL   Creatinine, Ser 0.98 0.61 - 1.24 mg/dL   Calcium 8.3 (L) 8.9 - 10.3 mg/dL   GFR calc non Af Amer >60 >60 mL/min   GFR calc Af Amer >60 >60 mL/min   Anion gap 11 5 - 15    Comment: Performed at [facility], [address] [place], [place] [address]  CBC     Status: Abnormal   Collection Time: 04/18/19  2:35 AM  Result Value Ref Range   WBC 8.5 4.0 - 10.5 K/uL   RBC 4.04 (L) 4.22 - 5.81 MIL/uL   Hemoglobin 12.0 (L) 13.0 - 17.0 g/dL   HCT 78.2 (L) 95.6 - 21.3 %   MCV 87.6 80.0 - 100.0 fL   MCH 29.7 26.0 - 34.0 pg   MCHC 33.9 30.0 - 36.0 g/dL   RDW 08.6 57.8 - 46.9 %   Platelets 319 150 - 400 K/uL   nRBC 0.0 0.0 - 0.2 %    Comment: Performed at [facility], [address] [place], [place] [address]  Heparin level (unfractionated)     Status: None   Collection Time: 04/18/19  2:35 AM  Result Value Ref Range   Heparin Unfractionated 0.66 0.30 - 0.70 IU/mL    Comment: (NOTE) If heparin results are below expected values, and patient dosage has  been confirmed, suggest follow up testing of antithrombin III levels. Performed at [facility], [address] [place], [place] [address]   Heparin level (unfractionated)     Status: Abnormal   Collection Time: 04/18/19  3:12 PM  Result Value Ref Range   Heparin Unfractionated 0.98 (H) 0.30 - 0.70 IU/mL    Comment: (NOTE) If heparin results are below expected values, and patient dosage has  been confirmed, suggest follow up testing of antithrombin III levels. Performed at [facility], [address] [place], [place]  [address]   CBC     Status: Abnormal   Collection Time: 04/18/19  6:11 PM  Result Value Ref Range   WBC 9.4 4.0 - 10.5 K/uL   RBC 4.42 4.22 - 5.81 MIL/uL   Hemoglobin 13.0 13.0 - 17.0 g/dL   HCT 27.2 (L) 53.6 - 64.4 %   MCV 86.9 80.0 - 100.0 fL   MCH 29.4 26.0 - 34.0 pg   MCHC 33.9 30.0 - 36.0 g/dL   RDW 03.4 74.2 - 59.5 %   Platelets 318 150 - 400 K/uL  nRBC 0.0 0.0 - 0.2 %    Comment: Performed at [facility], [address] [place], [place] [address]  Magnesium     Status: None   Collection Time: 04/19/19  3:37 AM  Result Value Ref Range   Magnesium 2.1 1.7 - 2.4 mg/dL    Comment: Performed at [facility], [address] [place], [place] [address]  Basic metabolic panel     Status: Abnormal   Collection Time: 04/19/19  3:37 AM  Result Value Ref Range   Sodium 139 135 - 145 mmol/L   Potassium 4.3 3.5 - 5.1 mmol/L   Chloride 109 98 - 111 mmol/L   CO2 25 22 - 32 mmol/L   Glucose, Bld 100 (H) 70 - 99 mg/dL   BUN 22 8 - 23 mg/dL   Creatinine, Ser 0.98 0.61 - 1.24 mg/dL   Calcium 7.8 (L) 8.9 - 10.3 mg/dL   GFR calc non Af Amer >60 >60 mL/min   GFR calc Af Amer >60 >60 mL/min   Anion gap 5 5 - 15    Comment: Performed at [facility], [address] [place], [place] [address]  CBC     Status: Abnormal   Collection Time: 04/19/19  3:37 AM  Result Value Ref Range   WBC 8.7 4.0 - 10.5 K/uL   RBC 3.60 (L) 4.22 - 5.81 MIL/uL   Hemoglobin 10.6 (L) 13.0 - 17.0 g/dL   HCT 78.2 (L) 95.6 - 21.3 %   MCV 88.1 80.0 - 100.0 fL   MCH 29.4 26.0 - 34.0 pg   MCHC 33.4 30.0 - 36.0 g/dL   RDW 08.6 57.8 - 46.9 %   Platelets 291 150 - 400 K/uL   nRBC 0.0 0.0 - 0.2 %    Comment: Performed at [facility], [address] [place], [place] [address]  Phosphorus     Status: Abnormal   Collection Time: 04/19/19  3:37 AM  Result Value Ref Range   Phosphorus 2.2 (L) 2.5 - 4.6 mg/dL    Comment: Performed at [facility], [address]  [place], [place] [address]  CBC     Status: Abnormal   Collection Time: 04/19/19 11:11 AM  Result Value Ref Range   WBC 10.2 4.0 - 10.5 K/uL   RBC 4.10 (L) 4.22 - 5.81 MIL/uL   Hemoglobin 12.2 (L) 13.0 - 17.0 g/dL   HCT 44.0 (L) 10.2 - 72.5 %   MCV 85.9 80.0 - 100.0 fL   MCH 29.8 26.0 - 34.0 pg   MCHC 34.7 30.0 - 36.0 g/dL   RDW 36.6 44.0 - 34.7 %   Platelets 303 150 - 400 K/uL   nRBC 0.0 0.0 - 0.2 %    Comment: Performed at [facility], [address] [place], [place] [address]  Lipase, blood     Status: None   Collection Time: 04/25/19  1:03 PM  Result Value Ref Range   Lipase 21 11 - 51 U/L    Comment: Performed at [facility], [address] [place], [place] [address]  Comprehensive metabolic panel     Status: Abnormal   Collection Time: 04/25/19  1:03 PM  Result Value Ref Range   Sodium 138 135 - 145 mmol/L   Potassium 3.7 3.5 - 5.1 mmol/L   Chloride 106 98 - 111 mmol/L   CO2 20 (L) 22 - 32 mmol/L   Glucose, Bld 117 (H) 70 - 99 mg/dL  BUN 27 (H) 8 - 23 mg/dL   Creatinine, Ser 7.79 0.61 - 1.24 mg/dL   Calcium 8.1 (L) 8.9 - 10.3 mg/dL   Total Protein 6.0 (L) 6.5 - 8.1 g/dL   Albumin 2.8 (L) 3.5 - 5.0 g/dL   AST 37 15 - 41 U/L   ALT 29 0 - 44 U/L   Alkaline Phosphatase 87 38 - 126 U/L   Total Bilirubin 0.5 0.3 - 1.2 mg/dL   GFR calc non Af Amer >60 >60 mL/min   GFR calc Af Amer >60 >60 mL/min   Anion gap 12 5 - 15    Comment: Performed at [facility], [address] [place], [place] [address]  CBC     Status: Abnormal   Collection Time: 04/25/19  1:03 PM  Result Value Ref Range   WBC 9.3 4.0 - 10.5 K/uL   RBC 3.79 (L) 4.22 - 5.81 MIL/uL   Hemoglobin 11.4 (L) 13.0 - 17.0 g/dL   HCT 09.2 (L) 33.0 - 07.6 %   MCV 90.0 80.0 - 100.0 fL   MCH 30.1 26.0 - 34.0 pg   MCHC 33.4 30.0 - 36.0 g/dL   RDW 22.6 33.3 - 54.5 %   Platelets 383 150 - 400 K/uL   nRBC 0.0 0.0 - 0.2 %    Comment: Performed at [facility] [place], [address]  [place], [place] [address]  Urinalysis, Complete w Microscopic     Status: Abnormal   Collection Time: 04/25/19  1:03 PM  Result Value Ref Range   Color, Urine YELLOW (A) YELLOW   APPearance HAZY (A) CLEAR   Specific Gravity, Urine 1.017 1.005 - 1.030   pH 5.0 5.0 - 8.0   Glucose, UA NEGATIVE NEGATIVE mg/dL   Hgb urine dipstick NEGATIVE NEGATIVE   Bilirubin Urine NEGATIVE NEGATIVE   Ketones, ur NEGATIVE NEGATIVE mg/dL   Protein, ur NEGATIVE NEGATIVE mg/dL   Nitrite NEGATIVE NEGATIVE   Leukocytes,Ua SMALL (A) NEGATIVE   RBC / HPF 6-10 0 - 5 RBC/hpf   WBC, UA 21-50 0 - 5 WBC/hpf   Bacteria, UA NONE SEEN NONE SEEN   Squamous Epithelial / LPF NONE SEEN 0 - 5   Mucus PRESENT    Hyaline Casts, UA PRESENT     Comment: Performed at [facility], [address] [place], [place] [address]  Protime-INR     Status: None   Collection Time: 04/25/19  4:17 PM  Result Value Ref Range   Prothrombin Time 13.6 11.4 - 15.2 seconds   INR 1.1 0.8 - 1.2    Comment: (NOTE) INR goal varies based on device and disease states. Performed at [facility], [address] [place], [place] [address]   Type and screen [facility]     Status: None   Collection Time: 04/25/19  4:17 PM  Result Value Ref Range   ABO/RH(D) O POS    Antibody Screen NEG    Sample Expiration      04/28/2019,2359 Performed at [facility], [address] [place], [place] [address]   SARS CORONAVIRUS 2 (TAT 6-24 HRS) Nasopharyngeal Nasopharyngeal Swab     Status: None   Collection Time: 04/25/19  5:07 PM   Specimen: Nasopharyngeal Swab  Result Value Ref Range   SARS Coronavirus 2 NEGATIVE NEGATIVE    Comment: (NOTE) SARS-CoV-2 target nucleic acids are NOT DETECTED. The SARS-CoV-2 RNA is generally detectable in upper and lower respiratory specimens during the acute  phase of infection. Negative results do not preclude SARS-CoV-2 infection, do not rule out co-infections with other  pathogens, and should not be used as the sole basis for treatment or other patient management decisions. Negative results must be combined with clinical observations, patient history, and epidemiological information. The expected result is Negative. Fact Sheet for Patients: HairSlick.no Fact Sheet for Healthcare Providers: quierodirigir.com This test is not yet approved or cleared by the [place] FDA and  has been authorized for detection and/or diagnosis of SARS-CoV-2 by FDA under an Emergency Use Authorization (EUA). This EUA will remain  in effect (meaning this test can be used) for the duration of the COVID-19 declaration under Section 56 4(b)(1) of the Act, 21 U.S.C. section 360bbb-3(b)(1), unless the authorization is terminated or revoked sooner. Performed at [facility], [address] [place], [place] [address]   CBC     Status: Abnormal   Collection Time: 04/26/19  6:30 AM  Result Value Ref Range   WBC 6.5 4.0 - 10.5 K/uL   RBC 3.76 (L) 4.22 - 5.81 MIL/uL   Hemoglobin 11.0 (L) 13.0 - 17.0 g/dL   HCT 60.4 (L) 54.0 - 98.1 %   MCV 84.6 80.0 - 100.0 fL   MCH 29.3 26.0 - 34.0 pg   MCHC 34.6 30.0 - 36.0 g/dL   RDW 19.1 47.8 - 29.5 %   Platelets 327 150 - 400 K/uL   nRBC 0.0 0.0 - 0.2 %    Comment: Performed at [facility], [address] [place], [place] [address]  Comprehensive metabolic panel     Status: Abnormal   Collection Time: 04/26/19  6:30 AM  Result Value Ref Range   Sodium 138 135 - 145 mmol/L   Potassium 3.7 3.5 - 5.1 mmol/L   Chloride 106 98 - 111 mmol/L   CO2 22 22 - 32 mmol/L   Glucose, Bld 88 70 - 99 mg/dL   BUN 23 8 - 23 mg/dL   Creatinine, Ser 8.65 0.61 - 1.24 mg/dL   Calcium 8.0 (L) 8.9 - 10.3 mg/dL   Total Protein 5.8 (L) 6.5 - 8.1 g/dL   Albumin 2.6 (L) 3.5 - 5.0 g/dL   AST 26 15 - 41 U/L   ALT 25 0 - 44 U/L   Alkaline Phosphatase 80 38 - 126 U/L   Total Bilirubin 0.7  0.3 - 1.2 mg/dL   GFR calc non Af Amer >60 >60 mL/min   GFR calc Af Amer >60 >60 mL/min   Anion gap 10 5 - 15    Comment: Performed at [facility], [address] [place], [place] [address]  Magnesium     Status: None   Collection Time: 04/26/19  6:30 AM  Result Value Ref Range   Magnesium 2.0 1.7 - 2.4 mg/dL    Comment: Performed at [facility], [address] [place], [place] [address]  CBC     Status: Abnormal   Collection Time: 04/27/19  6:25 AM  Result Value Ref Range   WBC 7.2 4.0 - 10.5 K/uL   RBC 3.48 (L) 4.22 - 5.81 MIL/uL   Hemoglobin 10.4 (L) 13.0 - 17.0 g/dL   HCT 84.1 (L) 32.4 - 40.1 %   MCV 87.6 80.0 - 100.0 fL   MCH 29.9 26.0 - 34.0 pg   MCHC 34.1 30.0 - 36.0 g/dL   RDW 02.7 25.3 - 66.4 %   Platelets 332 150 - 400 K/uL   nRBC  0.0 0.0 - 0.2 %    Comment: Performed at [facility], [address] [place], [place] [address]  Basic metabolic panel     Status: Abnormal   Collection Time: 04/27/19  6:25 AM  Result Value Ref Range   Sodium 138 135 - 145 mmol/L   Potassium 4.1 3.5 - 5.1 mmol/L   Chloride 105 98 - 111 mmol/L   CO2 25 22 - 32 mmol/L   Glucose, Bld 94 70 - 99 mg/dL   BUN 13 8 - 23 mg/dL   Creatinine, Ser 1.91 0.61 - 1.24 mg/dL   Calcium 7.8 (L) 8.9 - 10.3 mg/dL   GFR calc non Af Amer >60 >60 mL/min   GFR calc Af Amer >60 >60 mL/min   Anion gap 8 5 - 15    Comment: Performed at [facility], [address], [place], [place] [address]  Magnesium     Status: None   Collection Time: 04/27/19  6:25 AM  Result Value Ref Range   Magnesium 1.7 1.7 - 2.4 mg/dL    Comment: Performed at [facility], [address] [place], [place] [address]  Folate     Status: None   Collection Time: 04/27/19  4:31 PM  Result Value Ref Range   Folate 6.9 >5.9 ng/mL    Comment: Performed at [facility], [address] [place], [place] [address]  Iron and TIBC     Status: Abnormal   Collection Time: 04/27/19   4:31 PM  Result Value Ref Range   Iron 20 (L) 45 - 182 ug/dL   TIBC 846 (L) 962 - 952 ug/dL   Saturation Ratios 11 (L) 17.9 - 39.5 %   UIBC 167 ug/dL    Comment: Performed at [facility], [address] [place], [place] [address]  Reticulocytes     Status: Abnormal   Collection Time: 04/27/19  4:31 PM  Result Value Ref Range   Retic Ct Pct 2.0 0.4 - 3.1 %   RBC. 3.74 (L) 4.22 - 5.81 MIL/uL   Retic Count, Absolute 73.7 19.0 - 186.0 K/uL   Immature Retic Fract 9.7 2.3 - 15.9 %    Comment: Performed at [facility], [address] [place], [place] [address]  Vitamin B12     Status: None   Collection Time: 04/27/19  4:31 PM  Result Value Ref Range   Vitamin B-12 335 180 - 914 pg/mL    Comment: (NOTE) This assay is not validated for testing neonatal or myeloproliferative syndrome specimens for Vitamin B12 levels. Performed at [facility], [address], [place], [place] [address]   Ferritin     Status: None   Collection Time: 04/28/19  4:53 AM  Result Value Ref Range   Ferritin 151 24 - 336 ng/mL    Comment: Performed at [facility], [address] [place], [place] [address]  CBC     Status: Abnormal   Collection Time: 04/28/19  4:53 AM  Result Value Ref Range   WBC 6.1 4.0 - 10.5 K/uL   RBC 3.26 (L) 4.22 - 5.81 MIL/uL   Hemoglobin 9.6 (L) 13.0 - 17.0 g/dL   HCT 34.7 (L) 42.5 - 95.6 %   MCV 88.7 80.0 - 100.0 fL   MCH 29.4 26.0 - 34.0 pg   MCHC 33.2 30.0 - 36.0 g/dL   RDW 38.7 56.4 - 33.2 %   Platelets 323 150 - 400 K/uL  nRBC 0.0 0.0 - 0.2 %    Comment: Performed at [facility], [address] [place], [place] [address]  Basic metabolic panel     Status: Abnormal   Collection Time: 04/28/19  4:53 AM  Result Value Ref Range   Sodium 139 135 - 145 mmol/L   Potassium 4.5 3.5 - 5.1 mmol/L   Chloride 105 98 - 111 mmol/L   CO2 25 22 - 32 mmol/L   Glucose, Bld 85 70 - 99 mg/dL   BUN 11 8 - 23 mg/dL   Creatinine, Ser  6.040.89 0.61 - 1.24 mg/dL   Calcium 7.8 (L) 8.9 - 10.3 mg/dL   GFR calc non Af Amer >60 >60 mL/min   GFR calc Af Amer >60 >60 mL/min   Anion gap 9 5 - 15    Comment: Performed at [facility], [address] [place], [place] [address]  Magnesium     Status: None   Collection Time: 04/28/19  4:53 AM  Result Value Ref Range   Magnesium 2.4 1.7 - 2.4 mg/dL    Comment: Performed at [facility], [address] [place], [place] [address]  Surgical pathology     Status: None   Collection Time: 04/28/19 10:13 AM  Result Value Ref Range   SURGICAL PATHOLOGY      SURGICAL PATHOLOGY CASE: [id number] PATIENT: Lucianne MussKERNSIE Morissette Surgical Pathology Report     Specimen Submitted: A. Rectum ulcer, distal; cbx  Clinical History: WT loss, anemia, melena.  Gastric ulcers and gastric erosions, rectal ulcer, diverticulosis    DIAGNOSIS: A. RECTAL ULCER, DISTAL; COLD BIOPSY: - ULCERATION, GRANULATION TISSUE, AND ACUTE INFLAMMATION. - NEGATIVE FOR DYSPLASIA AND MALIGNANCY.  Comment: The histologic features are relatively nonspecific, but may be seen in the setting of ischemic colitis, NSAID, or stercoral ulcer.  Clinical correlation is recommended.  Immunohistochemical studies directed against CMV and Epstein-Barr virus are negative .  IHC slides were prepared by [facility], [place], [place]. All controls stained appropriately.  This test was developed and its performance characteristics determined by [facility]. It has not been cleared or approved by the US Food and Drug Administration. The F DA does not require this test to go through premarket FDA review. This test is used for clinical purposes. It should not be regarded as investigational or for research. This laboratory is certified under the Clinical Laboratory Improvement Amendments (CLIA) as qualified to perform high complexity clinical laboratory testing.   GROSS  DESCRIPTION: A. Labeled: C BX distal rectal ulcer Received: Formalin Tissue fragment(s): Multiple Size: Aggregate, 0.8 x 0.4 x 0.1 cm Description: Tan soft tissue fragments Entirely submitted in 1 cassette.    Final Diagnosis performed by Katherine MantleHeath Jones, MD.   Electronically signed 05/04/2019 12:01:48PM The electronic signature indicates that the named Attending Pathologist has evaluated the specimen Technical component performed at [facility], [address], [place], [place] [address] Lab: [phone] Dir: Jolene SchimkeSanjai Nagendra, MD, MMM  Professional component performed at [facility], [facility], [address], [place] , [place] [address] Lab: [phone] Dir: Georgiann Cockerara C. Rubinas, MD   H Pylori, IGM, IGG, IGA AB     Status: Abnormal   Collection Time: 04/29/19  4:41 AM  Result Value Ref Range   H. Pylogi, Iga Abs 10.6 (H) 0.0 - 8.9 units    Comment: (NOTE)  Negative          <9.0                                Equivocal   9.0 - 11.0                                Positive         >11.0    H. Pylogi, Igm Abs <9.0 0.0 - 8.9 units    Comment: (NOTE)                                Negative          <9.0                                Equivocal   9.0 - 11.0                                Positive         >11.0 This test was developed and its performance characteristics determined by [facility]. It has not been cleared or approved by the Food and Drug Administration. Performed At: [facility] [address] [place], [place] [address] Jolene Schimke MD WU:[id number]    H Pylori IgG 0.15 0.00 - 0.79 Index Value    Comment: (NOTE)                             Negative           <0.80                             Equivocal    0.80 - 0.89                             Positive           >0.89   CBC     Status: Abnormal   Collection Time: 04/29/19  4:41 AM  Result Value Ref Range   WBC 6.9 4.0 - 10.5 K/uL   RBC 2.95 (L) 4.22 - 5.81 MIL/uL    Hemoglobin 8.8 (L) 13.0 - 17.0 g/dL   HCT 95.6 (L) 21.3 - 08.6 %   MCV 89.5 80.0 - 100.0 fL   MCH 29.8 26.0 - 34.0 pg   MCHC 33.3 30.0 - 36.0 g/dL   RDW 57.8 46.9 - 62.9 %   Platelets 285 150 - 400 K/uL   nRBC 0.0 0.0 - 0.2 %    Comment: Performed at [facility], [address] [place], [place] [address]    Radiology No results found.  Assessment/Plan Essential hypertension blood pressure control important in reducing the progression of atherosclerotic disease. On appropriate oral medications.   Hyperlipidemia lipid control important in reducing the progression of atherosclerotic disease. Continue statin therapy   Loss of weight Chronic mesenteric ischemia is the cause.  This has improved some after revascularization and he is up 3 more pounds from his last visit.  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  Symptomatically he is better.  At this point, antiplatelet therapy and statin agent would be recommended to continue patency of the stent and optimal degree.  We will plan a follow-up in 3 months with mesenteric duplex and I will be happy to see him at any point sooner if problems develop in the interim.    [name], MD  07/10/2019 3:10 PM    This note was created with Dragon medical transcription system.  Any errors from dictation are purely unintentional

## 2019-07-10 NOTE — Assessment & Plan Note (Signed)
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  Symptomatically he is better.  At this point, antiplatelet therapy and statin agent would be recommended to continue patency of the stent and optimal degree.  We will plan a follow-up in 3 months with mesenteric duplex and I will be happy to see him at any point sooner if problems develop in the interim.

## 2019-08-12 ENCOUNTER — Other Ambulatory Visit (INDEPENDENT_AMBULATORY_CARE_PROVIDER_SITE_OTHER): Payer: Self-pay | Admitting: Vascular Surgery

## 2019-08-29 ENCOUNTER — Telehealth (INDEPENDENT_AMBULATORY_CARE_PROVIDER_SITE_OTHER): Payer: Medicare PPO | Admitting: Emergency Medicine

## 2019-08-29 ENCOUNTER — Other Ambulatory Visit: Payer: Self-pay

## 2019-08-29 ENCOUNTER — Encounter: Payer: Self-pay | Admitting: Emergency Medicine

## 2019-08-29 VITALS — Ht 67.0 in

## 2019-08-29 DIAGNOSIS — E785 Hyperlipidemia, unspecified: Secondary | ICD-10-CM

## 2019-08-29 DIAGNOSIS — E039 Hypothyroidism, unspecified: Secondary | ICD-10-CM | POA: Diagnosis not present

## 2019-08-29 DIAGNOSIS — K551 Chronic vascular disorders of intestine: Secondary | ICD-10-CM | POA: Diagnosis not present

## 2019-08-29 MED ORDER — LEVOTHYROXINE SODIUM 100 MCG PO TABS: 100.0000 ug | ORAL_TABLET | Freq: Every day | ORAL | 1 refills | Status: DC

## 2019-08-29 MED ORDER — SIMVASTATIN 20 MG PO TABS: 20.0000 mg | ORAL_TABLET | Freq: Every day | ORAL | 1 refills | Status: DC

## 2019-08-29 MED ORDER — CLOPIDOGREL BISULFATE 75 MG PO TABS: 75.0000 mg | ORAL_TABLET | Freq: Every day | ORAL | 1 refills | Status: DC

## 2019-08-29 NOTE — Progress Notes (Signed)
Med refill  Plavix  simvastain Synthroid

## 2019-08-29 NOTE — Progress Notes (Signed)
Telemedicine Encounter- SOAP NOTE Established Patient  This telephone encounter was conducted with the patient's (or proxy's) verbal consent via audio telecommunications: yes/no: Yes Patient was instructed to have this encounter in a suitably private space; and to only have persons present to whom they give permission to participate. In addition, patient identity was confirmed by use of name plus two identifiers (DOB and address).  I discussed the limitations, risks, security and privacy concerns of performing an evaluation and management service by telephone and the availability of in person appointments. I also discussed with the patient that there may be a patient responsible charge related to this service. The patient expressed understanding and agreed to proceed.  I spent a total of TIME; 0 MIN TO 60 MIN: 15 minutes talking with the patient or their proxy.  Chief Complaint  Patient presents with  . Hospitalization Follow-up    med refill     Subjective   Jeremy Merritt is a 85 y.o. male established patient.  Patient not well known to me.  I have not been his PCP.  Telephone visit today for medication refill.  Old records reviewed.  This is not a hospital follow-up visit. Recent vascular surgery office visit last month as follows: Assessment/Plan Essential hypertension blood pressure control important in reducing the progression of atherosclerotic disease. On appropriate oral medications.   Hyperlipidemia lipid control important in reducing the progression of atherosclerotic disease. Continue statin therapy   Loss of weight Chronic mesenteric ischemia is the cause.  This has improved some after revascularization and he is up 3 more pounds from his last visit.  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  Symptomatically he is better.  At this point, antiplatelet therapy and  statin agent would be recommended to continue patency of the stent and optimal degree.  We will plan a follow-up in 3 months with mesenteric duplex and I will be happy to see him at any point sooner if problems develop in the interim.    [name], MD  07/10/2019  HPI   Patient Active Problem List   Diagnosis Date Noted  . 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 Hypothyroidism 05/22/2019  . Lateral epicondylitis of elbow 05/22/2019  . Bilateral lower extremity edema 05/04/2019  . Pressure injury of skin 04/27/2019  . GI bleed 04/25/2019  . Protein-calorie malnutrition, severe 04/18/2019  . Essential hypertension 04/17/2019  . Hyperlipidemia 04/17/2019  . Loss of weight 04/17/2019  . Mesenteric ischemia (HCC) 04/17/2019  . Chronic mesenteric ischemia (HCC) 10/17/2018  . History of diverticulitis 10/17/2018  . Chronic abdominal pain 10/17/2018  . Abdominal pain, LLQ 09/21/2018  . Diverticulitis large intestine w/o perforation or abscess w/bleeding 09/21/2018  . Weight loss, non-intentional 09/21/2018  . Upper abdominal pain 06/15/2018  . Early satiety 06/15/2018  . Abdominal fullness 06/15/2018  . Constipation 06/15/2018    Past Medical History:  Diagnosis Date  . Allergy   . Bilateral lower extremity edema 05/04/2019  . Hyperlipidemia   . Hypertension   . Thyroid disease     Current Outpatient Medications  Medication Sig Dispense Refill  . aspirin EC 81 MG tablet Take 81 mg by mouth daily.    . calcium carbonate (TUMS) 500 MG chewable tablet Chew 500-1,000 mg by mouth daily as needed (acid reduction).     . Cholecalciferol (VITAMIN D3) 2000 units TABS Take 2,000 Units by mouth  daily.     . clopidogrel (PLAVIX) 75 MG tablet Take 1 tablet (75 mg total) by mouth daily. 90 tablet 1  . levothyroxine (SYNTHROID) 100 MCG tablet Take 1 tablet (100 mcg total) by mouth daily before breakfast. 90 tablet 1  . omeprazole (PRILOSEC) 40 MG capsule Take 1 capsule (40 mg  total) by mouth 2 (two) times daily. 60 capsule 2  . polyethylene glycol powder (GLYCOLAX/MIRALAX) 17 GM/SCOOP powder Take by mouth.    . simvastatin (ZOCOR) 20 MG tablet Take 1 tablet (20 mg total) by mouth at bedtime. 90 tablet 1  . cetirizine (ZYRTEC) 10 MG tablet Take 0.5-1 tablets (5-10 mg total) by mouth at bedtime. May stop taking daily after 6 weeks. (Patient not taking: Reported on 08/29/2019) 90 tablet 0  . dronabinol (MARINOL) 2.5 MG capsule Take 1 capsule (2.5 mg total) by mouth 2 (two) times daily before lunch and supper. 60 capsule 1  . feeding supplement, ENSURE ENLIVE, (ENSURE ENLIVE) LIQD Take 237 mLs by mouth 2 (two) times daily between meals. (Patient not taking: Reported on 08/29/2019) 237 mL 12  . megestrol (MEGACE) 40 MG/ML suspension Take by mouth daily.     No current facility-administered medications for this visit.    No Known Allergies  Social History   Socioeconomic History  . Marital status: Widowed    Spouse name: Not on file  . Number of children: 2  . Years of education: Not on file  . Highest education level: Not on file  Occupational History  . Occupation: retired  Tobacco Use  . Smoking status: Never Smoker  . Smokeless tobacco: Never Used  Substance and Sexual Activity  . Alcohol use: No  . Drug use: No  . Sexual activity: Never  Other Topics Concern  . Not on file  Social History Narrative  . Not on file   Social Determinants of Health   Financial Resource Strain:   . Difficulty of Paying Living Expenses:   Food Insecurity:   . Worried About Charity fundraiser in the Last Year:   . Arboriculturist in the Last Year:   Transportation Needs:   . Film/video editor (Medical):   Marland Kitchen Lack of Transportation (Non-Medical):   Physical Activity:   . Days of Exercise per Week:   . Minutes of Exercise per Session:   Stress:   . Feeling of Stress :   Social Connections:   . Frequency of Communication with Friends and Family:   . Frequency  of Social Gatherings with Friends and Family:   . Attends Religious Services:   . Active Member of Clubs or Organizations:   . Attends Archivist Meetings:   Marland Kitchen Marital Status:   Intimate Partner Violence:   . Fear of Current or Ex-Partner:   . Emotionally Abused:   Marland Kitchen Physically Abused:   . Sexually Abused:     Review of Systems  Constitutional: Negative.  Negative for chills and fever.  HENT: Negative for congestion and sore throat.   Respiratory: Negative.  Negative for cough and shortness of breath.   Cardiovascular: Negative for chest pain and palpitations.  Gastrointestinal: Negative for abdominal pain, blood in stool, melena, nausea and vomiting.  Genitourinary: Negative for hematuria.  Skin: Negative.  Negative for rash.  Neurological: Negative for dizziness and headaches.  All other systems reviewed and are negative.   Objective  Alert and oriented x3 in no apparent respiratory distress. Vitals as reported by the patient:  Today's Vitals   08/29/19 1419  Height: 5\' 7"  (1.702 m)    Domonic was seen today for hospitalization follow-up.  Diagnoses and all orders for this visit:  Chronic mesenteric ischemia (HCC) -     clopidogrel (PLAVIX) 75 MG tablet; Take 1 tablet (75 mg total) by mouth daily.  Hypothyroidism, unspecified type -     levothyroxine (SYNTHROID) 100 MCG tablet; Take 1 tablet (100 mcg total) by mouth daily before breakfast.  Dyslipidemia -     simvastatin (ZOCOR) 20 MG tablet; Take 1 tablet (20 mg total) by mouth at bedtime.     I discussed the assessment and treatment plan with the patient. The patient was provided an opportunity to ask questions and all were answered. The patient agreed with the plan and demonstrated an understanding of the instructions.   The patient was advised to call back or seek an in-person evaluation if the symptoms worsen or if the condition fails to improve as anticipated.  I provided 15 minutes of  non-face-to-face time during this encounter.  Caleb Popp, MD  Primary Care at [facility]

## 2019-10-12 ENCOUNTER — Encounter (INDEPENDENT_AMBULATORY_CARE_PROVIDER_SITE_OTHER): Payer: Medicare PPO

## 2019-10-12 ENCOUNTER — Ambulatory Visit (INDEPENDENT_AMBULATORY_CARE_PROVIDER_SITE_OTHER): Payer: Medicare PPO | Admitting: Nurse Practitioner

## 2019-10-14 ENCOUNTER — Emergency Department
Admission: EM | Admit: 2019-10-14 | Discharge: 2019-10-14 | Disposition: A | Discharge status: Home / Self Care | Payer: Medicare PPO | Attending: Emergency Medicine | Admitting: Emergency Medicine

## 2019-10-14 ENCOUNTER — Encounter: Payer: Self-pay | Admitting: Emergency Medicine

## 2019-10-14 ENCOUNTER — Other Ambulatory Visit: Payer: Self-pay

## 2019-10-14 ENCOUNTER — Emergency Department: Payer: Medicare PPO

## 2019-10-14 DIAGNOSIS — Z7902 Long term (current) use of antithrombotics/antiplatelets: Secondary | ICD-10-CM | POA: Insufficient documentation

## 2019-10-14 DIAGNOSIS — I10 Essential (primary) hypertension: Secondary | ICD-10-CM | POA: Insufficient documentation

## 2019-10-14 DIAGNOSIS — R531 Weakness: Secondary | ICD-10-CM | POA: Diagnosis present

## 2019-10-14 DIAGNOSIS — E860 Dehydration: Secondary | ICD-10-CM | POA: Diagnosis not present

## 2019-10-14 DIAGNOSIS — Z20822 Contact with and (suspected) exposure to covid-19: Secondary | ICD-10-CM | POA: Diagnosis not present

## 2019-10-14 DIAGNOSIS — N3000 Acute cystitis without hematuria: Secondary | ICD-10-CM | POA: Diagnosis not present

## 2019-10-14 DIAGNOSIS — E079 Disorder of thyroid, unspecified: Secondary | ICD-10-CM | POA: Diagnosis not present

## 2019-10-14 DIAGNOSIS — N179 Acute kidney failure, unspecified: Secondary | ICD-10-CM | POA: Diagnosis not present

## 2019-10-14 DIAGNOSIS — R0602 Shortness of breath: Secondary | ICD-10-CM | POA: Diagnosis not present

## 2019-10-14 DIAGNOSIS — Z79899 Other long term (current) drug therapy: Secondary | ICD-10-CM | POA: Diagnosis not present

## 2019-10-14 DIAGNOSIS — J90 Pleural effusion, not elsewhere classified: Secondary | ICD-10-CM | POA: Diagnosis not present

## 2019-10-14 LAB — BASIC METABOLIC PANEL
Anion gap: 10 (ref 5–15)
BUN: 18 mg/dL (ref 8–23)
CO2: 23 mmol/L (ref 22–32)
Calcium: 8.7 mg/dL — ABNORMAL LOW (ref 8.9–10.3)
Chloride: 108 mmol/L (ref 98–111)
Creatinine, Ser: 1.37 mg/dL — ABNORMAL HIGH (ref 0.61–1.24)
GFR calc Af Amer: 55 mL/min — ABNORMAL LOW (ref >60)
GFR calc non Af Amer: 48 mL/min — ABNORMAL LOW (ref >60)
Glucose, Bld: 130 mg/dL — ABNORMAL HIGH (ref 70–99)
Potassium: 3.7 mmol/L (ref 3.5–5.1)
Sodium: 141 mmol/L (ref 135–145)

## 2019-10-14 LAB — CBC
HCT: 42.5 % (ref 39.0–52.0)
Hemoglobin: 13.8 g/dL (ref 13.0–17.0)
MCH: 27.8 pg (ref 26.0–34.0)
MCHC: 32.5 g/dL (ref 30.0–36.0)
MCV: 85.7 fL (ref 80.0–100.0)
Platelets: 343 10*3/uL (ref 150–400)
RBC: 4.96 MIL/uL (ref 4.22–5.81)
RDW: 15.9 % — ABNORMAL HIGH (ref 11.5–15.5)
WBC: 8.5 10*3/uL (ref 4.0–10.5)
nRBC: 0 % (ref 0.0–0.2)

## 2019-10-14 LAB — URINALYSIS, COMPLETE (UACMP) WITH MICROSCOPIC
Bilirubin Urine: NEGATIVE
Glucose, UA: NEGATIVE mg/dL
Hgb urine dipstick: NEGATIVE
Ketones, ur: NEGATIVE mg/dL
Nitrite: NEGATIVE
Protein, ur: NEGATIVE mg/dL
Specific Gravity, Urine: 1.019 (ref 1.005–1.030)
pH: 5 (ref 5.0–8.0)

## 2019-10-14 LAB — RESPIRATORY PANEL BY RT PCR (FLU A&B, COVID)
Influenza A by PCR: NEGATIVE
Influenza B by PCR: NEGATIVE
SARS Coronavirus 2 by RT PCR: NEGATIVE

## 2019-10-14 LAB — TROPONIN I (HIGH SENSITIVITY)
Troponin I (High Sensitivity): 7 ng/L (ref <18)
Troponin I (High Sensitivity): 8 ng/L (ref <18)

## 2019-10-14 LAB — T4, FREE: Free T4: 0.98 ng/dL (ref 0.61–1.12)

## 2019-10-14 LAB — TSH: TSH: 10.961 u[IU]/mL — ABNORMAL HIGH (ref 0.350–4.500)

## 2019-10-14 LAB — GLUCOSE, CAPILLARY: Glucose-Capillary: 116 mg/dL — ABNORMAL HIGH (ref 70–99)

## 2019-10-14 MED ORDER — SODIUM CHLORIDE 0.9 % IV BOLUS
500.0000 mL | Freq: Once | INTRAVENOUS | Status: AC
  Administered 2019-10-14: 500 mL via INTRAVENOUS

## 2019-10-14 MED ORDER — CEPHALEXIN 250 MG PO CAPS: 250.0000 mg | ORAL_CAPSULE | Freq: Four times a day (QID) | ORAL | 0 refills | Status: AC

## 2019-10-14 NOTE — ED Notes (Signed)
Pt ambulated around ED hallway with smooth/steady gait and no incident.  Pt placed back on bedside monitor.  No acute changes or s/s of distress.  Will continue to monitor/reasess.

## 2019-10-14 NOTE — Discharge Instructions (Addendum)
IMPRESSION: There was signs of some mild dehydration and possible UTI which would cover him with some antibiotics.  He should follow-up with his primary care doctor in 2 to 3 days to make sure that his symptoms are getting slightly better.  Important that he stays hydrated and eats full balanced meals.  His x-ray is as below and he is to follow-up for outpatient CT scan.  There are many things that can cause generalized weakness it is important that he follows with a primary care doctor for this  Return to the ER for worsening shortness of breath, fevers or any other concerns  1. Small bilateral pleural effusions. 2. Nodularity noted in bilateral upper lobes, not significantly changed by the appearance on chest CT dated March 16, 2019, accounting for differences in technique. Per the recommendation of the CT report, evaluation with follow-up chest CT is recommended.

## 2019-10-14 NOTE — ED Triage Notes (Signed)
First RN Note: pt presents to ED via POV with c/o weakness x several days. Pt ambulatory to triage desk, provided with wheelchair.

## 2019-10-14 NOTE — ED Provider Notes (Signed)
[facility] Emergency Department Provider Note  ____________________________________________   First MD Initiated Contact with Patient 10/14/19 1205     (approximate)  I have reviewed the triage vital signs and the nursing notes.   HISTORY  Chief Complaint Weakness    HPI RAND ETCHISON is a 85 y.o. male with hypertension, hyperlipidemia, thyroid disease who comes in with generalized weakness.  Per patient he states that he has been feeling weak for several months now.  He states that has not been significantly worsened over the past few days.  Patient states that the weakness is mild, nothing makes better, nothing makes it worse.  He states that he will occasionally have a little bit of shortness of breath but nothing severe.  Denies any cough, abdominal pain, urinary symptoms.  Patient states that he is followed at the [facility] with his primary care doctor.  I asked him why they came in today and daughter is at bedside and stated that he has not been as active as normal.  To note he has been under a lot of stress and sadness recently because his son passed away and he had witnessed it.  He denies any SI.  he states that he feels sad.  Patient states that he is not eating or drink anything all day.  I asked him why he said he did not really know why          Past Medical History:  Diagnosis Date  . Allergy   . Bilateral lower extremity edema 05/04/2019  . Hyperlipidemia   . Hypertension   . Thyroid disease     Patient Active Problem List   Diagnosis Date Noted  . 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 Hypothyroidism 05/22/2019  . Lateral epicondylitis of elbow 05/22/2019  . Bilateral lower extremity edema 05/04/2019  . Pressure injury of skin 04/27/2019  . GI bleed 04/25/2019  . Protein-calorie malnutrition, severe 04/18/2019  . Essential hypertension 04/17/2019  . Hyperlipidemia 04/17/2019  . Loss of weight 04/17/2019  . Mesenteric  ischemia (HCC) 04/17/2019  . Chronic mesenteric ischemia (HCC) 10/17/2018  . History of diverticulitis 10/17/2018  . Chronic abdominal pain 10/17/2018  . Abdominal pain, LLQ 09/21/2018  . Diverticulitis large intestine w/o perforation or abscess w/bleeding 09/21/2018  . Weight loss, non-intentional 09/21/2018  . Upper abdominal pain 06/15/2018  . Early satiety 06/15/2018  . Abdominal fullness 06/15/2018  . Constipation 06/15/2018    Past Surgical History:  Procedure Laterality Date  . COLONOSCOPY WITH PROPOFOL N/A 04/28/2019   Procedure: COLONOSCOPY WITH PROPOFOL;  Surgeon: Toney Reil, MD;  Location: [facility] ENDOSCOPY;  Service: Gastroenterology;  Laterality: N/A;  . ESOPHAGOGASTRODUODENOSCOPY (EGD) WITH PROPOFOL N/A 04/28/2019   Procedure: ESOPHAGOGASTRODUODENOSCOPY (EGD) WITH PROPOFOL;  Surgeon: Toney Reil, MD;  Location: [facility] ENDOSCOPY;  Service: Gastroenterology;  Laterality: N/A;  . INGUINAL HERNIA REPAIR    . VISCERAL ANGIOGRAPHY N/A 04/18/2019   Procedure: VISCERAL ANGIOGRAPHY;  Surgeon: Annice Needy, MD;  Location: [facility] INVASIVE CV LAB;  Service: Cardiovascular;  Laterality: N/A;    Prior to Admission medications   Medication Sig Start Date End Date Taking? Authorizing Provider  aspirin EC 81 MG tablet Take 81 mg by mouth daily.    [provider]  calcium carbonate (TUMS) 500 MG chewable tablet Chew 500-1,000 mg by mouth daily as needed (acid reduction).     [provider]  cetirizine (ZYRTEC) 10 MG tablet Take 0.5-1 tablets (5-10 mg total) by mouth at  bedtime. May stop taking daily after 6 weeks. Patient not taking: Reported on 08/29/2019 10/14/16   Magdalene River, PA-C  Cholecalciferol (VITAMIN D3) 2000 units TABS Take 2,000 Units by mouth daily.     [provider]  clopidogrel (PLAVIX) 75 MG tablet Take 1 tablet (75 mg total) by mouth daily. 08/29/19   Georgina Quint, MD  dronabinol (MARINOL) 2.5 MG capsule Take 1  capsule (2.5 mg total) by mouth 2 (two) times daily before lunch and supper. 04/29/19   Pennie Banter, DO  feeding supplement, ENSURE ENLIVE, (ENSURE ENLIVE) LIQD Take 237 mLs by mouth 2 (two) times daily between meals. Patient not taking: Reported on 08/29/2019 04/19/19   Stegmayer, Ranae Plumber, PA-C  levothyroxine (SYNTHROID) 100 MCG tablet Take 1 tablet (100 mcg total) by mouth daily before breakfast. 08/29/19 11/27/19  Georgina Quint, MD  megestrol (MEGACE) 40 MG/ML suspension Take by mouth daily.    [provider]  omeprazole (PRILOSEC) 40 MG capsule Take 1 capsule (40 mg total) by mouth 2 (two) times daily. 04/29/19   Pennie Banter, DO  polyethylene glycol powder (GLYCOLAX/MIRALAX) 17 GM/SCOOP powder Take by mouth. 05/22/19   [provider]  simvastatin (ZOCOR) 20 MG tablet Take 1 tablet (20 mg total) by mouth at bedtime. 08/29/19 11/27/19  Georgina Quint, MD    Allergies Patient has no known allergies.  Family History  Problem Relation Age of Onset  . Other Mother        died of old age  . Other Father        Black lung  . Thyroid cancer Sister   . Kidney failure Brother        on dialysis  . Colon cancer Neg Hx   . Liver cancer Neg Hx   . Stomach cancer Neg Hx     Social History Social History   Tobacco Use  . Smoking status: Never Smoker  . Smokeless tobacco: Never Used  Substance Use Topics  . Alcohol use: No  . Drug use: No      Review of Systems Constitutional: No fever/chills positive weakness Eyes: No visual changes. ENT: No sore throat. Cardiovascular: Denies chest pain. Respiratory: Positive mild shortness of breath Gastrointestinal: No abdominal pain.  No nausea, no vomiting.  No diarrhea.  No constipation. Genitourinary: Negative for dysuria. Musculoskeletal: Negative for back pain. Skin: Negative for rash. Neurological: Negative for headaches, focal weakness or numbness. Psych: Positive sadness All other ROS  negative ____________________________________________   PHYSICAL EXAM:  VITAL SIGNS: ED Triage Vitals  Enc Vitals Group     BP 10/14/19 0917 (!) 148/80     Pulse Rate 10/14/19 0917 (!) 113     Resp 10/14/19 0917 20     Temp 10/14/19 0917 97.6 F (36.4 C)     Temp Source 10/14/19 0917 Oral     SpO2 10/14/19 0917 99 %     Weight 10/14/19 0906 130 lb (59 kg)     Height 10/14/19 0906 5' 7.5" (1.715 m)     Head Circumference --      Peak Flow --      Pain Score 10/14/19 0906 0     Pain Loc --      Pain Edu? --      Excl. in GC? --     Constitutional: Alert and oriented. Well appearing and in no acute distress. Eyes: Conjunctivae are normal. EOMI. Head: Atraumatic. Nose: No congestion/rhinnorhea. Mouth/Throat: Mucous membranes are  moist.   Neck: No stridor. Trachea Midline. FROM Cardiovascular: Normal rate, regular rhythm. Grossly normal heart sounds.  Good peripheral circulation. Respiratory: Normal respiratory effort.  No retractions. Lungs CTAB. Gastrointestinal: Soft and nontender. No distention. No abdominal bruits.  Musculoskeletal: No lower extremity tenderness nor edema.  No joint effusions. Neurologic:  Normal speech and language. No gross focal neurologic deficits are appreciated.  Cranial nerves II to XII appear intact.  Equal strength in arms and legs. Skin:  Skin is warm, dry and intact. No rash noted. Psychiatric: Mood and affect are normal. Speech and behavior are normal. GU: Deferred   ____________________________________________   LABS (all labs ordered are listed, but only abnormal results are displayed)  Labs Reviewed  BASIC METABOLIC PANEL - Abnormal; Notable for the following components:      Result Value   Glucose, Bld 130 (*)    Creatinine, Ser 1.37 (*)    Calcium 8.7 (*)    GFR calc non Af Amer 48 (*)    GFR calc Af Amer 55 (*)    All other components within normal limits  CBC - Abnormal; Notable for the following components:   RDW 15.9 (*)     All other components within normal limits  URINALYSIS, COMPLETE (UACMP) WITH MICROSCOPIC - Abnormal; Notable for the following components:   Color, Urine AMBER (*)    APPearance CLOUDY (*)    Leukocytes,Ua TRACE (*)    Bacteria, UA MANY (*)    All other components within normal limits  GLUCOSE, CAPILLARY - Abnormal; Notable for the following components:   Glucose-Capillary 116 (*)    All other components within normal limits  TSH - Abnormal; Notable for the following components:   TSH 10.961 (*)    All other components within normal limits  RESPIRATORY PANEL BY RT PCR (FLU A&B, COVID)  T4, FREE  CBG MONITORING, ED  TROPONIN I (HIGH SENSITIVITY)  TROPONIN I (HIGH SENSITIVITY)   ____________________________________________   ED ECG REPORT I, Concha Se, the attending physician, personally viewed and interpreted this ECG.  EKG sinus tachycardia rate of 119, no ST elevation, no T wave inversions, normal intervals ____________________________________________  RADIOLOGY Vela Prose, personally viewed and evaluated these images (plain radiographs) as part of my medical decision making, as well as reviewing the written report by the radiologist.  ED MD interpretation:  Nodularities in the upper lobes  Official radiology report(s): DG Chest Portable 1 View  Result Date: 10/14/2019 CLINICAL DATA:  Weakness for several days. EXAM: PORTABLE CHEST 1 VIEW COMPARISON:  Oct 20, 2018 FINDINGS: Cardiomediastinal silhouette is normal. Mediastinal contours appear intact. Calcific atherosclerotic disease of the aorta. Small bilateral pleural effusions. Nodularity noted in bilateral upper lobes, better evaluated by chest CT dated March 16, 2019. Osseous structures are without acute abnormality. Soft tissues are grossly normal. IMPRESSION: 1. Small bilateral pleural effusions. 2. Nodularity noted in bilateral upper lobes, not significantly changed by the appearance on chest CT dated March 16, 2019, accounting for differences in technique. Per the recommendation of the CT report, evaluation with follow-up chest CT is recommended. Electronically Signed   By: Ted Mcalpine M.D.   On: 10/14/2019 12:37    ____________________________________________   PROCEDURES  Procedure(s) performed (including Critical Care):  Procedures   ____________________________________________   INITIAL IMPRESSION / ASSESSMENT AND PLAN / ED COURSE  DOCTOR SHEAHAN was evaluated in Emergency Department on 10/14/2019 for the symptoms described in the history of present illness. He was evaluated  in the context of the global COVID-19 pandemic, which necessitated consideration that the patient might be at risk for infection with the SARS-CoV-2 virus that causes COVID-19. Institutional protocols and algorithms that pertain to the evaluation of patients at risk for COVID-19 are in a state of rapid change based on information released by regulatory bodies including the CDC and federal and state organizations. These policies and algorithms were followed during the patient's care in the ED.    Patient is a 85 year old who comes in with generalized weakness over the past few months and daughter wanted him to be further evaluated.  Has had a recent traumatic event to most likely adjustment disorder related to this.  Denies any SI.  Will get labs to evaluate for AKI, anemia, UTI, chest x-ray to evaluate for pneumonia.  I have low suspicion for PE given he states that the shortness of breath is very mild and does not have any at this time.  His oxygen levels are 100%.  We can test for Covid though.  He has no abdominal tenderness to suggest abdominal infections  Patient's kidney function is slightly elevated 1.37.  Patient not eating or drinking all morning.  Suspect a little dehydration specially at this tachycardia  Cardiac markers rule out for ACS  Free T4 not abnormal, abnormal TSH  UA concerning for  possible UTI.  Will cover with some antibiotics  Chest x-ray concerning for nodule that patient requires outpatient CT for.  Covid swab was negative.  Reevaluated patient patient was able to ambulate without any difficulties.  At this time do not think patient meets admission criteria given he is so well-appearing.  His heart rate has come down with the fluids.  Suspect that he was slightly dehydrated.  Offered psychiatric and therapy resources but patient declined.  We discussed a course of antibiotics for possible UTI and to follow-up with his primary care doctor for further work-up for his generalized weakness and CT imaging of his chest for the concern of his nodules.  Patient and daughter both expressed understanding and felt comfortable with this plan  I discussed the provisional nature of ED diagnosis, the treatment so far, the ongoing plan of care, follow up appointments and return precautions with the patient and any family or support people present. They expressed understanding and agreed with the plan, discharged home.          ____________________________________________   FINAL CLINICAL IMPRESSION(S) / ED DIAGNOSES   Final diagnoses:  Dehydration  AKI (acute kidney injury) ([place])  Acute cystitis without hematuria      MEDICATIONS GIVEN DURING THIS VISIT:  Medications  sodium chloride 0.9 % bolus 500 mL (0 mLs Intravenous Stopped 10/14/19 1458)     ED Discharge Orders         Ordered    cephALEXin (KEFLEX) 250 MG capsule  4 times daily     10/14/19 1521           Note:  This document was prepared using Dragon voice recognition software and may include unintentional dictation errors.   Vanessa Aldrich, MD 10/14/19 [phone]

## 2019-10-14 NOTE — ED Notes (Signed)
Repeat VS obtained by this RN. Pt visualized in NAD, this RN apologized for wait at this time. Pt states understanding.

## 2019-10-16 LAB — URINE CULTURE: Culture: >=100000 — AB

## 2019-10-18 ENCOUNTER — Encounter (INDEPENDENT_AMBULATORY_CARE_PROVIDER_SITE_OTHER): Payer: Self-pay | Admitting: Nurse Practitioner

## 2019-10-18 ENCOUNTER — Other Ambulatory Visit: Payer: Self-pay

## 2019-10-18 ENCOUNTER — Ambulatory Visit (INDEPENDENT_AMBULATORY_CARE_PROVIDER_SITE_OTHER): Payer: Medicare PPO | Admitting: Nurse Practitioner

## 2019-10-18 ENCOUNTER — Ambulatory Visit (INDEPENDENT_AMBULATORY_CARE_PROVIDER_SITE_OTHER): Payer: Medicare PPO

## 2019-10-18 VITALS — BP 185/78 | HR 88 | Resp 16 | Wt 120.4 lb

## 2019-10-18 DIAGNOSIS — K551 Chronic vascular disorders of intestine: Secondary | ICD-10-CM

## 2019-10-18 DIAGNOSIS — E785 Hyperlipidemia, unspecified: Secondary | ICD-10-CM | POA: Diagnosis not present

## 2019-10-18 DIAGNOSIS — R634 Abnormal weight loss: Secondary | ICD-10-CM

## 2019-10-18 NOTE — Progress Notes (Signed)
Subjective:    Patient ID: Jeremy Merritt, male    DOB: 1934-09-12, 85 y.o.   MRN: [id number] Chief Complaint  Patient presents with  . 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  The patient denies abdominal pain or postprandial symptoms.  The patient denies weight loss as well as nausea.  The patient does not substantiate food fear, particular foods do not seem to aggravate or alleviate the symptoms.  The patient denies bloody bowel movements or diarrhea.    The patient's most recent intervention was 04/18/2019 with the SMA angioplasty and stent placement.  The patient denies amaurosis fugax or recent TIA symptoms. There are no recent neurological changes noted. The patient denies claudication symptoms or rest pain symptoms. The patient denies history of DVT, PE or superficial thrombophlebitis. The patient denies recent episodes of angina   Today the largest aortic diameter is 2.0 cm.  The mesenteric shows a 70 to 99% stenosis in the celiac as well as the SMA.  The velocities for the celiac artery are increased compared to previous study on 05/16/2019.   Review of Systems  Constitutional: Negative for appetite change.  Gastrointestinal: Negative for abdominal pain, nausea and vomiting.  All other systems reviewed and are negative.      Objective:   Physical Exam Vitals reviewed.  Constitutional:      Appearance: Normal appearance.  Cardiovascular:     Rate and Rhythm: Normal rate and regular rhythm.     Pulses: Normal pulses.     Heart sounds: Normal heart sounds.  Pulmonary:     Effort: Pulmonary effort is normal.     Breath sounds: Normal breath sounds.  Abdominal:     General: Abdomen is flat. Bowel sounds are normal.     Palpations: Abdomen is soft.  Skin:    General: Skin is warm and dry.  Neurological:     Mental Status:  He is alert and oriented to person, place, and time.  Psychiatric:        Mood and Affect: Mood normal.        Behavior: Behavior normal.        Thought Content: Thought content normal.        Judgment: Judgment normal.     BP (!) 185/78 (BP Location: Right Arm)   Pulse 88   Resp 16   Wt 120 lb 6.4 oz (54.6 kg)   BMI 18.58 kg/m   Past Medical History:  Diagnosis Date  . Allergy   . Bilateral lower extremity edema 05/04/2019  . Hyperlipidemia   . Hypertension   . Thyroid disease     Social History   Socioeconomic History  . Marital status: Widowed    Spouse name: Not on file  . Number of children: 2  . Years of education: Not on file  . Highest education level: Not on file  Occupational History  . Occupation: retired  Tobacco Use  . Smoking status: Never Smoker  . Smokeless tobacco: Never Used  Substance and Sexual Activity  . Alcohol use: No  . Drug use: No  . Sexual activity: Never  Other Topics Concern  . Not on file  Social History Narrative  . Not on file   Social Determinants of Health   Financial Resource Strain:   . Difficulty of Paying Living Expenses:   Food Insecurity:   .  Worried About Programme researcher, broadcasting/film/video in the Last Year:   . Barista in the Last Year:   Transportation Needs:   . Freight forwarder (Medical):   Marland Kitchen Lack of Transportation (Non-Medical):   Physical Activity:   . Days of Exercise per Week:   . Minutes of Exercise per Session:   Stress:   . Feeling of Stress :   Social Connections:   . Frequency of Communication with Friends and Family:   . Frequency of Social Gatherings with Friends and Family:   . Attends Religious Services:   . Active Member of Clubs or Organizations:   . Attends Banker Meetings:   Marland Kitchen Marital Status:   Intimate Partner Violence:   . Fear of Current or Ex-Partner:   . Emotionally Abused:   Marland Kitchen Physically Abused:   . Sexually Abused:     Past Surgical History:  Procedure  Laterality Date  . COLONOSCOPY WITH PROPOFOL N/A 04/28/2019   Procedure: COLONOSCOPY WITH PROPOFOL;  Surgeon: Toney Reil, MD;  Location: [facility] ENDOSCOPY;  Service: Gastroenterology;  Laterality: N/A;  . ESOPHAGOGASTRODUODENOSCOPY (EGD) WITH PROPOFOL N/A 04/28/2019   Procedure: ESOPHAGOGASTRODUODENOSCOPY (EGD) WITH PROPOFOL;  Surgeon: Toney Reil, MD;  Location: [facility] ENDOSCOPY;  Service: Gastroenterology;  Laterality: N/A;  . INGUINAL HERNIA REPAIR    . VISCERAL ANGIOGRAPHY N/A 04/18/2019   Procedure: VISCERAL ANGIOGRAPHY;  Surgeon: Annice Needy, MD;  Location: [facility] INVASIVE CV LAB;  Service: Cardiovascular;  Laterality: N/A;    Family History  Problem Relation Age of Onset  . Other Mother        died of old age  . Other Father        Black lung  . Thyroid cancer Sister   . Kidney failure Brother        on dialysis  . Colon cancer Neg Hx   . Liver cancer Neg Hx   . Stomach cancer Neg Hx     No Known Allergies     Assessment & Plan:   1. Chronic mesenteric ischemia (HCC) Duplex today shows 70 to 99% stenosis within the SMA and celiac artery.  However on the celiac artery has had increase in velocities.  However symptomatically the patient is better.  At this point we will continue with his dual antiplatelet therapy in addition to his statin agent.  Because currently the patient does not have any symptoms and has been adequately gaining weight we will continue with close follow-up.  The patient return in 3 months with a mesenteric duplex.  However the patient and family are advised to contact her office sooner if the patient begins developing symptoms.  2. Hyperlipidemia, unspecified hyperlipidemia type Continue statin as ordered and reviewed, no changes at this time   3. Weight loss, non-intentional Since the patient's last office visit he has gained approximately 20 pounds which is a great improvement.  Patient encouraged to continue eating.   Current Outpatient  Medications on File Prior to Visit  Medication Sig Dispense Refill  . aspirin EC 81 MG tablet Take 81 mg by mouth daily.    . calcium carbonate (TUMS) 500 MG chewable tablet Chew 500-1,000 mg by mouth daily as needed (acid reduction).     . cephALEXin (KEFLEX) 250 MG capsule Take 1 capsule (250 mg total) by mouth 4 (four) times daily for 7 days. 28 capsule 0  . Cholecalciferol (VITAMIN D3) 2000 units TABS Take 2,000 Units by mouth daily.     Marland Kitchen  clopidogrel (PLAVIX) 75 MG tablet Take 1 tablet (75 mg total) by mouth daily. 90 tablet 1  . levothyroxine (SYNTHROID) 100 MCG tablet Take 1 tablet (100 mcg total) by mouth daily before breakfast. 90 tablet 1  . megestrol (MEGACE) 40 MG tablet Take 40 mg by mouth daily.    Marland Kitchen omeprazole (PRILOSEC) 40 MG capsule Take 1 capsule (40 mg total) by mouth 2 (two) times daily. 60 capsule 2  . polyethylene glycol powder (GLYCOLAX/MIRALAX) 17 GM/SCOOP powder Take 17 g by mouth daily.     . simvastatin (ZOCOR) 20 MG tablet Take 1 tablet (20 mg total) by mouth at bedtime. 90 tablet 1  . dronabinol (MARINOL) 2.5 MG capsule Take 1 capsule (2.5 mg total) by mouth 2 (two) times daily before lunch and supper. (Patient not taking: Reported on 10/14/2019) 60 capsule 1   No current facility-administered medications on file prior to visit.    There are no Patient Instructions on file for this visit. No follow-ups on file.   Georgiana Spinner, NP

## 2019-11-20 ENCOUNTER — Telehealth: Payer: Self-pay

## 2019-11-20 NOTE — Telephone Encounter (Signed)
Copied from CRM [id number]. Topic: General - Other >> Nov 19, 2019  1:15 PM Jaquita Rector A wrote: Reason for CRM: Patient called to inform Dr Alvy Bimler that he received his clopidogrel (PLAVIX) 75 MG tablet but states that he is not taking the medication and will not take this medication. Patient can be reached at Ph# [phone]

## 2019-11-20 NOTE — Telephone Encounter (Signed)
Appt made for pt for 11/29/2019 for him to discuss medications as pt feels he is taking too many and notes he doesn't know what a lot of them are for.

## 2019-11-27 ENCOUNTER — Emergency Department (HOSPITAL_COMMUNITY): Payer: Medicare PPO

## 2019-11-27 ENCOUNTER — Other Ambulatory Visit: Payer: Self-pay

## 2019-11-27 ENCOUNTER — Emergency Department (HOSPITAL_COMMUNITY)
Admission: EM | Admit: 2019-11-27 | Discharge: 2019-11-27 | Disposition: A | Discharge status: Home / Self Care | Payer: Medicare PPO | Attending: Emergency Medicine | Admitting: Emergency Medicine

## 2019-11-27 ENCOUNTER — Encounter (HOSPITAL_COMMUNITY): Payer: Self-pay | Admitting: Emergency Medicine

## 2019-11-27 DIAGNOSIS — N3000 Acute cystitis without hematuria: Secondary | ICD-10-CM | POA: Diagnosis not present

## 2019-11-27 DIAGNOSIS — E039 Hypothyroidism, unspecified: Secondary | ICD-10-CM | POA: Insufficient documentation

## 2019-11-27 DIAGNOSIS — Z79890 Hormone replacement therapy (postmenopausal): Secondary | ICD-10-CM | POA: Insufficient documentation

## 2019-11-27 DIAGNOSIS — R0602 Shortness of breath: Secondary | ICD-10-CM | POA: Insufficient documentation

## 2019-11-27 DIAGNOSIS — I10 Essential (primary) hypertension: Secondary | ICD-10-CM | POA: Insufficient documentation

## 2019-11-27 DIAGNOSIS — R531 Weakness: Secondary | ICD-10-CM | POA: Diagnosis present

## 2019-11-27 DIAGNOSIS — Z7982 Long term (current) use of aspirin: Secondary | ICD-10-CM | POA: Diagnosis not present

## 2019-11-27 LAB — CBG MONITORING, ED
Glucose-Capillary: 89 mg/dL (ref 70–99)
Glucose-Capillary: 81 mg/dL (ref 70–99)

## 2019-11-27 LAB — CBC
HCT: 37.4 % — ABNORMAL LOW (ref 39.0–52.0)
Hemoglobin: 12.1 g/dL — ABNORMAL LOW (ref 13.0–17.0)
MCH: 27.8 pg (ref 26.0–34.0)
MCHC: 32.4 g/dL (ref 30.0–36.0)
MCV: 85.8 fL (ref 80.0–100.0)
Platelets: 256 10*3/uL (ref 150–400)
RBC: 4.36 MIL/uL (ref 4.22–5.81)
RDW: 14 % (ref 11.5–15.5)
WBC: 5.2 10*3/uL (ref 4.0–10.5)
nRBC: 0 % (ref 0.0–0.2)

## 2019-11-27 LAB — BASIC METABOLIC PANEL
Anion gap: 10 (ref 5–15)
BUN: 17 mg/dL (ref 8–23)
CO2: 22 mmol/L (ref 22–32)
Calcium: 8.8 mg/dL — ABNORMAL LOW (ref 8.9–10.3)
Chloride: 107 mmol/L (ref 98–111)
Creatinine, Ser: 1.38 mg/dL — ABNORMAL HIGH (ref 0.61–1.24)
GFR calc Af Amer: 55 mL/min — ABNORMAL LOW (ref >60)
GFR calc non Af Amer: 47 mL/min — ABNORMAL LOW (ref >60)
Glucose, Bld: 100 mg/dL — ABNORMAL HIGH (ref 70–99)
Potassium: 3.7 mmol/L (ref 3.5–5.1)
Sodium: 139 mmol/L (ref 135–145)

## 2019-11-27 LAB — URINALYSIS, ROUTINE W REFLEX MICROSCOPIC
Bilirubin Urine: NEGATIVE
Glucose, UA: NEGATIVE mg/dL
Hgb urine dipstick: NEGATIVE
Ketones, ur: NEGATIVE mg/dL
Nitrite: POSITIVE — AB
Protein, ur: NEGATIVE mg/dL
Specific Gravity, Urine: 1.003 — ABNORMAL LOW (ref 1.005–1.030)
pH: 6 (ref 5.0–8.0)

## 2019-11-27 LAB — BRAIN NATRIURETIC PEPTIDE: B Natriuretic Peptide: 15.4 pg/mL (ref 0.0–100.0)

## 2019-11-27 LAB — TROPONIN I (HIGH SENSITIVITY): Troponin I (High Sensitivity): 5 ng/L (ref <18)

## 2019-11-27 MED ORDER — ONDANSETRON 4 MG PO TBDP
4.0000 mg | ORAL_TABLET | Freq: Once | ORAL | Status: AC
  Administered 2019-11-27: 4 mg via ORAL
  Filled 2019-11-27: qty 1

## 2019-11-27 MED ORDER — SODIUM CHLORIDE 0.9 % IV SOLN: 1.0000 g | Freq: Once | INTRAVENOUS | Status: DC

## 2019-11-27 MED ORDER — CEPHALEXIN 500 MG PO CAPS
1000.0000 mg | ORAL_CAPSULE | Freq: Once | ORAL | Status: AC
  Administered 2019-11-27: 1000 mg via ORAL
  Filled 2019-11-27: qty 2

## 2019-11-27 MED ORDER — SODIUM CHLORIDE 0.9% FLUSH: 3.0000 mL | Freq: Once | INTRAVENOUS | Status: DC

## 2019-11-27 MED ORDER — CEPHALEXIN 500 MG PO CAPS
500.0000 mg | ORAL_CAPSULE | Freq: Three times a day (TID) | ORAL | 0 refills | Status: AC
Start: 2019-11-27 — End: 2019-12-04

## 2019-11-27 NOTE — Progress Notes (Addendum)
TOC CM spoke to pt and offered choice for [facility]. Pt agreeable to [facility] for  [facility]. States he drives to his appts. Dtr assist him as needed. Has follow up appt with PCP on Thursday. Contacted [facility] rep, Kathlene November with new referral. Isidoro Donning RN CCM, [facility] ED TOC CM [phone]  11/28/2019 1140 am Message received from [facility], they will accept referral. Isidoro Donning RN CCM, [facility] ED TOC CM [phone]

## 2019-11-27 NOTE — ED Notes (Signed)
Lab notified to add-on troponin and BNP for previously submitted labwork.

## 2019-11-27 NOTE — ED Notes (Signed)
Pt verbalizes understanding of DC instructions. Pt belongings returned and is ambulatory out of ED.    Pt states "he doesn't have house keys with him but he can get into house without problem".

## 2019-11-27 NOTE — ED Provider Notes (Addendum)
[facility] Note   CSN: [id number] Arrival date & time: 11/27/19  0844     History Chief Complaint  Patient presents with  . Weakness    Jeremy Merritt is a 85 y.o. male.  HPI  Patient is an 85 year old male presented today for 2 days of generalized fatigue/weakness.  Denies any focal weakness, lightheadedness, dizziness, falls, chest pain, abdominal pain, nausea vomiting, dysuria, frequency, urgency.  He states he is otherwise feeling well but he states he has been eating less.  Chronic malnutrition is an issue for this patient and he is on several medications including dronabinol and Megace to stimulate his appetite.  He does endorse some shortness of breath on exertion however with further inquiry attributes is more that he feels "winded "and exhausted when he walks because of his fatigue.  He denies any diaphoresis, heart palpitations or vision abnormalities.  I discussed patient with his daughter who is able to inform me some of some additional information. On 04/14/24patient found his son died from possible drug overdose and has since then "gone downhill ".  Apparently he has had some increased forgetfulness, left car door open, burns to soup, left a restaurant after ordering food without eating.      Past Medical History:  Diagnosis Date  . Allergy   . Bilateral lower extremity edema 05/04/2019  . Hyperlipidemia   . Hypertension   . Thyroid disease     Patient Active Problem List   Diagnosis Date Noted  . 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 Hypothyroidism 05/22/2019  . Lateral epicondylitis of elbow 05/22/2019  . Bilateral lower extremity edema 05/04/2019  . Pressure injury of skin 04/27/2019  . GI bleed 04/25/2019  . Protein-calorie malnutrition, severe 04/18/2019  . Essential hypertension 04/17/2019  . Hyperlipidemia 04/17/2019  . Loss of weight 04/17/2019  . Mesenteric ischemia (HCC) 04/17/2019  .  Chronic mesenteric ischemia (HCC) 10/17/2018  . History of diverticulitis 10/17/2018  . Chronic abdominal pain 10/17/2018  . Abdominal pain, LLQ 09/21/2018  . Diverticulitis large intestine w/o perforation or abscess w/bleeding 09/21/2018  . Weight loss, non-intentional 09/21/2018  . Upper abdominal pain 06/15/2018  . Early satiety 06/15/2018  . Abdominal fullness 06/15/2018  . Constipation 06/15/2018    Past Surgical History:  Procedure Laterality Date  . COLONOSCOPY WITH PROPOFOL N/A 04/28/2019   Procedure: COLONOSCOPY WITH PROPOFOL;  Surgeon: Toney Reil, MD;  Location: [facility] ENDOSCOPY;  Service: Gastroenterology;  Laterality: N/A;  . ESOPHAGOGASTRODUODENOSCOPY (EGD) WITH PROPOFOL N/A 04/28/2019   Procedure: ESOPHAGOGASTRODUODENOSCOPY (EGD) WITH PROPOFOL;  Surgeon: Toney Reil, MD;  Location: [facility] ENDOSCOPY;  Service: Gastroenterology;  Laterality: N/A;  . INGUINAL HERNIA REPAIR    . VISCERAL ANGIOGRAPHY N/A 04/18/2019   Procedure: VISCERAL ANGIOGRAPHY;  Surgeon: Annice Needy, MD;  Location: [facility] INVASIVE CV LAB;  Service: Cardiovascular;  Laterality: N/A;       Family History  Problem Relation Age of Onset  . Other Mother        died of old age  . Other Father        Black lung  . Thyroid cancer Sister   . Kidney failure Brother        on dialysis  . Colon cancer Neg Hx   . Liver cancer Neg Hx   . Stomach cancer Neg Hx     Social History   Tobacco Use  . Smoking status: Never Smoker  . Smokeless tobacco: Never Used  Vaping Use  . Vaping Use: Never used  Substance Use Topics  . Alcohol use: No  . Drug use: No    Home Medications Prior to Admission medications   Medication Sig Start Date End Date Taking? Authorizing Provider  aspirin EC 81 MG tablet Take 81 mg by mouth daily.   Yes [provider]  calcium carbonate (TUMS) 500 MG chewable tablet Chew 500-1,000 mg by mouth daily as needed (acid reduction).    Yes [provider]   Cholecalciferol (VITAMIN D3) 2000 units TABS Take 2,000 Units by mouth daily.    Yes [provider]  clopidogrel (PLAVIX) 75 MG tablet Take 1 tablet (75 mg total) by mouth daily. 08/29/19  Yes Georgina Quint, MD  levothyroxine (SYNTHROID) 100 MCG tablet Take 1 tablet (100 mcg total) by mouth daily before breakfast. 08/29/19 11/27/19 Yes Sagardia, Eilleen Kempf, MD  megestrol (MEGACE) 40 MG tablet Take 40 mg by mouth daily. 09/22/19  Yes [provider]  simvastatin (ZOCOR) 20 MG tablet Take 1 tablet (20 mg total) by mouth at bedtime. 08/29/19 11/27/19 Yes Sagardia, Eilleen Kempf, MD  cephALEXin (KEFLEX) 500 MG capsule Take 1 capsule (500 mg total) by mouth 3 (three) times daily for 7 days. 11/27/19 12/04/19  Gailen Shelter, PA  dronabinol (MARINOL) 2.5 MG capsule Take 1 capsule (2.5 mg total) by mouth 2 (two) times daily before lunch and supper. Patient not taking: Reported on 10/14/2019 04/29/19   Esaw Grandchild A, DO  omeprazole (PRILOSEC) 40 MG capsule Take 1 capsule (40 mg total) by mouth 2 (two) times daily. Patient not taking: Reported on 11/27/2019 04/29/19   Esaw Grandchild A, DO    Allergies    Patient has no known allergies.  Review of Systems   Review of Systems  Constitutional: Positive for activity change, appetite change, chills and fatigue. Negative for fever.  HENT: Negative for congestion.   Eyes: Negative for pain.  Respiratory: Positive for shortness of breath. Negative for cough.   Cardiovascular: Negative for chest pain and leg swelling.  Gastrointestinal: Negative for abdominal pain, diarrhea, nausea and vomiting.  Genitourinary: Negative for dysuria.  Musculoskeletal: Negative for myalgias.  Skin: Negative for rash.  Neurological: Negative for dizziness, light-headedness, numbness and headaches.    Physical Exam Updated Vital Signs BP (!) 171/69   Pulse 85   Temp 97.9 F (36.6 C) (Oral)   Resp 15   SpO2 100%   Physical Exam Vitals and  nursing note reviewed.  Constitutional:      General: He is not in acute distress. HENT:     Head: Normocephalic and atraumatic.     Nose: Nose normal.     Mouth/Throat:     Mouth: Mucous membranes are moist.  Eyes:     General: No scleral icterus. Cardiovascular:     Rate and Rhythm: Normal rate and regular rhythm.     Pulses: Normal pulses.     Heart sounds: Murmur heard.      Comments: Faint systolic murmur, regular rate and rhythm, no rubs or gallops. Pulmonary:     Effort: Pulmonary effort is normal. No respiratory distress.     Breath sounds: No wheezing.  Abdominal:     Palpations: Abdomen is soft.     Tenderness: There is no abdominal tenderness. There is no right CVA tenderness, left CVA tenderness, guarding or rebound.  Musculoskeletal:     Cervical back: Normal range of motion.     Right lower leg: Edema  present.     Left lower leg: Edema present.     Comments: Trace bilateral pedal edema -- does not extend above the ankle.  Skin:    General: Skin is warm and dry.     Capillary Refill: Capillary refill takes less than 2 seconds.  Neurological:     Mental Status: He is alert. Mental status is at baseline.     Comments: Alert and oriented to self, place, time and event.   Speech is fluent, clear without dysarthria or dysphasia.   Strength 5/5 in upper/lower extremities  Sensation intact in upper/lower extremities   Normal gait--somewhat slow. Negative Romberg. No pronator drift.  Normal finger-to-nose and feet tapping.  CN I not tested  CN II grossly intact visual fields bilaterally. Did not visualize posterior eye.   CN III, IV, VI PERRLA and EOMs intact bilaterally  CN V Intact sensation to sharp and light touch to the face  CN VII facial movements symmetric  CN VIII not tested  CN IX, X no uvula deviation, symmetric rise of soft palate  CN XI 5/5 SCM and trapezius strength bilaterally  CN XII Midline tongue protrusion, symmetric L/R movements    Psychiatric:        Mood and Affect: Mood normal.        Behavior: Behavior normal.     ED Results / Procedures / Treatments   Labs (all labs ordered are listed, but only abnormal results are displayed) Labs Reviewed  BASIC METABOLIC PANEL - Abnormal; Notable for the following components:      Result Value   Glucose, Bld 100 (*)    Creatinine, Ser 1.38 (*)    Calcium 8.8 (*)    GFR calc non Af Amer 47 (*)    GFR calc Af Amer 55 (*)    All other components within normal limits  CBC - Abnormal; Notable for the following components:   Hemoglobin 12.1 (*)    HCT 37.4 (*)    All other components within normal limits  URINALYSIS, ROUTINE W REFLEX MICROSCOPIC - Abnormal; Notable for the following components:   Specific Gravity, Urine 1.003 (*)    Nitrite POSITIVE (*)    Leukocytes,Ua LARGE (*)    Bacteria, UA MANY (*)    All other components within normal limits  URINE CULTURE  BRAIN NATRIURETIC PEPTIDE  CBG MONITORING, ED  CBG MONITORING, ED  TROPONIN I (HIGH SENSITIVITY)  TROPONIN I (HIGH SENSITIVITY)    EKG EKG Interpretation  Date/Time:  Tuesday November 27 2019 09:09:48 EDT Ventricular Rate:  95 PR Interval:    QRS Duration: 121 QT Interval:  373 QTC Calculation: 469 R Axis:   75 Text Interpretation: Sinus rhythm Probable left atrial enlargement Right bundle branch block 12 Lead; Mason-Likar No significant change since last tracing Confirmed by Gwyneth SproutPlunkett, Whitney ([id number]) on 11/27/2019 10:45:25 AM   Radiology DG Chest Portable 1 View  Result Date: 11/27/2019 CLINICAL DATA:  Shortness of breath EXAM: PORTABLE CHEST 1 VIEW COMPARISON:  10/14/2019, 03/16/2019 FINDINGS: Stable cardiomediastinal contours. Atherosclerotic calcification of the aortic knob. Small bilateral pleural effusions. Prominent bilateral interstitial markings. Similar appearance of scattered nodularity throughout the lungs, most evident at the right lung apex, as seen on previous CT 03/16/2019. No  pneumothorax. IMPRESSION: 1. Mild diffuse interstitial prominence with small bilateral pleural effusions. Query mild edema. 2. Similar appearance of scattered nodularity throughout the lungs, most evident at the right lung apex. Electronically Signed   By: Duanne GuessNicholas  Plundo D.O.  On: 11/27/2019 11:19    Procedures Procedures (including critical care time)  Medications Ordered in ED Medications  cephALEXin (KEFLEX) capsule 1,000 mg (has no administration in time range)  ondansetron (ZOFRAN-ODT) disintegrating tablet 4 mg (has no administration in time range)    ED Course  I have reviewed the triage vital signs and the nursing notes.  Pertinent labs & imaging results that were available during my care of the patient were reviewed by me and considered in my medical decision making (see chart for details).  Patient is well-appearing 85 year old male who is presented today for complaints of weakness and fatigue  Physical exam is relatively unremarkable. He has no abdominal tenderness, CVA tenderness, does not appear acutely dehydrated.  He does have a faint systolic murmur.  He is not been told of this before he is not appear fluid overloaded however very trace pedal edema no JVD.  The differential diagnosis of weakness includes but is not limited to neurologic causes (GBS, myasthenia gravis, CVA, MS, ALS, transverse myelitis, spinal cord injury, CVA, botulism, ) and other causes: ACS, Arrhythmia, syncope, orthostatic hypotension, sepsis, hypoglycemia, electrolyte disturbance, hypothyroidism, respiratory failure, symptomatic anemia, dehydration, heat injury, polypharmacy, malignancy.  Given this patient's age and history is concerning for UTI VS anemia VS kidney function decline, VS malnutrition VS advanced age VS depression.  Clinical Course as of Nov 26 1221  Tue Nov 27, 2019  1111 CBC without leukocytosis  CBC(!) [WF]  1150 Urinalysis consistent with acute UTI with nitrite positive  leukocyte positive and many bacteria.  Discussed this with my attending physician.  Plan is to treat with oral Keflex and discharge with same.  Urinalysis, Routine w reflex microscopic(!) [WF]  1212 BMP with stable creatinine elevation.  No significant electrolyte abnormalities.  Basic metabolic panel(!) [WF]  5093 Troponin within normal limits.  No indication for a repeat.  This effectively evaluates whether the patient's shortness of breath is related to cardiac damage in the acute setting.  Given the patient's longstanding pulmonary fibrosis I suspect this is more of the cause of his shortness of breath as well as his weakness from the UTI.  Troponin I (High Sensitivity) [WF]  1213 BNP within normal limits.  Brain natriuretic peptide [WF]  1214 Chest x-ray reviewed by myself and my attending physician.  We reviewed prior x-rays and CT scans the patient has had in the past he appears to have longstanding nodular lung and the bilateral pleural effusions are not new.   DG Chest Portable 1 View [WF]  1215 No acute changes.  Patient has had prior bundle-branch blocks in the past.  No chest pain currently.  No evidence of acute ischemia.  EKG 12-Lead [WF]  1216 Blood pressure is somewhat elevated however he has no symptoms of hypertensive emergency.  BP(!): 171/69 [WF]    Clinical Course User Index [WF] Tedd Sias, PA   MDM Rules/Calculators/A&P                          Patient set up with home health nurse, home health aide, PT.  I have also given patient ambulatory referral to dietitian.  Will follow up with his primary care doctor on Thursday.  I discussed this case with my attending physician who cosigned this note including patient's presenting symptoms, physical exam, and planned diagnostics and interventions. Attending physician stated agreement with plan or made changes to plan which were implemented.   Attending physician assessed patient at  bedside.  The medical records were  personally reviewed by myself. I personally reviewed all lab results and interpreted all imaging studies and either concurred with their official read or contacted radiology for clarification. Additional history obtained from old records and family members.  This patient appears reasonably screened and I doubt any other medical condition requiring further workup, evaluation, or treatment in the ED at this time prior to discharge.   Patient's vitals are WNL apart from vital sign abnormalities discussed above, patient is in NAD, and  able to ambulate in the ED at their baseline and able to tolerate PO.  Pain has been managed or a plan has been made for home management and has no complaints prior to discharge. Patient is comfortable with above plan and for discharge at this time. All questions were answered prior to disposition. Results from the ER workup discussed with the patient face to face and all questions answered to the best of my ability. The patient is safe for discharge with strict return precautions. Patient appears safe for discharge with appropriate follow-up. Conveyed my impression with the patient and they voiced understanding and are agreeable to plan.   An After Visit Summary was printed and given to the patient.  Portions of this note were generated with Scientist, clinical (histocompatibility and immunogenetics). Dictation errors may occur despite best attempts at proofreading.    Patient started on Keflex.  Will follow up with primary care doctor for urine culture review and follow-up on Thursday.  He will also likely benefit from a home health which was ordered for him.  Final Clinical Impression(s) / ED Diagnoses Final diagnoses:  Acute cystitis without hematuria    Rx / DC Orders ED Discharge Orders         Ordered    Home Health     Discontinue  Reprint     11/27/19 1209    Face-to-face encounter (required for Medicare/Medicaid patients)     Discontinue  Reprint    Comments: I Gailen Shelter certify  that this patient is under my care and that I, or a nurse practitioner or physician's assistant working with me, had a face-to-face encounter that meets the physician face-to-face encounter requirements with this patient on 11/27/2019. The encounter with the patient was in whole, or in part for the following medical condition(s) which is the primary reason for home health care (List medical condition): Chronic fatigue, advanced age, on multiple medications, chronic malnutrition.   11/27/19 1209    Amb ref to Medical Nutrition Therapy-MNT     Discontinue  Reprint     11/27/19 1209    cephALEXin (KEFLEX) 500 MG capsule  3 times daily     Discontinue  Reprint     11/27/19 1209           Gailen Shelter, [place] 11/27/19 1239    [place], [place] 11/27/19 1250    Gwyneth Sprout, MD 11/28/19 1028

## 2019-11-27 NOTE — ED Triage Notes (Addendum)
Patient here from home reporting sudden onset of weakness that started yesterday increased today. Sudden onset of SOB and difficulty walking. Reports that he has been also "forgetting things", but was able to drive self here.

## 2019-11-27 NOTE — ED Notes (Signed)
Urinal at bedside.  

## 2019-11-27 NOTE — Discharge Instructions (Addendum)
Please take antibiotics as prescribed.  Please keep your appointment with your primary care doctor.  I recommend discussing your urinalysis results and following up on your urine culture.  Please monitor your symptoms and discuss any changes with your doctor.  I also noted a very faint murmur on your cardiac exam which can be a normal finding.  Your blood work and x-ray were reassuring however you do have evidence of some pulmonary fibrosis or potentially some other abnormalities which have been present for quite some time-which was shown on prior CT scans.  I do recommend touching base with your doctor about this as well as it may be contributing to some shortness of breath that you are experiencing.

## 2019-11-29 ENCOUNTER — Encounter (HOSPITAL_COMMUNITY): Payer: Self-pay

## 2019-11-29 ENCOUNTER — Telehealth: Payer: Self-pay | Admitting: Emergency Medicine

## 2019-11-29 ENCOUNTER — Emergency Department (HOSPITAL_COMMUNITY)
Admission: EM | Admit: 2019-11-29 | Discharge: 2019-11-29 | Disposition: A | Discharge status: Home / Self Care | Payer: No Typology Code available for payment source | Attending: Emergency Medicine | Admitting: Emergency Medicine

## 2019-11-29 ENCOUNTER — Ambulatory Visit: Payer: Medicare PPO | Admitting: Emergency Medicine

## 2019-11-29 ENCOUNTER — Other Ambulatory Visit: Payer: Self-pay

## 2019-11-29 ENCOUNTER — Encounter: Payer: Self-pay | Admitting: Emergency Medicine

## 2019-11-29 VITALS — BP 176/74 | HR 96 | Temp 98.1°F | Resp 16 | Ht 67.5 in | Wt 120.0 lb

## 2019-11-29 DIAGNOSIS — K551 Chronic vascular disorders of intestine: Secondary | ICD-10-CM | POA: Diagnosis not present

## 2019-11-29 DIAGNOSIS — Z79899 Other long term (current) drug therapy: Secondary | ICD-10-CM | POA: Diagnosis not present

## 2019-11-29 DIAGNOSIS — Z7982 Long term (current) use of aspirin: Secondary | ICD-10-CM | POA: Diagnosis not present

## 2019-11-29 DIAGNOSIS — N3090 Cystitis, unspecified without hematuria: Secondary | ICD-10-CM

## 2019-11-29 DIAGNOSIS — N3091 Cystitis, unspecified with hematuria: Secondary | ICD-10-CM | POA: Diagnosis not present

## 2019-11-29 DIAGNOSIS — R319 Hematuria, unspecified: Secondary | ICD-10-CM | POA: Diagnosis present

## 2019-11-29 DIAGNOSIS — E039 Hypothyroidism, unspecified: Secondary | ICD-10-CM | POA: Insufficient documentation

## 2019-11-29 DIAGNOSIS — N4889 Other specified disorders of penis: Secondary | ICD-10-CM | POA: Diagnosis not present

## 2019-11-29 DIAGNOSIS — I10 Essential (primary) hypertension: Secondary | ICD-10-CM

## 2019-11-29 DIAGNOSIS — Z79890 Hormone replacement therapy (postmenopausal): Secondary | ICD-10-CM | POA: Diagnosis not present

## 2019-11-29 DIAGNOSIS — K5730 Diverticulosis of large intestine without perforation or abscess without bleeding: Secondary | ICD-10-CM

## 2019-11-29 DIAGNOSIS — E785 Hyperlipidemia, unspecified: Secondary | ICD-10-CM | POA: Diagnosis not present

## 2019-11-29 LAB — CBC WITH DIFFERENTIAL/PLATELET
Abs Immature Granulocytes: 0.03 10*3/uL (ref 0.00–0.07)
Basophils Absolute: 0 10*3/uL (ref 0.0–0.1)
Basophils Relative: 1 %
Eosinophils Absolute: 0.1 10*3/uL (ref 0.0–0.5)
Eosinophils Relative: 1 %
HCT: 32.6 % — ABNORMAL LOW (ref 39.0–52.0)
Hemoglobin: 10.6 g/dL — ABNORMAL LOW (ref 13.0–17.0)
Immature Granulocytes: 1 %
Lymphocytes Relative: 21 %
Lymphs Abs: 0.9 10*3/uL (ref 0.7–4.0)
MCH: 27.5 pg (ref 26.0–34.0)
MCHC: 32.5 g/dL (ref 30.0–36.0)
MCV: 84.7 fL (ref 80.0–100.0)
Monocytes Absolute: 0.5 10*3/uL (ref 0.1–1.0)
Monocytes Relative: 12 %
Neutro Abs: 2.7 10*3/uL (ref 1.7–7.7)
Neutrophils Relative %: 64 %
Platelets: 239 10*3/uL (ref 150–400)
RBC: 3.85 MIL/uL — ABNORMAL LOW (ref 4.22–5.81)
RDW: 13.9 % (ref 11.5–15.5)
WBC: 4.2 10*3/uL (ref 4.0–10.5)
nRBC: 0 % (ref 0.0–0.2)

## 2019-11-29 LAB — BASIC METABOLIC PANEL
Anion gap: 8 (ref 5–15)
BUN: 22 mg/dL (ref 8–23)
CO2: 23 mmol/L (ref 22–32)
Calcium: 8.5 mg/dL — ABNORMAL LOW (ref 8.9–10.3)
Chloride: 112 mmol/L — ABNORMAL HIGH (ref 98–111)
Creatinine, Ser: 1.51 mg/dL — ABNORMAL HIGH (ref 0.61–1.24)
GFR calc Af Amer: 49 mL/min — ABNORMAL LOW (ref >60)
GFR calc non Af Amer: 42 mL/min — ABNORMAL LOW (ref >60)
Glucose, Bld: 124 mg/dL — ABNORMAL HIGH (ref 70–99)
Potassium: 3.7 mmol/L (ref 3.5–5.1)
Sodium: 143 mmol/L (ref 135–145)

## 2019-11-29 LAB — URINALYSIS, ROUTINE W REFLEX MICROSCOPIC
Bilirubin Urine: NEGATIVE
Glucose, UA: NEGATIVE mg/dL
Hgb urine dipstick: NEGATIVE
Ketones, ur: NEGATIVE mg/dL
Nitrite: NEGATIVE
Protein, ur: NEGATIVE mg/dL
Specific Gravity, Urine: 1.017 (ref 1.005–1.030)
WBC, UA: >50 WBC/hpf — ABNORMAL HIGH (ref 0–5)
pH: 5 (ref 5.0–8.0)

## 2019-11-29 LAB — POCT URINALYSIS DIP (MANUAL ENTRY)
Bilirubin, UA: NEGATIVE
Blood, UA: NEGATIVE
Glucose, UA: NEGATIVE mg/dL
Ketones, POC UA: NEGATIVE mg/dL
Nitrite, UA: POSITIVE — AB
Protein Ur, POC: NEGATIVE mg/dL
Spec Grav, UA: 1.025 (ref 1.010–1.025)
Urobilinogen, UA: 0.2 E.U./dL
pH, UA: 6 (ref 5.0–8.0)

## 2019-11-29 LAB — URINE CULTURE: Culture: >=100000 — AB

## 2019-11-29 LAB — CK: Total CK: 58 U/L (ref 49–397)

## 2019-11-29 MED ORDER — AMOXICILLIN-POT CLAVULANATE 875-125 MG PO TABS
1.0000 | ORAL_TABLET | Freq: Two times a day (BID) | ORAL | 0 refills | Status: DC
Start: 2019-11-29 — End: 2020-02-20

## 2019-11-29 MED ORDER — SODIUM CHLORIDE 0.9 % IV SOLN: 1.0000 g | Freq: Once | INTRAVENOUS | Status: DC

## 2019-11-29 MED ORDER — PIPERACILLIN-TAZOBACTAM 3.375 G IVPB 30 MIN
3.3750 g | Freq: Once | INTRAVENOUS | Status: AC
  Administered 2019-11-29: 3.375 g via INTRAVENOUS
  Filled 2019-11-29: qty 50

## 2019-11-29 MED ORDER — SODIUM CHLORIDE 0.9 % IV BOLUS
1000.0000 mL | Freq: Once | INTRAVENOUS | Status: AC
  Administered 2019-11-29: 1000 mL via INTRAVENOUS

## 2019-11-29 NOTE — Telephone Encounter (Signed)
Spoke with someone in the after hour answering service to relay the message to the correct person regarding the signing of the Home Health request and I also advised them that I have sent over today's office notes as requested.

## 2019-11-29 NOTE — Progress Notes (Signed)
Jeremy Merritt 85 y.o.   Chief Complaint  Patient presents with  . medication review    per patient not sure   . penial problem     patient said it started Monday after taking Cephalexin 250 or 500 (not sure), his penis was bleeding and he stop taking the pill and it stopped    HISTORY OF PRESENT ILLNESS: This is a 85 y.o. male Jeremy Merritt patient with multiple chronic medical problems here for follow-up medication review. Had been taking cephalexin for unknown reason, developed painful urination and blood in the urine, stopped antibiotic and symptoms got better.  Asymptomatic at present time. Has no other complaints or medical concerns today Medical record reviewed and problem list assessed. All medications reviewed with patient. Has history of chronic mesenteric ischemia, hypertension, dyslipidemia, diverticulosis, and thyroid disease.  On Plavix and aspirin among other medications.  HPI   Prior to Admission medications   Medication Sig Start Date End Date Taking? Authorizing Provider  aspirin EC 81 MG tablet Take 81 mg by mouth daily.   Yes [provider]  Cholecalciferol (VITAMIN D3) 2000 units TABS Take 2,000 Units by mouth daily.    Yes [provider]  dronabinol (MARINOL) 2.5 MG capsule Take 1 capsule (2.5 mg total) by mouth 2 (two) times daily before lunch and supper. 04/29/19  Yes Nicole Kindred A, DO  lisinopril-hydrochlorothiazide (ZESTORETIC) 20-12.5 MG tablet Take 1 tablet by mouth daily.   Yes [provider]  megestrol (MEGACE) 40 MG tablet Take 40 mg by mouth daily. 09/22/19  Yes [provider]  omeprazole (PRILOSEC) 40 MG capsule Take 1 capsule (40 mg total) by mouth 2 (two) times daily. 04/29/19  Yes Nicole Kindred A, DO  calcium carbonate (TUMS) 500 MG chewable tablet Chew 500-1,000 mg by mouth daily as needed (acid reduction).  Patient not taking: Reported on 11/29/2019    [provider]  cephALEXin (KEFLEX) 500 MG  capsule Take 1 capsule (500 mg total) by mouth 3 (three) times daily for 7 days. Patient not taking: Reported on 11/29/2019 11/27/19 12/04/19  Tedd Sias, PA  clopidogrel (PLAVIX) 75 MG tablet Take 1 tablet (75 mg total) by mouth daily. Patient not taking: Reported on 11/29/2019 08/29/19   Horald Pollen, MD  levothyroxine (SYNTHROID) 100 MCG tablet Take 1 tablet (100 mcg total) by mouth daily before breakfast. 08/29/19 11/27/19  Horald Pollen, MD  simvastatin (ZOCOR) 20 MG tablet Take 1 tablet (20 mg total) by mouth at bedtime. 08/29/19 11/27/19  Horald Pollen, MD    No Known Allergies  Patient Active Problem List   Diagnosis Date Noted  . 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 Hypothyroidism 05/22/2019  . Bilateral lower extremity edema 05/04/2019  . Essential hypertension 04/17/2019  . Hyperlipidemia 04/17/2019  . Mesenteric ischemia ([place]) 04/17/2019  . Chronic mesenteric ischemia ([place]) 10/17/2018  . Chronic abdominal pain 10/17/2018    Past Medical History:  Diagnosis Date  . Allergy   . Bilateral lower extremity edema 05/04/2019  . Hyperlipidemia   . Hypertension   . Thyroid disease     Past Surgical History:  Procedure Laterality Date  . COLONOSCOPY WITH PROPOFOL N/A 04/28/2019   Procedure: COLONOSCOPY WITH PROPOFOL;  Surgeon: Lin Landsman, MD;  Location: [facility] ENDOSCOPY;  Service: Gastroenterology;  Laterality: N/A;  . ESOPHAGOGASTRODUODENOSCOPY (EGD) WITH PROPOFOL N/A 04/28/2019   Procedure: ESOPHAGOGASTRODUODENOSCOPY (EGD) WITH PROPOFOL;  Surgeon: Lin Landsman, MD;  Location: [facility] ENDOSCOPY;  Service: Gastroenterology;  Laterality: N/A;  . INGUINAL HERNIA REPAIR    . VISCERAL ANGIOGRAPHY N/A 04/18/2019   Procedure: VISCERAL ANGIOGRAPHY;  Surgeon: Annice Needy, MD;  Location: [facility] INVASIVE CV LAB;  Service: Cardiovascular;  Laterality: N/A;    Social History   Socioeconomic History  . Marital status: Widowed    Spouse name:  Not on file  . Number of children: 2  . Years of education: Not on file  . Highest education level: Not on file  Occupational History  . Occupation: retired  Tobacco Use  . Smoking status: Never Smoker  . Smokeless tobacco: Never Used  Vaping Use  . Vaping Use: Never used  Substance and Sexual Activity  . Alcohol use: No  . Drug use: No  . Sexual activity: Never  Other Topics Concern  . Not on file  Social History Narrative  . Not on file   Social Determinants of Health   Financial Resource Strain:   . Difficulty of Paying Living Expenses:   Food Insecurity:   . Worried About Programme researcher, broadcasting/film/video in the Last Year:   . Barista in the Last Year:   Transportation Needs:   . Freight forwarder (Medical):   Marland Kitchen Lack of Transportation (Non-Medical):   Physical Activity:   . Days of Exercise per Week:   . Minutes of Exercise per Session:   Stress:   . Feeling of Stress :   Social Connections:   . Frequency of Communication with Friends and Family:   . Frequency of Social Gatherings with Friends and Family:   . Attends Religious Services:   . Active Member of Clubs or Organizations:   . Attends Banker Meetings:   Marland Kitchen Marital Status:   Intimate Partner Violence:   . Fear of Current or Ex-Partner:   . Emotionally Abused:   Marland Kitchen Physically Abused:   . Sexually Abused:     Family History  Problem Relation Age of Onset  . Other Mother        died of old age  . Other Father        Black lung  . Thyroid cancer Sister   . Kidney failure Brother        on dialysis  . Colon cancer Neg Hx   . Liver cancer Neg Hx   . Stomach cancer Neg Hx      Review of Systems  Constitutional: Negative.  Negative for chills and fever.  HENT: Negative.  Negative for congestion and sore throat.   Respiratory: Negative.  Negative for cough and shortness of breath.   Cardiovascular: Negative.  Negative for chest pain and palpitations.  Gastrointestinal: Negative.   Negative for abdominal pain, blood in stool, diarrhea, melena, nausea and vomiting.  Genitourinary: Negative.  Negative for dysuria and hematuria.  Musculoskeletal: Negative.  Negative for myalgias and neck pain.  Skin: Negative.  Negative for rash.  Neurological: Negative.  Negative for dizziness and headaches.  All other systems reviewed and are negative.  Today's Vitals   11/29/19 1011  BP: (!) 176/74  Pulse: 96  Resp: 16  Temp: 98.1 F (36.7 C)  TempSrc: Temporal  SpO2: 99%  Weight: 120 lb (54.4 kg)  Height: 5' 7.5" (1.715 m)   Body mass index is 18.52 kg/m.   Physical Exam Vitals reviewed.  Constitutional:      Appearance: Normal appearance.  HENT:     Head: Normocephalic.  Eyes:     Extraocular Movements: Extraocular  movements intact.     Conjunctiva/sclera: Conjunctivae normal.     Pupils: Pupils are equal, round, and reactive to light.  Cardiovascular:     Rate and Rhythm: Normal rate and regular rhythm.     Pulses: Normal pulses.     Heart sounds: Normal heart sounds.  Pulmonary:     Effort: Pulmonary effort is normal.     Breath sounds: Normal breath sounds.  Abdominal:     General: There is no distension.     Palpations: Abdomen is soft.     Tenderness: There is no abdominal tenderness.  Musculoskeletal:        General: Normal range of motion.     Cervical back: Normal range of motion and neck supple.  Skin:    General: Skin is warm and dry.     Capillary Refill: Capillary refill takes less than 2 seconds.  Neurological:     General: No focal deficit present.     Mental Status: He is alert and oriented to person, place, and time.  Psychiatric:        Mood and Affect: Mood normal.        Behavior: Behavior normal.    Results for orders placed or performed in visit on 11/29/19 (from the past 24 hour(s))  POCT urinalysis dipstick     Status: Abnormal   Collection Time: 11/29/19 10:33 AM  Result Value Ref Range   Color, UA yellow yellow   Clarity,  UA clear clear   Glucose, UA negative negative mg/dL   Bilirubin, UA negative negative   Ketones, POC UA negative negative mg/dL   Spec Grav, UA [id number] - 1.025   Blood, UA negative negative   pH, UA 6.0 5.0 - 8.0   Protein Ur, POC negative negative mg/dL   Urobilinogen, UA 0.2 0.2 or 1.0 E.U./dL   Nitrite, UA Positive (A) Negative   Leukocytes, UA Small (1+) (A) Negative   A total of 30 minutes was spent with the patient, greater than 50% of which was in counseling/coordination of care regarding multiple chronic medical conditions, review of all medications, review of most recent office visit notes, review of urinalysis, diet and nutrition, prognosis and need for follow-up. Urinalysis positive for nitrates and leukocytes.  Patient developed reaction to cephalexin.  Asymptomatic at present time.  Possible partially treated UTI.  Will wait on culture for future treatment.  ASSESSMENT & PLAN: Jeremy Merritt was seen today for medication review and penial problem.  Diagnoses and all orders for this visit:  Chronic mesenteric ischemia (HCC)  Penile pain -     POCT urinalysis dipstick -     Urine Culture  Hypothyroidism, unspecified type  Dyslipidemia  Essential hypertension  Diverticular disease of colon    Patient Instructions   Stop cephalexin. Continue all other medications.  No changes. [facility]. Follow-up here as needed.    If you have lab work done today you will be contacted with your lab results within the next 2 weeks.  If you have not heard from us then please contact us. The fastest way to get your results is to register for My Chart.   IF you received an x-ray today, you will receive an invoice from [facility]. Please contact [facility] at [phone] with questions or concerns regarding your invoice.   IF you received labwork today, you will receive an invoice from [facility]. Please contact [facility] at  [phone] with questions or concerns regarding your invoice.  Our billing staff will not be able to assist you with questions regarding bills from these companies.  You will be contacted with the lab results as soon as they are available. The fastest way to get your results is to activate your My Chart account. Instructions are located on the last page of this paperwork. If you have not heard from [place] regarding the results in 2 weeks, please contact this office.     Health Maintenance After Age 54 After age 55, you are at a higher risk for certain long-term diseases and infections as well as injuries from falls. Falls are a major cause of broken bones and head injuries in people who are older than age 38. Getting regular preventive care can help to keep you healthy and well. Preventive care includes getting regular testing and making lifestyle changes as recommended by your health care provider. Talk with your health care provider about:  Which screenings and tests you should have. A screening is a test that checks for a disease when you have no symptoms.  A diet and exercise plan that is right for you. What should I know about screenings and tests to prevent falls? Screening and testing are the best ways to find a health problem early. Early diagnosis and treatment give you the best chance of managing medical conditions that are common after age 32. Certain conditions and lifestyle choices may make you more likely to have a fall. Your health care provider may recommend:  Regular vision checks. Poor vision and conditions such as cataracts can make you more likely to have a fall. If you wear glasses, make sure to get your prescription updated if your vision changes.  Medicine review. Work with your health care provider to regularly review all of the medicines you are taking, including over-the-counter medicines. Ask your health care provider about any side effects that may make you more likely  to have a fall. Tell your health care provider if any medicines that you take make you feel dizzy or sleepy.  Osteoporosis screening. Osteoporosis is a condition that causes the bones to get weaker. This can make the bones weak and cause them to break more easily.  Blood pressure screening. Blood pressure changes and medicines to control blood pressure can make you feel dizzy.  Strength and balance checks. Your health care provider may recommend certain tests to check your strength and balance while standing, walking, or changing positions.  Foot health exam. Foot pain and numbness, as well as not wearing proper footwear, can make you more likely to have a fall.  Depression screening. You may be more likely to have a fall if you have a fear of falling, feel emotionally low, or feel unable to do activities that you used to do.  Alcohol use screening. Using too much alcohol can affect your balance and may make you more likely to have a fall. What actions can I take to lower my risk of falls? General instructions  Talk with your health care provider about your risks for falling. Tell your health care provider if: ? You fall. Be sure to tell your health care provider about all falls, even ones that seem minor. ? You feel dizzy, sleepy, or off-balance.  Take over-the-counter and prescription medicines only as told by your health care provider. These include any supplements.  Eat a healthy diet and maintain a healthy weight. A healthy diet includes low-fat dairy products, low-fat (lean) meats, and fiber from whole grains, beans, and  lots of fruits and vegetables. Home safety  Remove any tripping hazards, such as rugs, cords, and clutter.  Install safety equipment such as grab bars in bathrooms and safety rails on stairs.  Keep rooms and walkways well-lit. Activity   Follow a regular exercise program to stay fit. This will help you maintain your balance. Ask your health care provider what  types of exercise are appropriate for you.  If you need a cane or walker, use it as recommended by your health care provider.  Wear supportive shoes that have nonskid soles. Lifestyle  Do not drink alcohol if your health care provider tells you not to drink.  If you drink alcohol, limit how much you have: ? 0-1 drink a day for women. ? 0-2 drinks a day for men.  Be aware of how much alcohol is in your drink. In the [place] one drink equals one typical bottle of beer (12 oz), one-half glass of wine (5 oz), or one shot of hard liquor (1 oz).  Do not use any products that contain nicotine or tobacco, such as cigarettes and e-cigarettes. If you need help quitting, ask your health care provider. Summary  Having a healthy lifestyle and getting preventive care can help to protect your health and wellness after age 77.  Screening and testing are the best way to find a health problem early and help you avoid having a fall. Early diagnosis and treatment give you the best chance for managing medical conditions that are more common for people who are older than age 53.  Falls are a major cause of broken bones and head injuries in people who are older than age 45. Take precautions to prevent a fall at home.  Work with your health care provider to learn what changes you can make to improve your health and wellness and to prevent falls. This information is not intended to replace advice given to you by your health care provider. Make sure you discuss any questions you have with your health care provider. Document Revised: 09/14/2018 Document Reviewed: 04/06/2017 Elsevier Patient Education  2020 Elsevier Inc.      Edwina Barth, MD [facility]

## 2019-11-29 NOTE — ED Provider Notes (Addendum)
[facility]-EMERGENCY DEPT Provider Note   CSN: [id number] Arrival date & time: 11/29/19  1911     History Chief Complaint  Patient presents with  . Hematuria    Jeremy Merritt is a 85 y.o. male hx of HL, HTN, here with hematuria.  Patient was seen here recently and diagnosed with UTI.  Patient took a dose of Keflex and noticed hematuria.  He states that he stopped taking Keflex and hematuria resolved.  Denies any dysuria or abdominal pain or vomiting.  Sent here by primary care doctor to recheck his urine and repeat labs  The history is provided by the patient.       Past Medical History:  Diagnosis Date  . Allergy   . Bilateral lower extremity edema 05/04/2019  . Hyperlipidemia   . Hypertension   . Thyroid disease     Patient Active Problem List   Diagnosis Date Noted  . 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 Hypothyroidism 05/22/2019  . Bilateral lower extremity edema 05/04/2019  . Essential hypertension 04/17/2019  . Dyslipidemia 04/17/2019  . Mesenteric ischemia (HCC) 04/17/2019  . Chronic mesenteric ischemia (HCC) 10/17/2018  . Chronic abdominal pain 10/17/2018    Past Surgical History:  Procedure Laterality Date  . COLONOSCOPY WITH PROPOFOL N/A 04/28/2019   Procedure: COLONOSCOPY WITH PROPOFOL;  Surgeon: Toney Reil, MD;  Location: [facility] ENDOSCOPY;  Service: Gastroenterology;  Laterality: N/A;  . ESOPHAGOGASTRODUODENOSCOPY (EGD) WITH PROPOFOL N/A 04/28/2019   Procedure: ESOPHAGOGASTRODUODENOSCOPY (EGD) WITH PROPOFOL;  Surgeon: Toney Reil, MD;  Location: [facility] ENDOSCOPY;  Service: Gastroenterology;  Laterality: N/A;  . INGUINAL HERNIA REPAIR    . VISCERAL ANGIOGRAPHY N/A 04/18/2019   Procedure: VISCERAL ANGIOGRAPHY;  Surgeon: Annice Needy, MD;  Location: [facility] INVASIVE CV LAB;  Service: Cardiovascular;  Laterality: N/A;       Family History  Problem Relation Age of Onset  . Other Mother        died of old age   . Other Father        Black lung  . Thyroid cancer Sister   . Kidney failure Brother        on dialysis  . Colon cancer Neg Hx   . Liver cancer Neg Hx   . Stomach cancer Neg Hx     Social History   Tobacco Use  . Smoking status: Never Smoker  . Smokeless tobacco: Never Used  Vaping Use  . Vaping Use: Never used  Substance Use Topics  . Alcohol use: No  . Drug use: No    Home Medications Prior to Admission medications   Medication Sig Start Date End Date Taking? Authorizing Provider  aspirin EC 81 MG tablet Take 81 mg by mouth daily.   Yes [provider]  Cholecalciferol (VITAMIN D3) 2000 units TABS Take 2,000 Units by mouth daily.    Yes [provider]  levothyroxine (SYNTHROID) 100 MCG tablet Take 1 tablet (100 mcg total) by mouth daily before breakfast. 08/29/19 11/29/19 Yes Sagardia, Eilleen Kempf, MD  lisinopril-hydrochlorothiazide (ZESTORETIC) 20-12.5 MG tablet Take 1 tablet by mouth daily.   Yes [provider]  megestrol (MEGACE) 40 MG tablet Take 40 mg by mouth daily. 09/22/19  Yes [provider]  simvastatin (ZOCOR) 20 MG tablet Take 1 tablet (20 mg total) by mouth at bedtime. 08/29/19 11/29/19 Yes Sagardia, Eilleen Kempf, MD  cephALEXin (KEFLEX) 500 MG capsule Take 1 capsule (500 mg total) by mouth 3 (three) times daily for  7 days. Patient not taking: Reported on 11/29/2019 11/27/19 12/04/19  Tedd Sias, PA  clopidogrel (PLAVIX) 75 MG tablet Take 1 tablet (75 mg total) by mouth daily. Patient not taking: Reported on 11/29/2019 08/29/19   Horald Pollen, MD  dronabinol (MARINOL) 2.5 MG capsule Take 1 capsule (2.5 mg total) by mouth 2 (two) times daily before lunch and supper. Patient not taking: Reported on 11/29/2019 04/29/19   Nicole Kindred A, DO  omeprazole (PRILOSEC) 40 MG capsule Take 1 capsule (40 mg total) by mouth 2 (two) times daily. Patient not taking: Reported on 11/29/2019 04/29/19   Nicole Kindred A, DO     Allergies    Patient has no known allergies.  Review of Systems   Review of Systems  Genitourinary: Positive for hematuria.  All other systems reviewed and are negative.   Physical Exam Updated Vital Signs BP (!) 181/82   Pulse (!) 112   Temp 98.2 F (36.8 C)   Resp 16   Ht 5' 7.75" (1.721 m)   Wt 55 kg   SpO2 100%   BMI 18.57 kg/m   Physical Exam Vitals and nursing note reviewed.  Constitutional:      Appearance: Normal appearance.  HENT:     Head: Normocephalic.     Nose: Nose normal.     Mouth/Throat:     Mouth: Mucous membranes are moist.  Eyes:     Extraocular Movements: Extraocular movements intact.     Pupils: Pupils are equal, round, and reactive to light.  Cardiovascular:     Rate and Rhythm: Normal rate and regular rhythm.     Pulses: Normal pulses.     Heart sounds: Normal heart sounds.  Pulmonary:     Effort: Pulmonary effort is normal.     Breath sounds: Normal breath sounds.  Abdominal:     General: Abdomen is flat.     Palpations: Abdomen is soft.  Musculoskeletal:        General: Normal range of motion.     Cervical back: Normal range of motion.  Skin:    General: Skin is warm.     Capillary Refill: Capillary refill takes less than 2 seconds.  Neurological:     General: No focal deficit present.     Mental Status: He is alert and oriented to person, place, and time.  Psychiatric:        Mood and Affect: Mood normal.        Behavior: Behavior normal.     ED Results / Procedures / Treatments   Labs (all labs ordered are listed, but only abnormal results are displayed) Labs Reviewed  URINALYSIS, ROUTINE W REFLEX MICROSCOPIC - Abnormal; Notable for the following components:      Result Value   Leukocytes,Ua MODERATE (*)    WBC, UA >50 (*)    Bacteria, UA MANY (*)    All other components within normal limits  URINE CULTURE  CBC WITH DIFFERENTIAL/PLATELET  BASIC METABOLIC PANEL  CK    EKG None  Radiology No results  found.  Procedures Procedures (including critical care time)  Medications Ordered in ED Medications  sodium chloride 0.9 % bolus 1,000 mL (has no administration in time range)  piperacillin-tazobactam (ZOSYN) IVPB 3.375 g (has no administration in time range)    ED Course  I have reviewed the triage vital signs and the nursing notes.  Pertinent labs & imaging results that were available during my care of the patient were reviewed by  me and considered in my medical decision making (see chart for details).    MDM Rules/Calculators/A&P                          Jeremy Merritt is a 85 y.o. male who presented with hematuria.  He states that he had hematuria after he took Keflex.  His urine culture came back and he has E. coli with pan sensitivity.  His CBC today is unremarkable.  His urine today showed no hematuria and his CK level is normal.  His urinalysis does show UTI still.  He was given a dose of Zosyn in the ED.  Since he is not septic and urine culture sensitive to augmentin, will switch his antibiotic to Augmentin.  Final Clinical Impression(s) / ED Diagnoses Final diagnoses:  None    Rx / DC Orders ED Discharge Orders    None       Charlynne Pander, MD 11/29/19 2251    Charlynne Pander, MD 11/29/19 2252

## 2019-11-29 NOTE — Patient Instructions (Addendum)
Stop cephalexin. Continue all other medications.  No changes. Follow-up with [facility] hospital as scheduled. Follow-up here as needed.    If you have lab work done today you will be contacted with your lab results within the next 2 weeks.  If you have not heard from [place] then please contact us. The fastest way to get your results is to register for My Chart.   IF you received an x-ray today, you will receive an invoice from [facility]. Please contact [facility] at [phone] with questions or concerns regarding your invoice.   IF you received labwork today, you will receive an invoice from [place]. Please contact [facility] at [phone] with questions or concerns regarding your invoice.   Our billing staff will not be able to assist you with questions regarding bills from these companies.  You will be contacted with the lab results as soon as they are available. The fastest way to get your results is to activate your My Chart account. Instructions are located on the last page of this paperwork. If you have not heard from [place] regarding the results in 2 weeks, please contact this office.     Health Maintenance After Age 51 After age 43, you are at a higher risk for certain long-term diseases and infections as well as injuries from falls. Falls are a major cause of broken bones and head injuries in people who are older than age 23. Getting regular preventive care can help to keep you healthy and well. Preventive care includes getting regular testing and making lifestyle changes as recommended by your health care provider. Talk with your health care provider about:  Which screenings and tests you should have. A screening is a test that checks for a disease when you have no symptoms.  A diet and exercise plan that is right for you. What should I know about screenings and tests to prevent falls? Screening and testing are the best ways to find a health problem early. Early  diagnosis and treatment give you the best chance of managing medical conditions that are common after age 75. Certain conditions and lifestyle choices may make you more likely to have a fall. Your health care provider may recommend:  Regular vision checks. Poor vision and conditions such as cataracts can make you more likely to have a fall. If you wear glasses, make sure to get your prescription updated if your vision changes.  Medicine review. Work with your health care provider to regularly review all of the medicines you are taking, including over-the-counter medicines. Ask your health care provider about any side effects that may make you more likely to have a fall. Tell your health care provider if any medicines that you take make you feel dizzy or sleepy.  Osteoporosis screening. Osteoporosis is a condition that causes the bones to get weaker. This can make the bones weak and cause them to break more easily.  Blood pressure screening. Blood pressure changes and medicines to control blood pressure can make you feel dizzy.  Strength and balance checks. Your health care provider may recommend certain tests to check your strength and balance while standing, walking, or changing positions.  Foot health exam. Foot pain and numbness, as well as not wearing proper footwear, can make you more likely to have a fall.  Depression screening. You may be more likely to have a fall if you have a fear of falling, feel emotionally low, or feel unable to do activities that you used to do.  Alcohol use screening. Using too much alcohol can affect your balance and may make you more likely to have a fall. What actions can I take to lower my risk of falls? General instructions  Talk with your health care provider about your risks for falling. Tell your health care provider if: ? You fall. Be sure to tell your health care provider about all falls, even ones that seem minor. ? You feel dizzy, sleepy, or  off-balance.  Take over-the-counter and prescription medicines only as told by your health care provider. These include any supplements.  Eat a healthy diet and maintain a healthy weight. A healthy diet includes low-fat dairy products, low-fat (lean) meats, and fiber from whole grains, beans, and lots of fruits and vegetables. Home safety  Remove any tripping hazards, such as rugs, cords, and clutter.  Install safety equipment such as grab bars in bathrooms and safety rails on stairs.  Keep rooms and walkways well-lit. Activity   Follow a regular exercise program to stay fit. This will help you maintain your balance. Ask your health care provider what types of exercise are appropriate for you.  If you need a cane or walker, use it as recommended by your health care provider.  Wear supportive shoes that have nonskid soles. Lifestyle  Do not drink alcohol if your health care provider tells you not to drink.  If you drink alcohol, limit how much you have: ? 0-1 drink a day for women. ? 0-2 drinks a day for men.  Be aware of how much alcohol is in your drink. In the [place] one drink equals one typical bottle of beer (12 oz), one-half glass of wine (5 oz), or one shot of hard liquor (1 oz).  Do not use any products that contain nicotine or tobacco, such as cigarettes and e-cigarettes. If you need help quitting, ask your health care provider. Summary  Having a healthy lifestyle and getting preventive care can help to protect your health and wellness after age 66.  Screening and testing are the best way to find a health problem early and help you avoid having a fall. Early diagnosis and treatment give you the best chance for managing medical conditions that are more common for people who are older than age 12.  Falls are a major cause of broken bones and head injuries in people who are older than age 50. Take precautions to prevent a fall at home.  Work with your health care provider  to learn what changes you can make to improve your health and wellness and to prevent falls. This information is not intended to replace advice given to you by your health care provider. Make sure you discuss any questions you have with your health care provider. Document Revised: 09/14/2018 Document Reviewed: 04/06/2017 Elsevier Patient Education  2020 [employer].

## 2019-11-29 NOTE — Telephone Encounter (Signed)
[facility] called and is wanting to know if it is okay for Dr. Rudell Cobb to sign Home health requests now. And also wanted the last appt notes from today's visit 11/29/19.  Call back number: [phone] Fax: [phone] Please advise.

## 2019-11-29 NOTE — ED Triage Notes (Signed)
Pt states he was here on 6/22 for shob and received a rx which he does not remember and it caused him to pee blood. Pt sts going to PCP today and they didn't give him medications. Pt not sure what medications he was supposed to get??

## 2019-11-29 NOTE — Discharge Instructions (Signed)
Stop taking Keflex and take Augmentin instead.  See your doctor for follow-up.  Stay hydrated.  Return to ER if you have worse blood in your urine, severe abdominal pain, vomiting, fever.

## 2019-11-30 ENCOUNTER — Telehealth: Payer: Self-pay | Admitting: Emergency Medicine

## 2019-11-30 NOTE — Telephone Encounter (Signed)
Post ED Visit - Positive Culture Follow-up  Culture report reviewed by antimicrobial stewardship pharmacist: [facility] []  , Pharm.D. []  Enzo Bi, Pharm.D., BCPS AQ-ID []  , Pharm.D., BCPS []  Celedonio Miyamoto, Pharm.D., BCPS []  Alanreed, Garvin Fila.D., BCPS, AAHIVP []  , Pharm.D., BCPS, AAHIVP []  Georgina Pillion, PharmD, BCPS []  , PharmD, BCPS []  [place], PharmD, BCPS []  [address], PharmD []  , PharmD, BCPS []  Estella Husk, PharmD  Pharmacy Team []  Lysle Pearl, PharmD []  , PharmD []  Phillips Climes, PharmD []  , Rph []  Agapito Games) , PharmD []  Verlan Friends, PharmD []  , PharmD [x]  Mervyn Gay, PharmD []  , PharmD []  Vinnie Level, PharmD []  W[facility] Old[address], PharmD []  , PharmD []  Len Childs, PharmD   Positive urine culture Treated with cephalexin, organism sensitive to the same and no further patient follow-up is required at this time.  11/30/2019, 12:16 PM

## 2019-12-01 ENCOUNTER — Other Ambulatory Visit: Payer: Self-pay | Admitting: Emergency Medicine

## 2019-12-01 DIAGNOSIS — N390 Urinary tract infection, site not specified: Secondary | ICD-10-CM

## 2019-12-01 LAB — URINE CULTURE

## 2019-12-01 MED ORDER — CIPROFLOXACIN HCL 500 MG PO TABS: 500.0000 mg | ORAL_TABLET | Freq: Two times a day (BID) | ORAL | 0 refills | Status: AC

## 2019-12-02 LAB — URINE CULTURE: Culture: >=100000 — AB

## 2019-12-03 ENCOUNTER — Ambulatory Visit: Payer: Medicare PPO | Admitting: Emergency Medicine

## 2019-12-03 ENCOUNTER — Telehealth: Payer: Self-pay | Admitting: Emergency Medicine

## 2019-12-03 NOTE — Telephone Encounter (Signed)
Post ED Visit - Positive Culture Follow-up  Culture report reviewed by antimicrobial stewardship pharmacist: [facility] []  , Pharm.D. []  Enzo Bi, .D., BCPS AQ-ID []  Celedonio Miyamoto, Pharm.D., BCPS []  [address], .D., BCPS []  [place], .D., BCPS, AAHIVP []  Georgina Pillion, Pharm.D., BCPS, AAHIVP []  [address], PharmD, BCPS []  , PharmD, BCPS []  [place], PharmD, BCPS []  [address], PharmD []  , PharmD, BCPS []  Estella Husk, PharmD  Pharmacy Team []  Lysle Pearl, PharmD []  , PharmD []  Phillips Climes, PharmD []  , Rph []  Agapito Games) , PharmD []  Verlan Friends, PharmD []  , PharmD []  Mervyn Gay, PharmD []  , PharmD []  Vinnie Level, PharmD []  [facility], PharmD []  , PharmD []  Len Childs, PharmD   Positive urine culture Treated with Augmentin, organism sensitive to the same and no further patient follow-up is required at this time.  12/03/2019, 11:23 AM

## 2019-12-04 ENCOUNTER — Encounter: Payer: Self-pay | Admitting: Emergency Medicine

## 2019-12-11 ENCOUNTER — Telehealth: Payer: Self-pay | Admitting: Emergency Medicine

## 2019-12-11 NOTE — Telephone Encounter (Signed)
Anne trammel Child psychotherapist with [facility] is calling to let md know the patient refused Presenter, broadcasting

## 2019-12-11 NOTE — Telephone Encounter (Signed)
Pt was last seen on 11/29/19 and he has refused the social work evaluation.   FYI to provider.

## 2019-12-13 ENCOUNTER — Telehealth: Payer: Self-pay | Admitting: *Deleted

## 2019-12-13 NOTE — Telephone Encounter (Signed)
Faxed signed orders to [facility] Attn: Abe People. Confirmation page 8:59 am.

## 2020-01-21 ENCOUNTER — Other Ambulatory Visit (INDEPENDENT_AMBULATORY_CARE_PROVIDER_SITE_OTHER): Payer: Self-pay | Admitting: Nurse Practitioner

## 2020-01-21 DIAGNOSIS — K551 Chronic vascular disorders of intestine: Secondary | ICD-10-CM

## 2020-01-22 ENCOUNTER — Other Ambulatory Visit: Payer: Self-pay

## 2020-01-22 ENCOUNTER — Ambulatory Visit (INDEPENDENT_AMBULATORY_CARE_PROVIDER_SITE_OTHER): Payer: Medicare PPO | Admitting: Nurse Practitioner

## 2020-01-22 ENCOUNTER — Ambulatory Visit (INDEPENDENT_AMBULATORY_CARE_PROVIDER_SITE_OTHER): Payer: Medicare PPO

## 2020-01-22 ENCOUNTER — Encounter (INDEPENDENT_AMBULATORY_CARE_PROVIDER_SITE_OTHER): Payer: Self-pay | Admitting: Nurse Practitioner

## 2020-01-22 VITALS — BP 166/78 | HR 86 | Ht 67.0 in | Wt 122.0 lb

## 2020-01-22 DIAGNOSIS — K551 Chronic vascular disorders of intestine: Secondary | ICD-10-CM | POA: Diagnosis not present

## 2020-01-22 DIAGNOSIS — K559 Vascular disorder of intestine, unspecified: Secondary | ICD-10-CM

## 2020-01-22 DIAGNOSIS — I10 Essential (primary) hypertension: Secondary | ICD-10-CM | POA: Diagnosis not present

## 2020-01-22 DIAGNOSIS — E785 Hyperlipidemia, unspecified: Secondary | ICD-10-CM

## 2020-01-22 NOTE — Progress Notes (Signed)
Subjective:    Patient ID: Jeremy Merritt, male    DOB: 15-Oct-1934, 85 y.o.   MRN: [id number] Chief Complaint  Patient presents with  . 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  The patient denies abdominal pain or postprandial symptoms.  The patient denies weight loss as well as nausea.  The patient does not substantiate food fear, particular foods do not seem to aggravate or alleviate the symptoms.  The patient denies bloody bowel movements or diarrhea.  The patient has a history of colonoscopy which was not diagnostic.  No history of peptic ulcer disease.    No prior peripheral angiograms or vascular interventions.  The patient denies amaurosis fugax or recent TIA symptoms. There are no recent neurological changes noted. The patient denies claudication symptoms or rest pain symptoms. The patient denies history of DVT, PE or superficial thrombophlebitis. The patient denies recent episodes of angina   70 to 99% stenosis in the celiac artery and superior mesenteric artery.  Velocities relatively unchanged from 10/18/2019   Review of Systems  Gastrointestinal: Negative for abdominal pain, nausea and vomiting.  All other systems reviewed and are negative.      Objective:   Physical Exam Vitals reviewed.  HENT:     Head: Normocephalic.  Cardiovascular:     Rate and Rhythm: Normal rate and regular rhythm.     Pulses: Normal pulses.     Heart sounds: Normal heart sounds.  Pulmonary:     Effort: Pulmonary effort is normal.     Breath sounds: Normal breath sounds.  Skin:    General: Skin is warm and dry.  Neurological:     Mental Status: He is alert and oriented to person, place, and time.  Psychiatric:        Mood and Affect: Mood normal.        Behavior: Behavior normal.        Thought Content: Thought content normal.        Judgment:  Judgment normal.     BP (!) 166/78   Pulse 86   Ht 5\' 7"  (1.702 m)   Wt 122 lb (55.3 kg)   BMI 19.11 kg/m   Past Medical History:  Diagnosis Date  . Allergy   . Bilateral lower extremity edema 05/04/2019  . Hyperlipidemia   . Hypertension   . Thyroid disease     Social History   Socioeconomic History  . Marital status: Widowed    Spouse name: Not on file  . Number of children: 2  . Years of education: Not on file  . Highest education level: Not on file  Occupational History  . Occupation: retired  Tobacco Use  . Smoking status: Never Smoker  . Smokeless tobacco: Never Used  Vaping Use  . Vaping Use: Never used  Substance and Sexual Activity  . Alcohol use: No  . Drug use: No  . Sexual activity: Never  Other Topics Concern  . Not on file  Social History Narrative  . Not on file   Social Determinants of Health   Financial Resource Strain:   . Difficulty of Paying Living Expenses:   Food Insecurity:   . Worried About 05/06/2019 in the Last Year:   . Programme researcher, broadcasting/film/video in the Last Year:   Transportation Needs:   . Lack of Transportation (  Medical):   Marland Kitchen Lack of Transportation (Non-Medical):   Physical Activity:   . Days of Exercise per Week:   . Minutes of Exercise per Session:   Stress:   . Feeling of Stress :   Social Connections:   . Frequency of Communication with Friends and Family:   . Frequency of Social Gatherings with Friends and Family:   . Attends Religious Services:   . Active Member of Clubs or Organizations:   . Attends Banker Meetings:   Marland Kitchen Marital Status:   Intimate Partner Violence:   . Fear of Current or Ex-Partner:   . Emotionally Abused:   Marland Kitchen Physically Abused:   . Sexually Abused:     Past Surgical History:  Procedure Laterality Date  . COLONOSCOPY WITH PROPOFOL N/A 04/28/2019   Procedure: COLONOSCOPY WITH PROPOFOL;  Surgeon: Toney Reil, MD;  Location: [facility] ENDOSCOPY;  Service: Gastroenterology;   Laterality: N/A;  . ESOPHAGOGASTRODUODENOSCOPY (EGD) WITH PROPOFOL N/A 04/28/2019   Procedure: ESOPHAGOGASTRODUODENOSCOPY (EGD) WITH PROPOFOL;  Surgeon: Toney Reil, MD;  Location: [facility] ENDOSCOPY;  Service: Gastroenterology;  Laterality: N/A;  . INGUINAL HERNIA REPAIR    . VISCERAL ANGIOGRAPHY N/A 04/18/2019   Procedure: VISCERAL ANGIOGRAPHY;  Surgeon: Annice Needy, MD;  Location: [facility] INVASIVE CV LAB;  Service: Cardiovascular;  Laterality: N/A;    Family History  Problem Relation Age of Onset  . Other Mother        died of old age  . Other Father        Black lung  . Thyroid cancer Sister   . Kidney failure Brother        on dialysis  . Colon cancer Neg Hx   . Liver cancer Neg Hx   . Stomach cancer Neg Hx     No Known Allergies     Assessment & Plan:   1. Mesenteric ischemia (HCC) Recommend:  The patient has evidence of chronic asymptomatic mesenteric atherosclerosis.  The patient denies lifestyle limiting changes at this point in time.  Given the lack of symptoms no intervention is warranted at this time.   No further invasive studies, angiography or surgery at this time  The patient should continue walking and begin a more formal exercise program.   The patient should continue antiplatelet therapy and aggressive treatment of the lipid abnormalities  Patient should undergo noninvasive studies as ordered in 6 months The patient will follow up with me after the studies.    2. Essential hypertension Continue antihypertensive medications as already ordered, these medications have been reviewed and there are no changes at this time.   3. Dyslipidemia Continue statin as ordered and reviewed, no changes at this time    Current Outpatient Medications on File Prior to Visit  Medication Sig Dispense Refill  . amoxicillin-clavulanate (AUGMENTIN) 875-125 MG tablet Take 1 tablet by mouth 2 (two) times daily. One po bid x 7 days 14 tablet 0  . aspirin EC 81 MG tablet  Take 81 mg by mouth daily.    . Cholecalciferol (VITAMIN D3) 2000 units TABS Take 2,000 Units by mouth daily.     . clopidogrel (PLAVIX) 75 MG tablet Take 1 tablet (75 mg total) by mouth daily. 90 tablet 1  . dronabinol (MARINOL) 2.5 MG capsule Take 1 capsule (2.5 mg total) by mouth 2 (two) times daily before lunch and supper. 60 capsule 1  . lisinopril-hydrochlorothiazide (ZESTORETIC) 20-12.5 MG tablet Take 1 tablet by mouth daily.    . megestrol (  MEGACE) 40 MG tablet Take 40 mg by mouth daily.    Marland Kitchen omeprazole (PRILOSEC) 40 MG capsule Take 1 capsule (40 mg total) by mouth 2 (two) times daily. 60 capsule 2  . levothyroxine (SYNTHROID) 100 MCG tablet Take 1 tablet (100 mcg total) by mouth daily before breakfast. 90 tablet 1  . simvastatin (ZOCOR) 20 MG tablet Take 1 tablet (20 mg total) by mouth at bedtime. 90 tablet 1   No current facility-administered medications on file prior to visit.    There are no Patient Instructions on file for this visit. No follow-ups on file.   Georgiana Spinner, NP

## 2020-02-04 IMAGING — DX DG ABDOMEN 2V
3 series · 3 of 3 positions shown · non-contrast
Comparison: None.

CLINICAL DATA: Upper abdominal pain

EXAM:
ABDOMEN - 2 VIEW

[abdomen erect]
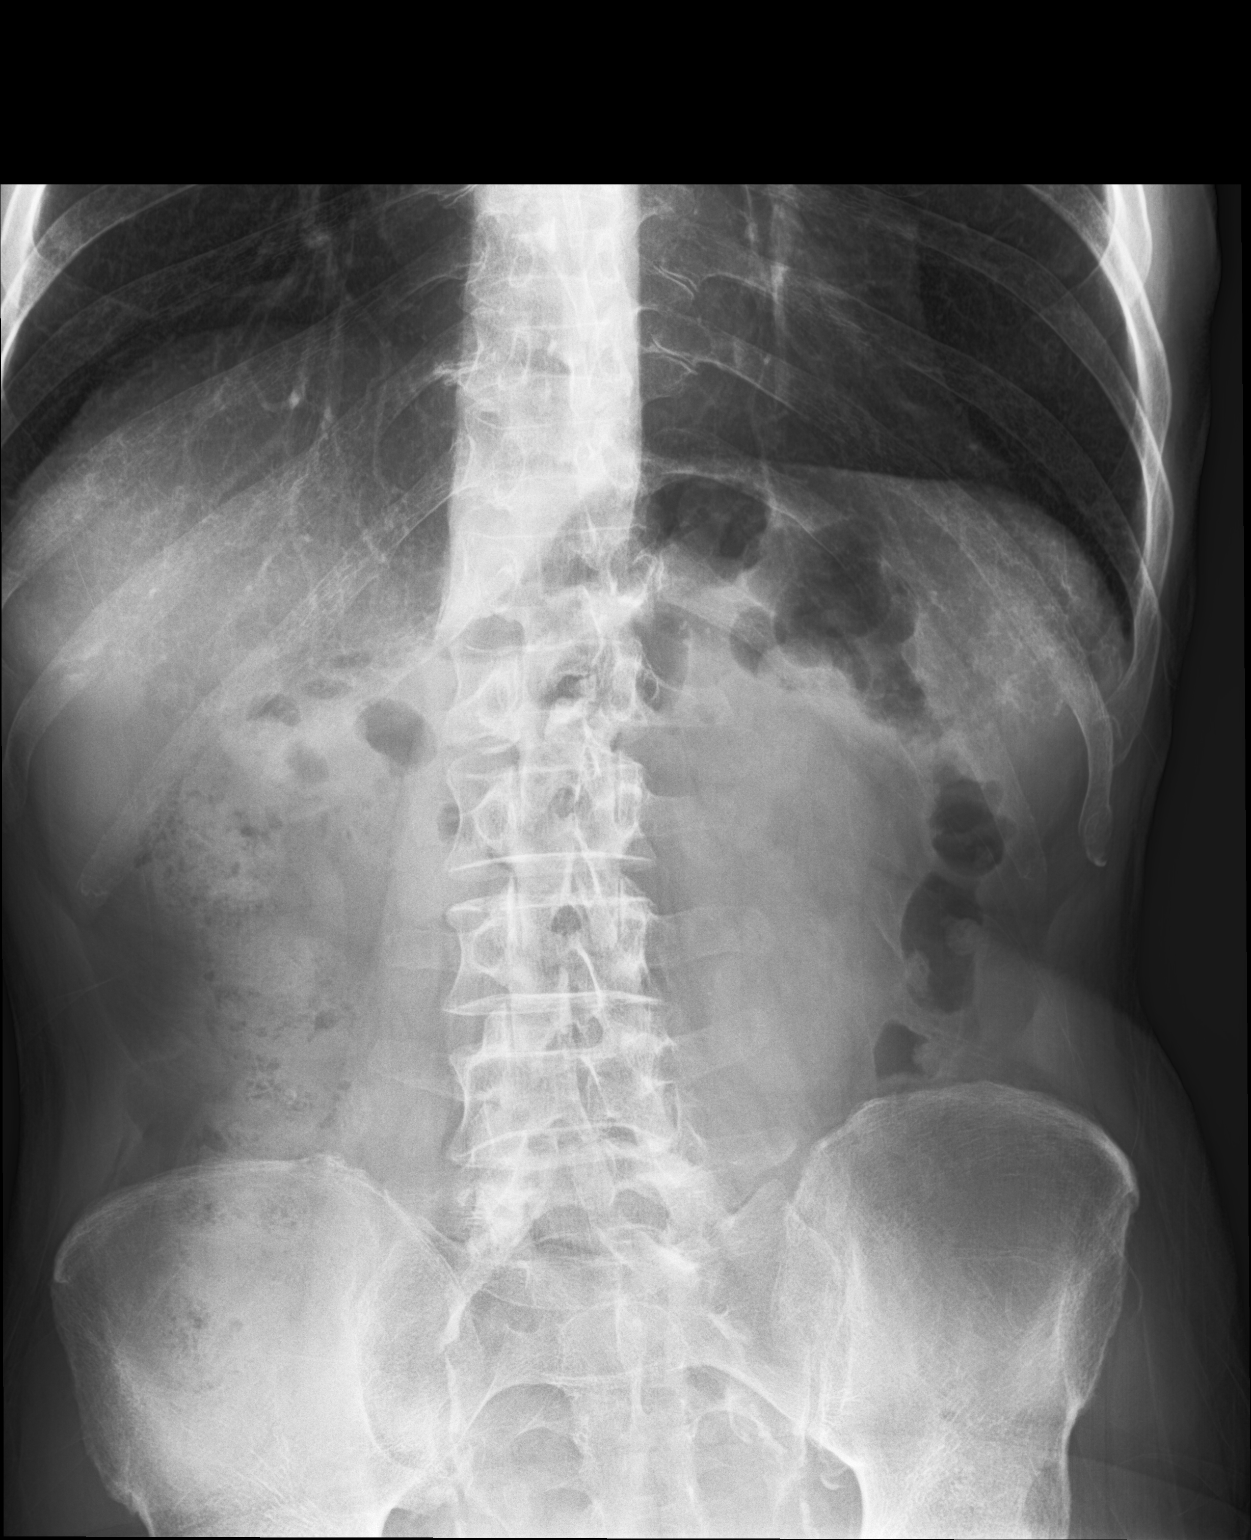

[abdomen supine (1 of 2)]
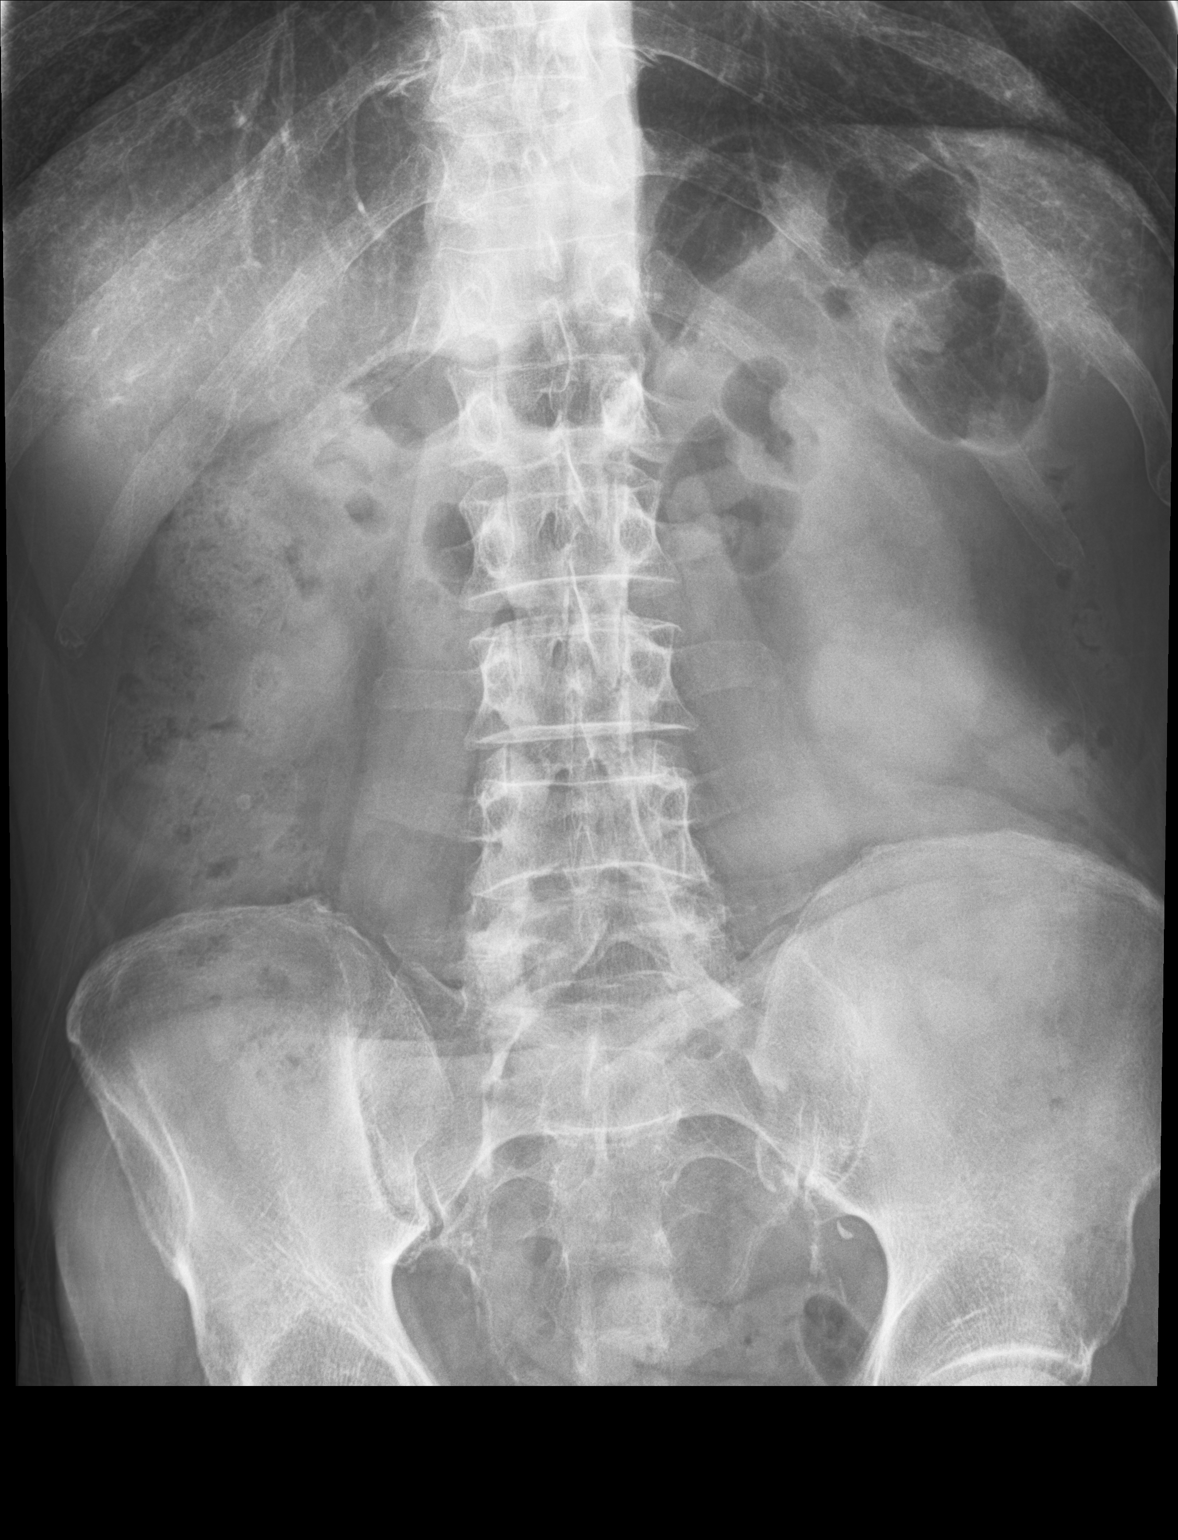

[abdomen supine (2 of 2)]
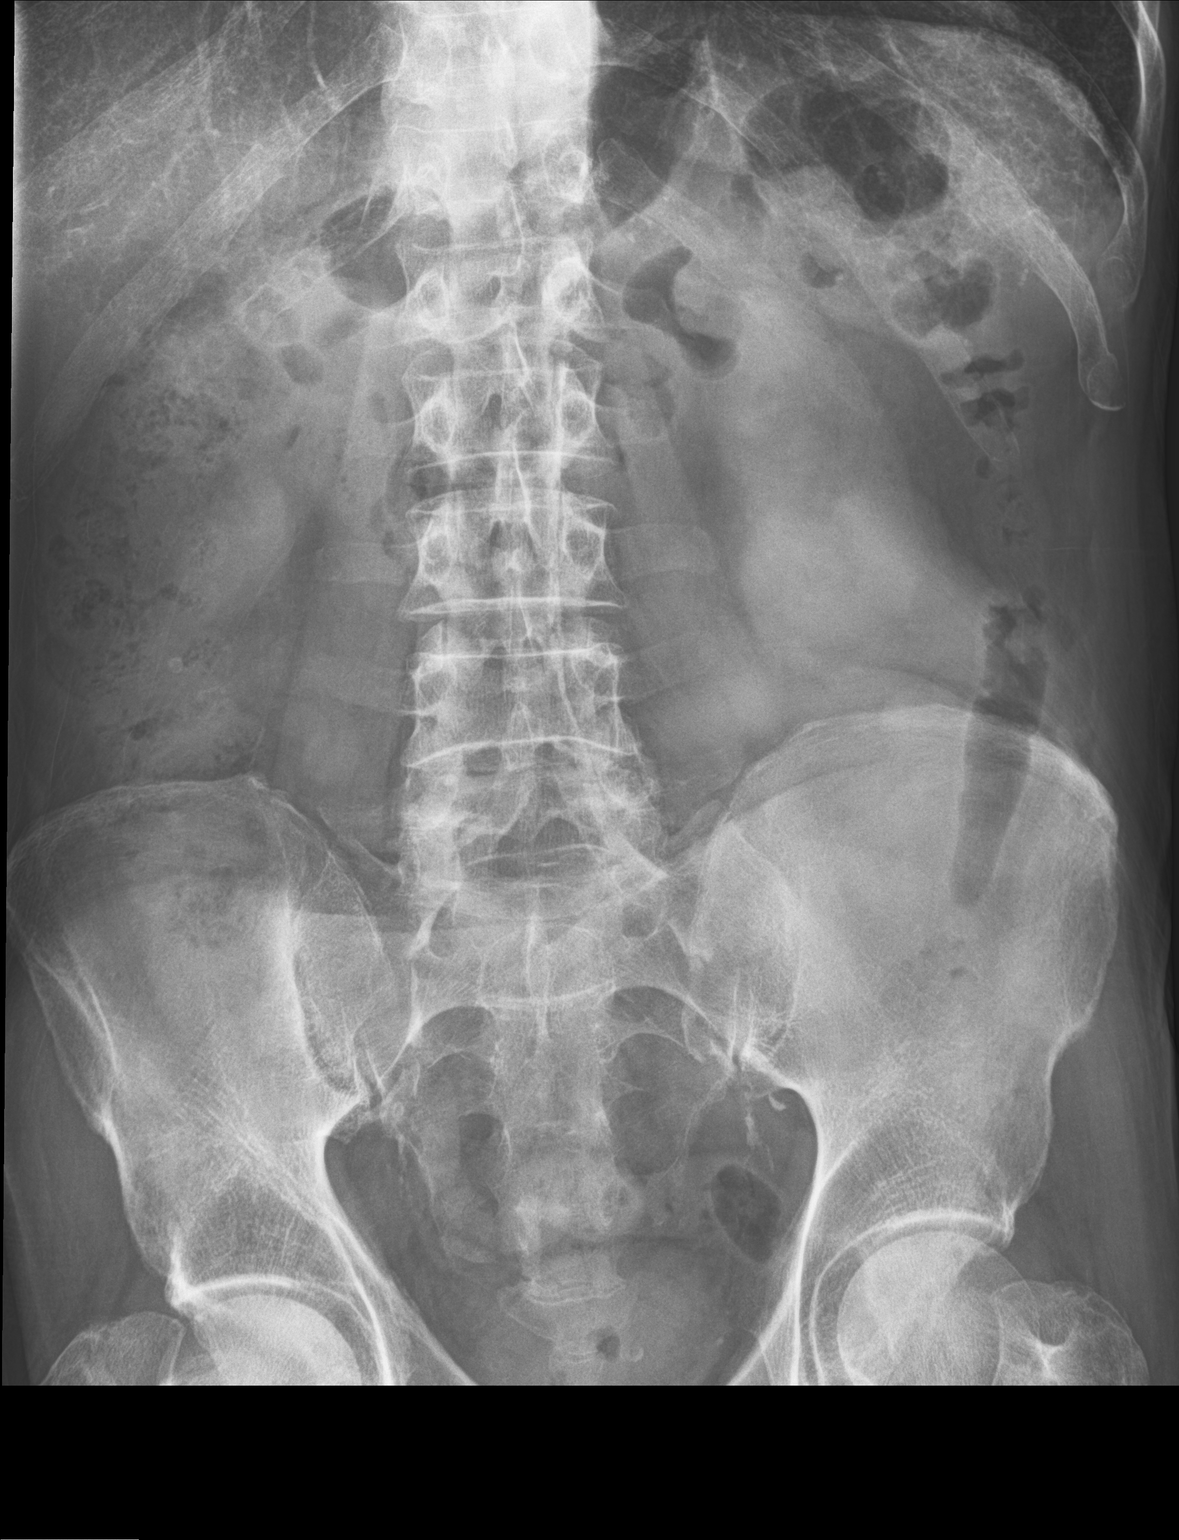

[3 of 3 positions shown; findings below may reference images not displayed]

FINDINGS: Supine and erect views of the abdomen show no bowel obstruction. No
free air is seen. There is a moderate amount of feces throughout the
colon. No definite renal or ureteral calculi are noted. The bones
are unremarkable.
IMPRESSION: 1. Moderate amount of feces throughout the colon. No bowel
obstruction.
2. No opaque calculi.
3. No renal or ureteral calculi are noted.

## 2020-02-20 ENCOUNTER — Encounter: Payer: Self-pay | Admitting: Family Medicine

## 2020-02-20 ENCOUNTER — Other Ambulatory Visit: Payer: Self-pay

## 2020-02-20 ENCOUNTER — Ambulatory Visit (INDEPENDENT_AMBULATORY_CARE_PROVIDER_SITE_OTHER): Payer: Medicare PPO | Admitting: Family Medicine

## 2020-02-20 VITALS — BP 123/74 | HR 94 | Temp 98.7°F | Ht 67.0 in | Wt 124.0 lb

## 2020-02-20 DIAGNOSIS — K551 Chronic vascular disorders of intestine: Secondary | ICD-10-CM | POA: Diagnosis not present

## 2020-02-20 DIAGNOSIS — E039 Hypothyroidism, unspecified: Secondary | ICD-10-CM

## 2020-02-20 DIAGNOSIS — I10 Essential (primary) hypertension: Secondary | ICD-10-CM

## 2020-02-20 DIAGNOSIS — E785 Hyperlipidemia, unspecified: Secondary | ICD-10-CM | POA: Diagnosis not present

## 2020-02-20 DIAGNOSIS — R739 Hyperglycemia, unspecified: Secondary | ICD-10-CM

## 2020-02-20 MED ORDER — LEVOTHYROXINE SODIUM 100 MCG PO TABS: 100.0000 ug | ORAL_TABLET | Freq: Every day | ORAL | 1 refills | Status: DC

## 2020-02-20 MED ORDER — CLOPIDOGREL BISULFATE 75 MG PO TABS: 75.0000 mg | ORAL_TABLET | Freq: Every day | ORAL | 1 refills | Status: DC

## 2020-02-20 MED ORDER — SIMVASTATIN 20 MG PO TABS: 20.0000 mg | ORAL_TABLET | Freq: Every day | ORAL | 1 refills | Status: DC

## 2020-02-20 NOTE — Patient Instructions (Addendum)
I do recommend COVID-19 vaccine, and please get that scheduled as soon as possible if you decide to get that vaccine as you are higher risk for severe infection  I will check some other lab work today but no medication changes at this time.  Please let me know if there are questions.  Nice meeting you and I will see you again in 6 months.    If you have lab work done today you will be contacted with your lab results within the next 2 weeks.  If you have not heard from [place] then please contact us. The fastest way to get your results is to register for My Chart.   IF you received an x-ray today, you will receive an invoice from [facility]. Please contact [facility] at [phone] with questions or concerns regarding your invoice.   IF you received labwork today, you will receive an invoice from [place]. Please contact [facility] at [phone] with questions or concerns regarding your invoice.   Our billing staff will not be able to assist you with questions regarding bills from these companies.  You will be contacted with the lab results as soon as they are available. The fastest way to get your results is to activate your My Chart account. Instructions are located on the last page of this paperwork. If you have not heard from [place] regarding the results in 2 weeks, please contact this office.

## 2020-02-20 NOTE — Progress Notes (Signed)
Subjective:  Patient ID: Jeremy Merritt, male    DOB: 11-19-1934  Age: 85 y.o. MRN: [id number]  CC:  Chief Complaint  Patient presents with  . Establish Care    Pt reports he feels "pretty good" with no complants.    HPI Jeremy Merritt presents for  New patient to me to establish care  History of chronic mesenteric ischemia hx severe chronic mesenteric ischemia status post angioplasty and stenting in November 2020. Currently takes Plavix 75 mg daily.  Has been seen by both gastroenterology and vascular.  Gastroenterology at [facility], appointment July 9.  Constipation prevention discussed with MiraLAX, continued on omeprazole for peptic ulcer disease, also history of rectal ulcers with previous acute blood loss anemia but bowels were regular at his last gastroenterology appointment,  avoiding NSAIDs.   Vascular appointment August 17th.  [facility].  Chronic asymptomatic mesenteric atherosclerosis.  No intervention was warranted as lack of symptoms.  Continued antiplatelet therapy, and treatment of lipid abnormalities recommended.  Walking and exercise discussed.  Hypothyroidism: Lab Results  Component Value Date   TSH 10.961 (H) 10/14/2019  Last tested in May.  Currently taking Synthroid 100 mcg daily. Taking medication daily.  No new hot or cold intolerance. No new hair or skin changes, heart palpitations or new fatigue. No new weight changes.    Hyperlipidemia: Simvastatin 20 mg daily. Breakfast at 9am. - 6 hrs ago.  No results found for: CHOL, HDL, LDLCALC, LDLDIRECT, TRIG, CHOLHDL Lab Results  Component Value Date   ALT 25 04/26/2019   AST 26 04/26/2019   ALKPHOS 80 04/26/2019   BILITOT 0.7 04/26/2019   Hypertension: With elevating creatinine. Treated with lisinopril HCTZ 20/12.5 mg daily no new side effects of meds. Feeling good.  Home readings:none.  Cr 0.89 in 04/2019, 1.37 in 10/2019.  No nsaids. Could drink more water during  the day. Drinks gatorade.  BP Readings from Last 3 Encounters:  02/20/20 123/74  01/22/20 (!) 166/78  11/29/19 (!) 168/70   Lab Results  Component Value Date   CREATININE 1.51 (H) 11/29/2019   Hyperglycemia 124 in June. No known hx of DM2.   HM: has not had covid vaccine, not sure if wants it.  Declines flu vaccine - [facility]   History Patient Active Problem List   Diagnosis Date Noted  . 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 Hypothyroidism 05/22/2019  . Bilateral lower extremity edema 05/04/2019  . Essential hypertension 04/17/2019  . Dyslipidemia 04/17/2019  . Mesenteric ischemia (HCC) 04/17/2019  . Chronic mesenteric ischemia (HCC) 10/17/2018  . Chronic abdominal pain 10/17/2018   Past Medical History:  Diagnosis Date  . Allergy   . Bilateral lower extremity edema 05/04/2019  . Hyperlipidemia   . Hypertension   . Thyroid disease    Past Surgical History:  Procedure Laterality Date  . COLONOSCOPY WITH PROPOFOL N/A 04/28/2019   Procedure: COLONOSCOPY WITH PROPOFOL;  Surgeon: Toney Reil, MD;  Location: [facility] ENDOSCOPY;  Service: Gastroenterology;  Laterality: N/A;  . ESOPHAGOGASTRODUODENOSCOPY (EGD) WITH PROPOFOL N/A 04/28/2019   Procedure: ESOPHAGOGASTRODUODENOSCOPY (EGD) WITH PROPOFOL;  Surgeon: Toney Reil, MD;  Location: [facility] ENDOSCOPY;  Service: Gastroenterology;  Laterality: N/A;  . INGUINAL HERNIA REPAIR    . VISCERAL ANGIOGRAPHY N/A 04/18/2019   Procedure: VISCERAL ANGIOGRAPHY;  Surgeon: Annice Needy, MD;  Location: [facility] INVASIVE CV LAB;  Service: Cardiovascular;  Laterality: N/A;   No Known Allergies Prior to Admission medications   Medication  Sig Start Date End Date Taking? Authorizing Provider  aspirin EC 81 MG tablet Take 81 mg by mouth daily.   Yes [provider]  Cholecalciferol (VITAMIN D3) 2000 units TABS Take 2,000 Units by mouth daily.    Yes [provider]  clopidogrel (PLAVIX) 75 MG tablet Take 1  tablet (75 mg total) by mouth daily. 08/29/19  Yes Georgina Quint, MD  levothyroxine (SYNTHROID) 100 MCG tablet Take 1 tablet (100 mcg total) by mouth daily before breakfast. 08/29/19 02/20/20 Yes Sagardia, Eilleen Kempf, MD  lisinopril-hydrochlorothiazide (ZESTORETIC) 20-12.5 MG tablet Take 1 tablet by mouth daily.   Yes [provider]  megestrol (MEGACE) 40 MG tablet Take 40 mg by mouth daily. 09/22/19  Yes [provider]  omeprazole (PRILOSEC) 40 MG capsule Take 1 capsule (40 mg total) by mouth 2 (two) times daily. 04/29/19  Yes Esaw Grandchild A, DO  simvastatin (ZOCOR) 20 MG tablet Take 1 tablet (20 mg total) by mouth at bedtime. 08/29/19 02/20/20 Yes Sagardia, Eilleen Kempf, MD   Social History   Socioeconomic History  . Marital status: Widowed    Spouse name: Not on file  . Number of children: 2  . Years of education: Not on file  . Highest education level: Not on file  Occupational History  . Occupation: retired  Tobacco Use  . Smoking status: Never Smoker  . Smokeless tobacco: Never Used  Vaping Use  . Vaping Use: Never used  Substance and Sexual Activity  . Alcohol use: No  . Drug use: No  . Sexual activity: Never  Other Topics Concern  . Not on file  Social History Narrative  . Not on file   Social Determinants of Health   Financial Resource Strain:   . Difficulty of Paying Living Expenses: Not on file  Food Insecurity:   . Worried About Programme researcher, broadcasting/film/video in the Last Year: Not on file  . Ran Out of Food in the Last Year: Not on file  Transportation Needs:   . Lack of Transportation (Medical): Not on file  . Lack of Transportation (Non-Medical): Not on file  Physical Activity:   . Days of Exercise per Week: Not on file  . Minutes of Exercise per Session: Not on file  Stress:   . Feeling of Stress : Not on file  Social Connections:   . Frequency of Communication with Friends and Family: Not on file  . Frequency of Social Gatherings with  Friends and Family: Not on file  . Attends Religious Services: Not on file  . Active Member of Clubs or Organizations: Not on file  . Attends Banker Meetings: Not on file  . Marital Status: Not on file  Intimate Partner Violence:   . Fear of Current or Ex-Partner: Not on file  . Emotionally Abused: Not on file  . Physically Abused: Not on file  . Sexually Abused: Not on file    Review of Systems  Constitutional: Negative for fatigue and unexpected weight change.  Eyes: Negative for visual disturbance.  Respiratory: Negative for cough, chest tightness and shortness of breath.   Cardiovascular: Negative for chest pain, palpitations and leg swelling.  Gastrointestinal: Negative for abdominal pain and blood in stool.  Neurological: Negative for dizziness, light-headedness and headaches.   Per hpi  Objective:   Vitals:   02/20/20 1417  BP: 123/74  Pulse: 94  Temp: 98.7 F (37.1 C)  TempSrc: Temporal  SpO2: 99%  Weight: 124 lb (56.2  kg)  Height: 5\' 7"  (1.702 m)     Physical Exam Vitals reviewed.  Constitutional:      Appearance: He is well-developed.  HENT:     Head: Normocephalic and atraumatic.  Eyes:     Pupils: Pupils are equal, round, and reactive to light.  Neck:     Vascular: No carotid bruit or JVD.  Cardiovascular:     Rate and Rhythm: Normal rate and regular rhythm.     Heart sounds: Normal heart sounds. No murmur heard.   Pulmonary:     Effort: Pulmonary effort is normal.     Breath sounds: Normal breath sounds. No rales.  Abdominal:     General: Abdomen is flat.     Palpations: Abdomen is soft.     Tenderness: There is no abdominal tenderness.  Skin:    General: Skin is warm and dry.  Neurological:     Mental Status: He is alert and oriented to person, place, and time.  Psychiatric:        Mood and Affect: Mood normal.        Behavior: Behavior normal.        Assessment & Plan:  Jeremy Merritt is a 85 y.o. male  . Chronic mesenteric ischemia (HCC) - Plan: clopidogrel (PLAVIX) 75 MG tablet  -Asymptomatic, stable with Plavix.  Continue routine follow-up with gastroenterology, vascular specialist.  Hypothyroidism, unspecified type - Plan: TSH + free T4, levothyroxine (SYNTHROID) 100 MCG tablet  -Borderline control previously, continue Synthroid, check TSH, free T4  Essential hypertension - Plan: Comprehensive metabolic panel  -Stable, continue same regimen.  Check labs  Dyslipidemia - Plan: Lipid panel, Comprehensive metabolic panel, simvastatin (ZOCOR) 20 MG tablet  -Tolerating statin, continue same, check labs  Hyperglycemia - Plan: Hemoglobin A1c  COVID-19 vaccine recommended, especially with his age and increased risk of severe disease.  Denies specific questions at this time. Meds ordered this encounter  Medications  . simvastatin (ZOCOR) 20 MG tablet    Sig: Take 1 tablet (20 mg total) by mouth at bedtime.    Dispense:  90 tablet    Refill:  1  . clopidogrel (PLAVIX) 75 MG tablet    Sig: Take 1 tablet (75 mg total) by mouth daily.    Dispense:  90 tablet    Refill:  1  . levothyroxine (SYNTHROID) 100 MCG tablet    Sig: Take 1 tablet (100 mcg total) by mouth daily before breakfast.    Dispense:  90 tablet    Refill:  1   Patient Instructions   I do recommend COVID-19 vaccine, and please get that scheduled as soon as possible if you decide to get that vaccine as you are higher risk for severe infection  I will check some other lab work today but no medication changes at this time.  Please let me know if there are questions.  Nice meeting you and I will see you again in 6 months.    If you have lab work done today you will be contacted with your lab results within the next 2 weeks.  If you have not heard from 97 then please contact us. The fastest way to get your results is to register for My Chart.   IF you received an x-ray today, you will receive an invoice from [facility]. Please contact [facility] at [phone] with questions or concerns regarding your invoice.   IF you received labwork today, you will receive an invoice from [phone].  Please contact [facility] at [phone] with questions or concerns regarding your invoice.   Our billing staff will not be able to assist you with questions regarding bills from these companies.  You will be contacted with the lab results as soon as they are available. The fastest way to get your results is to activate your My Chart account. Instructions are located on the last page of this paperwork. If you have not heard from [place] regarding the results in 2 weeks, please contact this office.         Signed, Merri Ray, MD [facility]

## 2020-02-21 ENCOUNTER — Other Ambulatory Visit: Payer: Self-pay | Admitting: Family Medicine

## 2020-02-21 LAB — COMPREHENSIVE METABOLIC PANEL
ALT: 9 IU/L (ref 0–44)
AST: 14 IU/L (ref 0–40)
Albumin/Globulin Ratio: 1.9 (ref 1.2–2.2)
Albumin: 4.1 g/dL (ref 3.6–4.6)
Alkaline Phosphatase: 92 IU/L (ref 44–121)
BUN/Creatinine Ratio: 13 (ref 10–24)
BUN: 19 mg/dL (ref 8–27)
Bilirubin Total: 0.4 mg/dL (ref 0.0–1.2)
CO2: 23 mmol/L (ref 20–29)
Calcium: 9 mg/dL (ref 8.6–10.2)
Chloride: 107 mmol/L — ABNORMAL HIGH (ref 96–106)
Creatinine, Ser: 1.46 mg/dL — ABNORMAL HIGH (ref 0.76–1.27)
GFR calc Af Amer: 51 mL/min/{1.73_m2} — ABNORMAL LOW (ref >59)
GFR calc non Af Amer: 44 mL/min/{1.73_m2} — ABNORMAL LOW (ref >59)
Globulin, Total: 2.2 g/dL (ref 1.5–4.5)
Glucose: 98 mg/dL (ref 65–99)
Potassium: 3.9 mmol/L (ref 3.5–5.2)
Sodium: 142 mmol/L (ref 134–144)
Total Protein: 6.3 g/dL (ref 6.0–8.5)

## 2020-02-21 LAB — LIPID PANEL
Chol/HDL Ratio: 4.5 ratio (ref 0.0–5.0)
Cholesterol, Total: 141 mg/dL (ref 100–199)
HDL: 31 mg/dL — ABNORMAL LOW (ref >39)
LDL Chol Calc (NIH): 91 mg/dL (ref 0–99)
Triglycerides: 104 mg/dL (ref 0–149)
VLDL Cholesterol Cal: 19 mg/dL (ref 5–40)

## 2020-02-21 LAB — HEMOGLOBIN A1C
Est. average glucose Bld gHb Est-mCnc: 105 mg/dL
Hgb A1c MFr Bld: 5.3 % (ref 4.8–5.6)

## 2020-02-21 LAB — TSH+FREE T4
Free T4: 1.28 ng/dL (ref 0.82–1.77)
TSH: 4.27 u[IU]/mL (ref 0.450–4.500)

## 2020-02-21 NOTE — Telephone Encounter (Signed)
Pt called in requesting a prescription for vitamin D. States that [facility] will not fill prescription without auth. Please advise

## 2020-02-21 NOTE — Telephone Encounter (Signed)
The Vitamin D3 2000 units is an over the counter medication.

## 2020-02-25 ENCOUNTER — Other Ambulatory Visit: Payer: Self-pay

## 2020-02-25 ENCOUNTER — Ambulatory Visit: Payer: Medicare PPO | Admitting: Registered Nurse

## 2020-02-25 ENCOUNTER — Encounter: Payer: Self-pay | Admitting: Registered Nurse

## 2020-02-25 VITALS — BP 158/77 | HR 99 | Temp 98.0°F | Resp 18 | Ht 67.0 in | Wt 123.4 lb

## 2020-02-25 DIAGNOSIS — Z760 Encounter for issue of repeat prescription: Secondary | ICD-10-CM | POA: Diagnosis not present

## 2020-02-25 MED ORDER — VITAMIN D3 50 MCG (2000 UT) PO TABS: 2000.0000 [IU] | ORAL_TABLET | Freq: Every day | ORAL | 3 refills | Status: AC

## 2020-02-25 NOTE — Patient Instructions (Signed)
° ° ° °  If you have lab work done today you will be contacted with your lab results within the next 2 weeks.  If you have not heard from us then please contact us. The fastest way to get your results is to register for My Chart. ° ° °IF you received an x-ray today, you will receive an invoice from [facility]. Please contact [facility] at [phone] with questions or concerns regarding your invoice.  ° °IF you received labwork today, you will receive an invoice from [facility]. Please contact [facility] at [phone] with questions or concerns regarding your invoice.  ° °Our billing staff will not be able to assist you with questions regarding bills from these companies. ° °You will be contacted with the lab results as soon as they are available. The fastest way to get your results is to activate your My Chart account. Instructions are located on the last page of this paperwork. If you have not heard from us regarding the results in 2 weeks, please contact this office. °  ° ° ° °

## 2020-02-25 NOTE — Progress Notes (Signed)
Established Patient Office Visit  Subjective:  Patient ID: Jeremy Merritt, male    DOB: 08-14-1934  Age: 85 y.o. MRN: [id number]  CC:  Chief Complaint  Patient presents with  . Medication Refill    patient states he needs a medication refill on Vitamin D. Per patient he has no other questions or concerns    HPI Jeremy Merritt presents for med refill Vitamin D 2000 IU po qd No adverse effects Has been prescribed by another provider in the past - would like our office to take over this prescription Denies AEs. No recent vit d checks on file Denies symptoms of deficiency - will not check labs today   Past Medical History:  Diagnosis Date  . Allergy   . Bilateral lower extremity edema 05/04/2019  . Hyperlipidemia   . Hypertension   . Thyroid disease     Past Surgical History:  Procedure Laterality Date  . COLONOSCOPY WITH PROPOFOL N/A 04/28/2019   Procedure: COLONOSCOPY WITH PROPOFOL;  Surgeon: Toney Reil, MD;  Location: [facility] ENDOSCOPY;  Service: Gastroenterology;  Laterality: N/A;  . ESOPHAGOGASTRODUODENOSCOPY (EGD) WITH PROPOFOL N/A 04/28/2019   Procedure: ESOPHAGOGASTRODUODENOSCOPY (EGD) WITH PROPOFOL;  Surgeon: Toney Reil, MD;  Location: [facility] ENDOSCOPY;  Service: Gastroenterology;  Laterality: N/A;  . INGUINAL HERNIA REPAIR    . VISCERAL ANGIOGRAPHY N/A 04/18/2019   Procedure: VISCERAL ANGIOGRAPHY;  Surgeon: Annice Needy, MD;  Location: [facility] INVASIVE CV LAB;  Service: Cardiovascular;  Laterality: N/A;    Family History  Problem Relation Age of Onset  . Other Mother        died of old age  . Other Father        Black lung  . Thyroid cancer Sister   . Kidney failure Brother        on dialysis  . Colon cancer Neg Hx   . Liver cancer Neg Hx   . Stomach cancer Neg Hx     Social History   Socioeconomic History  . Marital status: Widowed    Spouse name: Not on file  . Number of children: 2  . Years of education: Not on file  .  Highest education level: Not on file  Occupational History  . Occupation: retired  Tobacco Use  . Smoking status: Never Smoker  . Smokeless tobacco: Never Used  Vaping Use  . Vaping Use: Never used  Substance and Sexual Activity  . Alcohol use: No  . Drug use: No  . Sexual activity: Never  Other Topics Concern  . Not on file  Social History Narrative  . Not on file   Social Determinants of Health   Financial Resource Strain:   . Difficulty of Paying Living Expenses: Not on file  Food Insecurity:   . Worried About Programme researcher, broadcasting/film/video in the Last Year: Not on file  . Ran Out of Food in the Last Year: Not on file  Transportation Needs:   . Lack of Transportation (Medical): Not on file  . Lack of Transportation (Non-Medical): Not on file  Physical Activity:   . Days of Exercise per Week: Not on file  . Minutes of Exercise per Session: Not on file  Stress:   . Feeling of Stress : Not on file  Social Connections:   . Frequency of Communication with Friends and Family: Not on file  . Frequency of Social Gatherings with Friends and Family: Not on file  . Attends Religious Services: Not on file  .  Active Member of Clubs or Organizations: Not on file  . Attends Banker Meetings: Not on file  . Marital Status: Not on file  Intimate Partner Violence:   . Fear of Current or Ex-Partner: Not on file  . Emotionally Abused: Not on file  . Physically Abused: Not on file  . Sexually Abused: Not on file    Outpatient Medications Prior to Visit  Medication Sig Dispense Refill  . aspirin EC 81 MG tablet Take 81 mg by mouth daily.    . Cholecalciferol (VITAMIN D3) 2000 units TABS Take 2,000 Units by mouth daily.     . clopidogrel (PLAVIX) 75 MG tablet Take 1 tablet (75 mg total) by mouth daily. 90 tablet 1  . levothyroxine (SYNTHROID) 100 MCG tablet Take 1 tablet (100 mcg total) by mouth daily before breakfast. 90 tablet 1  . lisinopril-hydrochlorothiazide (ZESTORETIC)  20-12.5 MG tablet Take 1 tablet by mouth daily.    . megestrol (MEGACE) 40 MG tablet Take 40 mg by mouth daily.    Marland Kitchen omeprazole (PRILOSEC) 40 MG capsule Take 1 capsule (40 mg total) by mouth 2 (two) times daily. 60 capsule 2  . simvastatin (ZOCOR) 20 MG tablet Take 1 tablet (20 mg total) by mouth at bedtime. 90 tablet 1   No facility-administered medications prior to visit.    No Known Allergies  ROS Review of Systems  Constitutional: Negative.   HENT: Negative.   Eyes: Negative.   Respiratory: Negative.   Cardiovascular: Negative.   Gastrointestinal: Negative.   Genitourinary: Negative.   Musculoskeletal: Negative.   Skin: Negative.   Neurological: Negative.   Psychiatric/Behavioral: Negative.       Objective:    Physical Exam Constitutional:      General: He is not in acute distress.    Appearance: Normal appearance. He is normal weight. He is not ill-appearing, toxic-appearing or diaphoretic.  Cardiovascular:     Rate and Rhythm: Normal rate and regular rhythm.     Heart sounds: Normal heart sounds. No murmur heard.  No friction rub. No gallop.   Pulmonary:     Effort: Pulmonary effort is normal. No respiratory distress.     Breath sounds: Normal breath sounds. No stridor. No wheezing, rhonchi or rales.  Chest:     Chest wall: No tenderness.  Neurological:     General: No focal deficit present.     Mental Status: He is alert and oriented to person, place, and time. Mental status is at baseline.  Psychiatric:        Mood and Affect: Mood normal.        Behavior: Behavior normal.        Thought Content: Thought content normal.        Judgment: Judgment normal.     BP (!) 158/77   Pulse 99   Temp 98 F (36.7 C) (Temporal)   Resp 18   Ht 5\' 7"  (1.702 m)   Wt 123 lb 6.4 oz (56 kg)   SpO2 99%   BMI 19.33 kg/m  Wt Readings from Last 3 Encounters:  02/25/20 123 lb 6.4 oz (56 kg)  02/20/20 124 lb (56.2 kg)  01/22/20 122 lb (55.3 kg)     There are no  preventive care reminders to display for this patient.  There are no preventive care reminders to display for this patient.  Lab Results  Component Value Date   TSH 4.270 02/20/2020   Lab Results  Component Value Date  WBC 4.2 11/29/2019   HGB 10.6 (L) 11/29/2019   HCT 32.6 (L) 11/29/2019   MCV 84.7 11/29/2019   PLT 239 11/29/2019   Lab Results  Component Value Date   NA 142 02/20/2020   K 3.9 02/20/2020   CO2 23 02/20/2020   GLUCOSE 98 02/20/2020   BUN 19 02/20/2020   CREATININE 1.46 (H) 02/20/2020   BILITOT 0.4 02/20/2020   ALKPHOS 92 02/20/2020   AST 14 02/20/2020   ALT 9 02/20/2020   PROT 6.3 02/20/2020   ALBUMIN 4.1 02/20/2020   CALCIUM 9.0 02/20/2020   ANIONGAP 8 11/29/2019   GFR 61.60 07/17/2018   Lab Results  Component Value Date   CHOL 141 02/20/2020   Lab Results  Component Value Date   HDL 31 (L) 02/20/2020   Lab Results  Component Value Date   LDLCALC 91 02/20/2020   Lab Results  Component Value Date   TRIG 104 02/20/2020   Lab Results  Component Value Date   CHOLHDL 4.5 02/20/2020   Lab Results  Component Value Date   HGBA1C 5.3 02/20/2020      Assessment & Plan:   Problem List Items Addressed This Visit    None    Visit Diagnoses    Medication refill    -  Primary      Meds ordered this encounter  Medications  . Cholecalciferol (VITAMIN D3) 50 MCG (2000 UT) TABS    Sig: Take 2,000 Units by mouth daily.    Dispense:  90 tablet    Refill:  3    Order Specific Question:   Supervising Provider    Answer:   Neva Seat, JEFFREY R [[id number]]    Follow-up: No follow-ups on file.   PLAN  Refill x 1 year  Return as scheduled for visit with Dr Neva Seat, PCP  Patient encouraged to call clinic with any questions, comments, or concerns.  Janeece Agee, NP

## 2020-02-27 ENCOUNTER — Encounter: Payer: Self-pay | Admitting: Radiology

## 2020-04-06 ENCOUNTER — Emergency Department (HOSPITAL_COMMUNITY)
Admission: EM | Admit: 2020-04-06 | Discharge: 2020-04-06 | Discharge status: Against Medical Advice | Payer: No Typology Code available for payment source | Attending: Emergency Medicine | Admitting: Emergency Medicine

## 2020-04-06 ENCOUNTER — Other Ambulatory Visit: Payer: Self-pay

## 2020-04-06 ENCOUNTER — Encounter (HOSPITAL_COMMUNITY): Payer: Self-pay

## 2020-04-06 ENCOUNTER — Emergency Department
Admission: EM | Admit: 2020-04-06 | Discharge: 2020-04-06 | Disposition: A | Discharge status: Home / Self Care | Payer: No Typology Code available for payment source | Attending: Emergency Medicine | Admitting: Emergency Medicine

## 2020-04-06 DIAGNOSIS — Z7902 Long term (current) use of antithrombotics/antiplatelets: Secondary | ICD-10-CM | POA: Diagnosis not present

## 2020-04-06 DIAGNOSIS — I10 Essential (primary) hypertension: Secondary | ICD-10-CM | POA: Diagnosis not present

## 2020-04-06 DIAGNOSIS — Z5321 Procedure and treatment not carried out due to patient leaving prior to being seen by health care provider: Secondary | ICD-10-CM | POA: Diagnosis not present

## 2020-04-06 DIAGNOSIS — S0592XA Unspecified injury of left eye and orbit, initial encounter: Secondary | ICD-10-CM | POA: Diagnosis present

## 2020-04-06 DIAGNOSIS — H5712 Ocular pain, left eye: Secondary | ICD-10-CM | POA: Insufficient documentation

## 2020-04-06 DIAGNOSIS — Z7982 Long term (current) use of aspirin: Secondary | ICD-10-CM | POA: Diagnosis not present

## 2020-04-06 DIAGNOSIS — H5452A1 Low vision left eye category 1, normal vision right eye: Secondary | ICD-10-CM | POA: Diagnosis not present

## 2020-04-06 DIAGNOSIS — Y33XXXA Other specified events, undetermined intent, initial encounter: Secondary | ICD-10-CM | POA: Insufficient documentation

## 2020-04-06 DIAGNOSIS — E039 Hypothyroidism, unspecified: Secondary | ICD-10-CM | POA: Insufficient documentation

## 2020-04-06 DIAGNOSIS — S0502XA Injury of conjunctiva and corneal abrasion without foreign body, left eye, initial encounter: Secondary | ICD-10-CM | POA: Insufficient documentation

## 2020-04-06 DIAGNOSIS — Z79899 Other long term (current) drug therapy: Secondary | ICD-10-CM | POA: Insufficient documentation

## 2020-04-06 MED ORDER — FLUORESCEIN SODIUM 1 MG OP STRP
1.0000 | ORAL_STRIP | Freq: Once | OPHTHALMIC | Status: AC
  Administered 2020-04-06: 1 via OPHTHALMIC
  Filled 2020-04-06: qty 1

## 2020-04-06 MED ORDER — TETRACAINE HCL 0.5 % OP SOLN: 1.0000 [drp] | Freq: Once | OPHTHALMIC | Status: DC

## 2020-04-06 MED ORDER — FLUORESCEIN SODIUM 1 MG OP STRP: 1.0000 | ORAL_STRIP | Freq: Once | OPHTHALMIC | Status: DC

## 2020-04-06 MED ORDER — TETRACAINE HCL 0.5 % OP SOLN
1.0000 [drp] | Freq: Once | OPHTHALMIC | Status: DC
  Filled 2020-04-06: qty 4

## 2020-04-06 MED ORDER — TOBRAMYCIN 0.3 % OP SOLN
1.0000 [drp] | OPHTHALMIC | 0 refills | Status: AC
Start: 2020-04-06 — End: 2020-04-16

## 2020-04-06 MED ORDER — TETRACAINE HCL 0.5 % OP SOLN
2.0000 [drp] | Freq: Once | OPHTHALMIC | Status: AC
  Administered 2020-04-06: 2 [drp] via OPHTHALMIC
  Filled 2020-04-06: qty 4

## 2020-04-06 MED ORDER — FLUORESCEIN SODIUM 1 MG OP STRP
1.0000 | ORAL_STRIP | Freq: Once | OPHTHALMIC | Status: DC
  Filled 2020-04-06: qty 1

## 2020-04-06 NOTE — ED Notes (Addendum)
PA and RN went into room and patient was no where to be found. Pt was not in any of the surrounding bathrooms. Triage RN reports seeing patient walk out the front door and has not come back. PA made aware and informed RN to switch disposition to AMA.

## 2020-04-06 NOTE — ED Provider Notes (Signed)
Patient initially seen by myself he is complaining of irritated left eye since yesterday.  I requested fluorescein dye and tetracaine and will be placed at bedside and returned to room approximately 20 minutes later to find the patient is gone.  Myself and RN search for patient and after waiting for patient to return AMA paperwork was filled out.  Was unable to conduct a physical exam prior to him leaving because of material in the room not being present.  He did appear to have somewhat injected sclera in the left eye.  Denies any vision changes.  Likely corneal abrasion or viral conjunctivitis.  Attempted to contact patient to no avail.  Patient was agreeable to plan prior to me leaving the room.  Uncertain why he left.   Gailen Shelter, [place] 04/06/20 1431    Sabas Sous, MD 04/06/20 [phone]

## 2020-04-06 NOTE — ED Triage Notes (Signed)
Pt comes pov with scratchy, red, irritated left eye since yesterday.

## 2020-04-06 NOTE — Discharge Instructions (Signed)
You have a scratch to her left eye.  Please begin the antibiotic eyedrops today.  Please call ophthalmology in the morning for a follow-up appointment this for a week to make sure that the scratch is healing.

## 2020-04-06 NOTE — ED Notes (Signed)
Woods lamp at bedside.  

## 2020-04-06 NOTE — ED Provider Notes (Signed)
[facility] Emergency Department Provider Note  ____________________________________________  Time seen: Approximately 2:50 PM  I have reviewed the triage vital signs and the nursing notes.   HISTORY  Chief Complaint Eye Problem    HPI Jeremy Merritt is a 85 y.o. male that presents to the emergency department for evaluation of red itchy left eye for 2 days.  Eye is not painful, only itchy.  Patient does not recall any trauma.  He does not wear any contacts.  He denies any blurry vision.  Patient went to [facility] this morning but states that they took too long and he felt they forgot about him so he left.  No visual changes, floaters, flashers.   Past Medical History:  Diagnosis Date  . Allergy   . Bilateral lower extremity edema 05/04/2019  . Hyperlipidemia   . Hypertension   . Thyroid disease     Patient Active Problem List   Diagnosis Date Noted  . 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 Hypothyroidism 05/22/2019  . Bilateral lower extremity edema 05/04/2019  . Essential hypertension 04/17/2019  . Dyslipidemia 04/17/2019  . Mesenteric ischemia (HCC) 04/17/2019  . Chronic mesenteric ischemia (HCC) 10/17/2018  . Chronic abdominal pain 10/17/2018    Past Surgical History:  Procedure Laterality Date  . COLONOSCOPY WITH PROPOFOL N/A 04/28/2019   Procedure: COLONOSCOPY WITH PROPOFOL;  Surgeon: Toney Reil, MD;  Location: [facility] ENDOSCOPY;  Service: Gastroenterology;  Laterality: N/A;  . ESOPHAGOGASTRODUODENOSCOPY (EGD) WITH PROPOFOL N/A 04/28/2019   Procedure: ESOPHAGOGASTRODUODENOSCOPY (EGD) WITH PROPOFOL;  Surgeon: Toney Reil, MD;  Location: [facility] ENDOSCOPY;  Service: Gastroenterology;  Laterality: N/A;  . INGUINAL HERNIA REPAIR    . VISCERAL ANGIOGRAPHY N/A 04/18/2019   Procedure: VISCERAL ANGIOGRAPHY;  Surgeon: Annice Needy, MD;  Location: [facility] INVASIVE CV LAB;  Service: Cardiovascular;  Laterality: N/A;    Prior to  Admission medications   Medication Sig Start Date End Date Taking? Authorizing Provider  aspirin EC 81 MG tablet Take 81 mg by mouth daily.    [provider]  Cholecalciferol (VITAMIN D3) 50 MCG (2000 UT) TABS Take 2,000 Units by mouth daily. 02/25/20   Janeece Agee, NP  clopidogrel (PLAVIX) 75 MG tablet Take 1 tablet (75 mg total) by mouth daily. 02/20/20   Shade Flood, MD  levothyroxine (SYNTHROID) 100 MCG tablet Take 1 tablet (100 mcg total) by mouth daily before breakfast. 02/20/20 05/20/20  Shade Flood, MD  lisinopril-hydrochlorothiazide (ZESTORETIC) 20-12.5 MG tablet Take 1 tablet by mouth daily.    [provider]  megestrol (MEGACE) 40 MG tablet Take 40 mg by mouth daily. 09/22/19   [provider]  omeprazole (PRILOSEC) 40 MG capsule Take 1 capsule (40 mg total) by mouth 2 (two) times daily. 04/29/19   Pennie Banter, DO  simvastatin (ZOCOR) 20 MG tablet Take 1 tablet (20 mg total) by mouth at bedtime. 02/20/20 05/20/20  Shade Flood, MD  tobramycin (TOBREX) 0.3 % ophthalmic solution Place 1 drop into the right eye every 4 (four) hours for 10 days. 04/06/20 04/16/20  Enid Derry, PA-C    Allergies Patient has no known allergies.  Family History  Problem Relation Age of Onset  . Other Mother        died of old age  . Other Father        Black lung  . Thyroid cancer Sister   . Kidney failure Brother        on dialysis  .  Colon cancer Neg Hx   . Liver cancer Neg Hx   . Stomach cancer Neg Hx     Social History Social History   Tobacco Use  . Smoking status: Never Smoker  . Smokeless tobacco: Never Used  Vaping Use  . Vaping Use: Never used  Substance Use Topics  . Alcohol use: No  . Drug use: No     Review of Systems  Cardiovascular: No chest pain. Respiratory: No SOB. Gastrointestinal: No abdominal pain.  No vomiting.  Musculoskeletal: Negative for musculoskeletal pain. Skin: Negative for rash, abrasions,  lacerations, ecchymosis. Neurological: Negative for headaches   ____________________________________________   PHYSICAL EXAM:  VITAL SIGNS: ED Triage Vitals  Enc Vitals Group     BP 04/06/20 1345 (!) 156/71     Pulse Rate 04/06/20 1345 (!) 102     Resp 04/06/20 1345 18     Temp 04/06/20 1345 98.9 F (37.2 C)     Temp Source 04/06/20 1345 Oral     SpO2 04/06/20 1345 99 %     Weight 04/06/20 1344 130 lb (59 kg)     Height 04/06/20 1344 5\' 7"  (1.702 m)     Head Circumference --      Peak Flow --      Pain Score 04/06/20 1344 3     Pain Loc --      Pain Edu? --      Excl. in GC? --      Constitutional: Alert and oriented. Well appearing and in no acute distress. Eyes: Left conjunctiva is mildly injected. PERRL. EOMI. Crusting to left eyelashes.  1 mm abrasion visualized on fluorescein stain to 3 PM position of left iris. Head: Atraumatic. ENT:      Ears:      Nose: No congestion/rhinnorhea.      Mouth/Throat: Mucous membranes are moist.  Neck: No stridor.  Cardiovascular: Normal rate, regular rhythm.  Good peripheral circulation. Respiratory: Normal respiratory effort without tachypnea or retractions. Lungs CTAB. Good air entry to the bases with no decreased or absent breath sounds. Musculoskeletal: Full range of motion to all extremities. No gross deformities appreciated. Neurologic:  Normal speech and language. No gross focal neurologic deficits are appreciated.  Skin:  Skin is warm, dry and intact. No rash noted. Psychiatric: Mood and affect are normal. Speech and behavior are normal. Patient exhibits appropriate insight and judgement.   ____________________________________________   LABS (all labs ordered are listed, but only abnormal results are displayed)  Labs Reviewed - No data to display ____________________________________________  EKG   ____________________________________________  RADIOLOGY   No results  found.  ____________________________________________    PROCEDURES  Procedure(s) performed:    Procedures    Medications  tetracaine (PONTOCAINE) 0.5 % ophthalmic solution 2 drop (has no administration in time range)  fluorescein ophthalmic strip 1 strip (has no administration in time range)     ____________________________________________   INITIAL IMPRESSION / ASSESSMENT AND PLAN / ED COURSE  Pertinent labs & imaging results that were available during my care of the patient were reviewed by me and considered in my medical decision making (see chart for details).  Review of the [place] CSRS was performed in accordance of the [facility] prior to dispensing any controlled drugs.   Patient's diagnosis is consistent with corneal abrasion.  Vital signs and exam are reassuring.  Exam is consistent with a corneal abrasion.  Patient now remembers that he did injure his eye with a piece of paper about 3 days  ago.  Patient denies any visual changes.  Patient will be discharged home with prescriptions for Tobrex. Patient is to follow up with ophthalmology as directed.  Referral was given.  Patient is given ED precautions to return to the ED for any worsening or new symptoms.  Jeremy Merritt was evaluated in Emergency Department on 04/06/2020 for the symptoms described in the history of present illness. He was evaluated in the context of the global COVID-19 pandemic, which necessitated consideration that the patient might be at risk for infection with the SARS-CoV-2 virus that causes COVID-19. Institutional protocols and algorithms that pertain to the evaluation of patients at risk for COVID-19 are in a state of rapid change based on information released by regulatory bodies including the CDC and federal and state organizations. These policies and algorithms were followed during the patient's care in the ED.   ____________________________________________  FINAL CLINICAL IMPRESSION(S) / ED  DIAGNOSES  Final diagnoses:  Abrasion of left cornea, initial encounter      NEW MEDICATIONS STARTED DURING THIS VISIT:  ED Discharge Orders         Ordered    tobramycin (TOBREX) 0.3 % ophthalmic solution  Every 4 hours        04/06/20 1523              This chart was dictated using voice recognition software/Dragon. Despite best efforts to proofread, errors can occur which can change the meaning. Any change was purely unintentional.    Enid Derry, PA-C 04/06/20 1550    Sharyn Creamer, MD 04/08/20 [phone]

## 2020-04-06 NOTE — ED Triage Notes (Signed)
Patient states he has redness and decreased vision of the left eye since yesterday. Patient states the left eye has a "scratchy feeling"

## 2020-04-09 DIAGNOSIS — S0502XA Injury of conjunctiva and corneal abrasion without foreign body, left eye, initial encounter: Secondary | ICD-10-CM | POA: Diagnosis not present

## 2020-04-10 DIAGNOSIS — S0502XD Injury of conjunctiva and corneal abrasion without foreign body, left eye, subsequent encounter: Secondary | ICD-10-CM | POA: Diagnosis not present

## 2020-04-14 DIAGNOSIS — B0052 Herpesviral keratitis: Secondary | ICD-10-CM | POA: Diagnosis not present

## 2020-04-17 DIAGNOSIS — B0052 Herpesviral keratitis: Secondary | ICD-10-CM | POA: Diagnosis not present

## 2020-04-24 DIAGNOSIS — B0052 Herpesviral keratitis: Secondary | ICD-10-CM | POA: Diagnosis not present

## 2020-05-13 ENCOUNTER — Emergency Department: Payer: No Typology Code available for payment source

## 2020-05-13 ENCOUNTER — Emergency Department
Admission: EM | Admit: 2020-05-13 | Discharge: 2020-05-13 | Disposition: A | Discharge status: Home / Self Care | Payer: No Typology Code available for payment source | Attending: Emergency Medicine | Admitting: Emergency Medicine

## 2020-05-13 ENCOUNTER — Other Ambulatory Visit: Payer: Self-pay

## 2020-05-13 ENCOUNTER — Encounter: Payer: Self-pay | Admitting: Intensive Care

## 2020-05-13 DIAGNOSIS — I10 Essential (primary) hypertension: Secondary | ICD-10-CM | POA: Diagnosis not present

## 2020-05-13 DIAGNOSIS — M542 Cervicalgia: Secondary | ICD-10-CM

## 2020-05-13 DIAGNOSIS — E039 Hypothyroidism, unspecified: Secondary | ICD-10-CM | POA: Diagnosis not present

## 2020-05-13 DIAGNOSIS — Z7982 Long term (current) use of aspirin: Secondary | ICD-10-CM | POA: Insufficient documentation

## 2020-05-13 DIAGNOSIS — Z79899 Other long term (current) drug therapy: Secondary | ICD-10-CM | POA: Diagnosis not present

## 2020-05-13 MED ORDER — LIDOCAINE 5 % EX PTCH
1.0000 | MEDICATED_PATCH | CUTANEOUS | Status: DC
  Administered 2020-05-13: 1 via TRANSDERMAL
  Filled 2020-05-13: qty 1

## 2020-05-13 NOTE — ED Provider Notes (Signed)
[facility] Emergency Department Provider Note  ____________________________________________  Time seen: Approximately 12:01 PM  I have reviewed the triage vital signs and the nursing notes.   HISTORY  Chief Complaint Neck Pain    HPI Jeremy Merritt is a 85 y.o. male that presents to the emergency department for evaluation of left-sided neck soreness and stiffness last night.  Pain was worse when he moved his head.  It improved this morning but had not completely resolved so he came to the emergency department.  When patient got to the emergency department, his symptoms completely resolved.  He has been here now for about 4 hours and has not had any additional pain or concerns.  He is asymptomatic currently.  He is eager for discharge.  No trauma.  No headache, dizziness, shortness of breath, chest pain.  Past Medical History:  Diagnosis Date  . Allergy   . Bilateral lower extremity edema 05/04/2019  . Hyperlipidemia   . Hypertension   . Thyroid disease     Patient Active Problem List   Diagnosis Date Noted  . 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 Hypothyroidism 05/22/2019  . Bilateral lower extremity edema 05/04/2019  . Essential hypertension 04/17/2019  . Dyslipidemia 04/17/2019  . Mesenteric ischemia (HCC) 04/17/2019  . Chronic mesenteric ischemia (HCC) 10/17/2018  . Chronic abdominal pain 10/17/2018    Past Surgical History:  Procedure Laterality Date  . COLONOSCOPY WITH PROPOFOL N/A 04/28/2019   Procedure: COLONOSCOPY WITH PROPOFOL;  Surgeon: Toney Reil, MD;  Location: [facility] ENDOSCOPY;  Service: Gastroenterology;  Laterality: N/A;  . ESOPHAGOGASTRODUODENOSCOPY (EGD) WITH PROPOFOL N/A 04/28/2019   Procedure: ESOPHAGOGASTRODUODENOSCOPY (EGD) WITH PROPOFOL;  Surgeon: Toney Reil, MD;  Location: [facility] ENDOSCOPY;  Service: Gastroenterology;  Laterality: N/A;  . INGUINAL HERNIA REPAIR    . VISCERAL ANGIOGRAPHY N/A  04/18/2019   Procedure: VISCERAL ANGIOGRAPHY;  Surgeon: Annice Needy, MD;  Location: [facility] INVASIVE CV LAB;  Service: Cardiovascular;  Laterality: N/A;    Prior to Admission medications   Medication Sig Start Date End Date Taking? Authorizing Provider  aspirin EC 81 MG tablet Take 81 mg by mouth daily.    [provider]  Cholecalciferol (VITAMIN D3) 50 MCG (2000 UT) TABS Take 2,000 Units by mouth daily. 02/25/20   Janeece Agee, NP  clopidogrel (PLAVIX) 75 MG tablet Take 1 tablet (75 mg total) by mouth daily. 02/20/20   Shade Flood, MD  levothyroxine (SYNTHROID) 100 MCG tablet Take 1 tablet (100 mcg total) by mouth daily before breakfast. 02/20/20 05/20/20  Shade Flood, MD  lisinopril-hydrochlorothiazide (ZESTORETIC) 20-12.5 MG tablet Take 1 tablet by mouth daily.    [provider]  megestrol (MEGACE) 40 MG tablet Take 40 mg by mouth daily. 09/22/19   [provider]  omeprazole (PRILOSEC) 40 MG capsule Take 1 capsule (40 mg total) by mouth 2 (two) times daily. 04/29/19   Pennie Banter, DO  simvastatin (ZOCOR) 20 MG tablet Take 1 tablet (20 mg total) by mouth at bedtime. 02/20/20 05/20/20  Shade Flood, MD    Allergies Patient has no known allergies.  Family History  Problem Relation Age of Onset  . Other Mother        died of old age  . Other Father        Black lung  . Thyroid cancer Sister   . Kidney failure Brother        on dialysis  . Colon cancer Neg Hx   .  Liver cancer Neg Hx   . Stomach cancer Neg Hx     Social History Social History   Tobacco Use  . Smoking status: Never Smoker  . Smokeless tobacco: Never Used  Vaping Use  . Vaping Use: Never used  Substance Use Topics  . Alcohol use: No  . Drug use: No     Review of Systems  Constitutional: No fever/chills ENT: No upper respiratory complaints. Cardiovascular: No chest pain. Respiratory: No cough. No SOB. Gastrointestinal: No abdominal pain.  No nausea, no  vomiting.  Musculoskeletal: Positive for neck pain this morning. Skin: Negative for rash, abrasions, lacerations, ecchymosis. Neurological: Negative for headaches, numbness or tingling   ____________________________________________   PHYSICAL EXAM:  VITAL SIGNS: ED Triage Vitals  Enc Vitals Group     BP 05/13/20 0911 140/62     Pulse Rate 05/13/20 0911 (!) 106     Resp 05/13/20 0911 20     Temp 05/13/20 0911 98.3 F (36.8 C)     Temp Source 05/13/20 0911 Oral     SpO2 05/13/20 0911 99 %     Weight 05/13/20 0911 130 lb (59 kg)     Height 05/13/20 0911 5' 7.5" (1.715 m)     Head Circumference --      Peak Flow --      Pain Score 05/13/20 0915 7     Pain Loc --      Pain Edu? --      Excl. in GC? --      Constitutional: Alert and oriented. Well appearing and in no acute distress. Eyes: Conjunctivae are normal. PERRL. EOMI. Head: Atraumatic. ENT:      Ears:      Nose: No congestion/rhinnorhea.      Mouth/Throat: Mucous membranes are moist.  Neck: No stridor.  No cervical spine tenderness to palpation.  Patient points to the trapezius as area of discomfort last night.  Full range of motion of neck. Cardiovascular: Normal rate, regular rhythm.  Good peripheral circulation. Respiratory: Normal respiratory effort without tachypnea or retractions. Lungs CTAB. Good air entry to the bases with no decreased or absent breath sounds. Gastrointestinal: Soft and nontender to palpation. No guarding or rigidity. No palpable masses. No distention. Musculoskeletal: Full range of motion to all extremities. No gross deformities appreciated. Neurologic:  Normal speech and language. No gross focal neurologic deficits are appreciated.  Skin:  Skin is warm, dry and intact. No rash noted. Psychiatric: Mood and affect are normal. Speech and behavior are normal. Patient exhibits appropriate insight and judgement.   ____________________________________________   LABS (all labs ordered are  listed, but only abnormal results are displayed)  Labs Reviewed - No data to display ____________________________________________  EKG  SR ____________________________________________  RADIOLOGY Lexine Baton, personally viewed and evaluated these images (plain radiographs) as part of my medical decision making, as well as reviewing the written report by the radiologist.  DG Cervical Spine 2-3 Views  Result Date: 05/13/2020 CLINICAL DATA:  Neck pain EXAM: CERVICAL SPINE - 2-3 VIEW COMPARISON:  None FINDINGS: Disc space narrowing and spurring at C5-6 and C6-7. Normal alignment. No fracture. Prevertebral soft tissues are normal. Degenerative facet disease bilaterally. Carotid artery calcifications bilaterally, left greater than right. IMPRESSION: Degenerative disc and facet disease as above. No acute bony abnormality. Carotid artery calcifications. Electronically Signed   By: Charlett Nose M.D.   On: 05/13/2020 11:04    ____________________________________________    PROCEDURES  Procedure(s) performed:    Procedures  Medications  lidocaine (LIDODERM) 5 % 1 patch (1 patch Transdermal Patch Applied 05/13/20 1205)     ____________________________________________   INITIAL IMPRESSION / ASSESSMENT AND PLAN / ED COURSE  Pertinent labs & imaging results that were available during my care of the patient were reviewed by me and considered in my medical decision making (see chart for details).  Review of the [place] CSRS was performed in accordance of the [facility] prior to dispensing any controlled drugs.    Patient presented to the emergency department for evaluation of neck pain last night, improving this morning.  Vital signs and exam are reassuring.  Patient is completely asymptomatic now and has been here for the entire 4-1/2 hours that he has been in the emergency department.  He denies any pain or concerns currently.  He is eagerly requesting discharge.   Patient is to follow up  with primary care as directed. Patient is given ED precautions to return to the ED for any worsening or new symptoms.   Jeremy Merritt was evaluated in Emergency Department on 05/13/2020 for the symptoms described in the history of present illness. He was evaluated in the context of the global COVID-19 pandemic, which necessitated consideration that the patient might be at risk for infection with the SARS-CoV-2 virus that causes COVID-19. Institutional protocols and algorithms that pertain to the evaluation of patients at risk for COVID-19 are in a state of rapid change based on information released by regulatory bodies including the CDC and federal and state organizations. These policies and algorithms were followed during the patient's care in the ED.  ____________________________________________  FINAL CLINICAL IMPRESSION(S) / ED DIAGNOSES  Final diagnoses:  Neck pain      NEW MEDICATIONS STARTED DURING THIS VISIT:  ED Discharge Orders    None          This chart was dictated using voice recognition software/Dragon. Despite best efforts to proofread, errors can occur which can change the meaning. Any change was purely unintentional.    Enid Derry, PA-C 05/13/20 1601    Delton Prairie, MD 05/14/20 [phone]

## 2020-05-13 NOTE — Discharge Instructions (Addendum)
Please call your primary care today for a follow-up appointment today or tomorrow.  Please return to the emergency department if your neck pain worsens.

## 2020-05-13 NOTE — ED Triage Notes (Signed)
PAtient c/o neck pain that started yesterday evening. Reports pain eased up some today but still painful. LEft side of neck worse. Able to move neck around

## 2020-05-14 ENCOUNTER — Ambulatory Visit: Payer: Medicare PPO | Admitting: Registered Nurse

## 2020-05-15 ENCOUNTER — Encounter: Payer: Self-pay | Admitting: Family Medicine

## 2020-05-15 ENCOUNTER — Ambulatory Visit: Payer: Medicare PPO | Admitting: Family Medicine

## 2020-05-15 ENCOUNTER — Other Ambulatory Visit: Payer: Self-pay

## 2020-05-15 VITALS — BP 134/82 | HR 116 | Temp 97.4°F | Ht 67.5 in | Wt 122.0 lb

## 2020-05-15 DIAGNOSIS — M62838 Other muscle spasm: Secondary | ICD-10-CM

## 2020-05-15 DIAGNOSIS — M542 Cervicalgia: Secondary | ICD-10-CM | POA: Diagnosis not present

## 2020-05-15 NOTE — Patient Instructions (Addendum)
It appears that you have some spasm in the neck muscle which may be related to some of the wear that was seen on your x-ray.  Tylenol 500 mg up to 4 times per day as needed for pain.  Heat or ice to the muscle on the side of the neck may also help with some of the spasm, and gentle range of motion throughout the day.  If symptoms do not continue to improve in the next week follow-up for recheck.Return to the clinic or go to the nearest emergency room if any of your symptoms worsen or new symptoms occur.   If you have lab work done today you will be contacted with your lab results within the next 2 weeks.  If you have not heard from [place] then please contact us. The fastest way to get your results is to register for My Chart.   IF you received an x-ray today, you will receive an invoice from [facility]. Please contact [facility] at [phone] with questions or concerns regarding your invoice.   IF you received labwork today, you will receive an invoice from [facility]. Please contact [facility] at [phone] with questions or concerns regarding your invoice.   Our billing staff will not be able to assist you with questions regarding bills from these companies.  You will be contacted with the lab results as soon as they are available. The fastest way to get your results is to activate your My Chart account. Instructions are located on the last page of this paperwork. If you have not heard from [place] regarding the results in 2 weeks, please contact this office.

## 2020-05-15 NOTE — Progress Notes (Signed)
Subjective:  Patient ID: Jeremy Merritt, male    DOB: 1934-11-07  Age: 85 y.o. MRN: [id number]  CC:  Chief Complaint  Patient presents with  . Hospitalization Follow-up    PT went to the ED on 05/13/2020. Pt was not admitted. Pt had x-rays taken while he was there. Pt reports his neck isn't as sore since the hospital visit, but pt still feels something there. PT states the soreness/stiffness is located on the L side of his neck. PT reports it hurts when he moves his neck a little.    HPI Jeremy Merritt presents for   Hospital/ER follow-up Evaluated December 7 at [facility] ER for left-sided neck soreness and stiffness, pain with movement of head.- noticed when waking up, no injury. No new activity.   Improved that morning.  Symptoms had resolved by the time he was seen in the ER.  He did have a cervical spine x-ray indicated degenernative disc and facet disease but no acute bony abnormality.  Carotid artery calcifications.  Stiffness in left side still there, but not as bad as initially. No arm or chest radiation. No arm weakness.   No treatments.   Has not had covid vaccine - does not plan to get vaccine.    History Patient Active Problem List   Diagnosis Date Noted  . 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 Hypothyroidism 05/22/2019  . Bilateral lower extremity edema 05/04/2019  . Essential hypertension 04/17/2019  . Dyslipidemia 04/17/2019  . Mesenteric ischemia (HCC) 04/17/2019  . Chronic mesenteric ischemia (HCC) 10/17/2018  . Chronic abdominal pain 10/17/2018   Past Medical History:  Diagnosis Date  . Allergy   . Bilateral lower extremity edema 05/04/2019  . Hyperlipidemia   . Hypertension   . Thyroid disease    Past Surgical History:  Procedure Laterality Date  . COLONOSCOPY WITH PROPOFOL N/A 04/28/2019   Procedure: COLONOSCOPY WITH PROPOFOL;  Surgeon: Toney Reil, MD;  Location: [facility] ENDOSCOPY;  Service: Gastroenterology;   Laterality: N/A;  . ESOPHAGOGASTRODUODENOSCOPY (EGD) WITH PROPOFOL N/A 04/28/2019   Procedure: ESOPHAGOGASTRODUODENOSCOPY (EGD) WITH PROPOFOL;  Surgeon: Toney Reil, MD;  Location: [facility] ENDOSCOPY;  Service: Gastroenterology;  Laterality: N/A;  . INGUINAL HERNIA REPAIR    . VISCERAL ANGIOGRAPHY N/A 04/18/2019   Procedure: VISCERAL ANGIOGRAPHY;  Surgeon: Annice Needy, MD;  Location: [facility] INVASIVE CV LAB;  Service: Cardiovascular;  Laterality: N/A;   No Known Allergies Prior to Admission medications   Medication Sig Start Date End Date Taking? Authorizing Provider  aspirin EC 81 MG tablet Take 81 mg by mouth daily.   Yes [provider]  Cholecalciferol (VITAMIN D3) 50 MCG (2000 UT) TABS Take 2,000 Units by mouth daily. 02/25/20  Yes Janeece Agee, NP  clopidogrel (PLAVIX) 75 MG tablet Take 1 tablet (75 mg total) by mouth daily. 02/20/20  Yes Shade Flood, MD  levothyroxine (SYNTHROID) 100 MCG tablet Take 1 tablet (100 mcg total) by mouth daily before breakfast. 02/20/20 05/20/20 Yes Shade Flood, MD  lisinopril-hydrochlorothiazide (ZESTORETIC) 20-12.5 MG tablet Take 1 tablet by mouth daily.   Yes [provider]  omeprazole (PRILOSEC) 40 MG capsule Take 1 capsule (40 mg total) by mouth 2 (two) times daily. 04/29/19  Yes Esaw Grandchild A, DO  simvastatin (ZOCOR) 20 MG tablet Take 1 tablet (20 mg total) by mouth at bedtime. 02/20/20 05/20/20 Yes Shade Flood, MD  megestrol (MEGACE) 40 MG tablet Take 40 mg by mouth daily. Patient not taking: Reported  on 05/15/2020 09/22/19   [provider]   Social History   Socioeconomic History  . Marital status: Widowed    Spouse name: Not on file  . Number of children: 2  . Years of education: Not on file  . Highest education level: Not on file  Occupational History  . Occupation: retired  Tobacco Use  . Smoking status: Never Smoker  . Smokeless tobacco: Never Used  Vaping Use  . Vaping Use: Never used   Substance and Sexual Activity  . Alcohol use: No  . Drug use: No  . Sexual activity: Never  Other Topics Concern  . Not on file  Social History Narrative  . Not on file   Social Determinants of Health   Financial Resource Strain: Not on file  Food Insecurity: Not on file  Transportation Needs: Not on file  Physical Activity: Not on file  Stress: Not on file  Social Connections: Not on file  Intimate Partner Violence: Not on file    Review of Systems Per HPI  Objective:   Vitals:   05/15/20 0930  BP: 134/82  Pulse: (!) 116  Temp: (!) 97.4 F (36.3 C)  TempSrc: Temporal  SpO2: 98%  Weight: 122 lb (55.3 kg)  Height: 5' 7.5" (1.715 m)     Physical Exam Vitals reviewed.  Constitutional:      General: He is not in acute distress.    Appearance: He is well-developed and well-nourished.  HENT:     Head: Normocephalic and atraumatic.  Neck:     Vascular: No carotid bruit.  Cardiovascular:     Rate and Rhythm: Normal rate.     Comments: normal HR on exam.  Pulmonary:     Effort: Pulmonary effort is normal.  Musculoskeletal:     Comments: C-spine: No midline bony tenderness, some tenderness with slight spasm of the left paraspinal muscles, reproducing area of discomfort, but mild.  Range of motion limited with rotation and lateral flexion bilaterally.  Arm strength is equal bilaterally, grip strength equal bilaterally, reflexes 2+ at biceps, triceps, brachioradialis bilaterally.  Neurological:     Mental Status: He is alert and oriented to person, place, and time.  Psychiatric:        Mood and Affect: Mood and affect normal.       Assessment & Plan:  Jeremy Merritt is a 85 y.o. male . Neck muscle spasm  Neck pain Suspected paraspinal spasm with degenerative changes noted on C-spine imaging.  No known injury, symptoms are improving.  Symptomatic care discussed with Tylenol, heat or ice.  Will avoid muscle relaxants at this time given potential side  effects and age, but RTC precautions given.  Understanding expressed.  No orders of the defined types were placed in this encounter.  Patient Instructions   It appears that you have some spasm in the neck muscle which may be related to some of the wear that was seen on your x-ray.  Tylenol 500 mg up to 4 times per day as needed for pain.  Heat or ice to the muscle on the side of the neck may also help with some of the spasm, and gentle range of motion throughout the day.  If symptoms do not continue to improve in the next week follow-up for recheck.Return to the clinic or go to the nearest emergency room if any of your symptoms worsen or new symptoms occur.   If you have lab work done today you will be contacted with  your lab results within the next 2 weeks.  If you have not heard from [place] then please contact us. The fastest way to get your results is to register for My Chart.   IF you received an x-ray today, you will receive an invoice from [facility]. Please contact [facility] at [phone] with questions or concerns regarding your invoice.   IF you received labwork today, you will receive an invoice from [facility]. Please contact [facility] at [phone] with questions or concerns regarding your invoice.   Our billing staff will not be able to assist you with questions regarding bills from these companies.  You will be contacted with the lab results as soon as they are available. The fastest way to get your results is to activate your My Chart account. Instructions are located on the last page of this paperwork. If you have not heard from [place] regarding the results in 2 weeks, please contact this office.         Signed, Meredith Staggers, MD [facility]

## 2020-06-02 ENCOUNTER — Ambulatory Visit: Payer: Medicare PPO | Admitting: Emergency Medicine

## 2020-06-04 ENCOUNTER — Telehealth: Payer: Self-pay | Admitting: Family Medicine

## 2020-06-04 NOTE — Telephone Encounter (Signed)
Called pt per his CRM unable to leave voice  mail .unable to leave voice mail mail box is full

## 2020-06-17 ENCOUNTER — Telehealth: Payer: Self-pay | Admitting: Family Medicine

## 2020-06-17 NOTE — Telephone Encounter (Signed)
Pt has brought in Renewal of Disability Placard for Neva Seat to fill out. I have placed paperwork at nurse's station for Dr. Neva Seat. Please advise pt when ready at [phone].

## 2020-06-19 DIAGNOSIS — K551 Chronic vascular disorders of intestine: Secondary | ICD-10-CM | POA: Diagnosis not present

## 2020-06-19 DIAGNOSIS — Z8711 Personal history of peptic ulcer disease: Secondary | ICD-10-CM | POA: Diagnosis not present

## 2020-06-19 DIAGNOSIS — K626 Ulcer of anus and rectum: Secondary | ICD-10-CM | POA: Diagnosis not present

## 2020-06-19 NOTE — Telephone Encounter (Signed)
Given to Dr Neva Seat

## 2020-06-24 ENCOUNTER — Telehealth: Payer: Self-pay | Admitting: Family Medicine

## 2020-06-24 NOTE — Telephone Encounter (Signed)
Patient called to report that we called and he cant remember why    Please advise

## 2020-07-22 ENCOUNTER — Other Ambulatory Visit: Payer: Self-pay

## 2020-07-22 ENCOUNTER — Ambulatory Visit (INDEPENDENT_AMBULATORY_CARE_PROVIDER_SITE_OTHER): Payer: Medicare PPO | Admitting: Vascular Surgery

## 2020-07-22 ENCOUNTER — Other Ambulatory Visit (INDEPENDENT_AMBULATORY_CARE_PROVIDER_SITE_OTHER): Payer: Self-pay

## 2020-07-22 ENCOUNTER — Ambulatory Visit (INDEPENDENT_AMBULATORY_CARE_PROVIDER_SITE_OTHER): Payer: Medicare PPO

## 2020-07-22 ENCOUNTER — Encounter (INDEPENDENT_AMBULATORY_CARE_PROVIDER_SITE_OTHER): Payer: Self-pay | Admitting: Vascular Surgery

## 2020-07-22 VITALS — BP 161/86 | HR 88 | Resp 16 | Wt 122.0 lb

## 2020-07-22 DIAGNOSIS — I10 Essential (primary) hypertension: Secondary | ICD-10-CM | POA: Diagnosis not present

## 2020-07-22 DIAGNOSIS — J8410 Pulmonary fibrosis, unspecified: Secondary | ICD-10-CM | POA: Insufficient documentation

## 2020-07-22 DIAGNOSIS — E782 Mixed hyperlipidemia: Secondary | ICD-10-CM | POA: Insufficient documentation

## 2020-07-22 DIAGNOSIS — E785 Hyperlipidemia, unspecified: Secondary | ICD-10-CM

## 2020-07-22 DIAGNOSIS — Z0000 Encounter for general adult medical examination without abnormal findings: Secondary | ICD-10-CM | POA: Insufficient documentation

## 2020-07-22 DIAGNOSIS — K559 Vascular disorder of intestine, unspecified: Secondary | ICD-10-CM | POA: Diagnosis not present

## 2020-07-22 DIAGNOSIS — K551 Chronic vascular disorders of intestine: Secondary | ICD-10-CM | POA: Diagnosis not present

## 2020-07-22 NOTE — Progress Notes (Signed)
MRN : [id number]  Jeremy Merritt is a 86 y.o. ([birth year]) male who presents with chief complaint of  Chief Complaint  Patient presents with  . Follow-up    6 month ultrasound follow up  .  History of Present Illness: Patient returns today in follow up of his chronic mesenteric ischemia.  A little over a year ago, he underwent SMA stent placement for severe chronic mesenteric ischemia and an emaciated patient who is down to about 100 pounds.  He is eating well now with no abdominal pain.  He is up to 122 pounds.  He has no chronic visceral symptoms currently. Duplex today shows mildly elevated velocities in the SMA stent that may be stenosis although they are not markedly elevated.  His celiac artery velocities remain quite high and clearly in the high-grade range.   Current Outpatient Medications  Medication Sig Dispense Refill  . aspirin EC 81 MG tablet Take 81 mg by mouth daily.    . Cholecalciferol (VITAMIN D3) 50 MCG (2000 UT) TABS Take 2,000 Units by mouth daily. 90 tablet 3  . clopidogrel (PLAVIX) 75 MG tablet Take 1 tablet (75 mg total) by mouth daily. 90 tablet 1  . levothyroxine (SYNTHROID) 100 MCG tablet Take 1 tablet (100 mcg total) by mouth daily before breakfast. 90 tablet 1  . lisinopril-hydrochlorothiazide (ZESTORETIC) 20-12.5 MG tablet Take 1 tablet by mouth daily.    Marland Kitchen omeprazole (PRILOSEC) 40 MG capsule Take 1 capsule (40 mg total) by mouth 2 (two) times daily. 60 capsule 2  . simvastatin (ZOCOR) 20 MG tablet Take 1 tablet (20 mg total) by mouth at bedtime. 90 tablet 1   No current facility-administered medications for this visit.    Past Medical History:  Diagnosis Date  . Allergy   . Bilateral lower extremity edema 05/04/2019  . Hyperlipidemia   . Hypertension   . Thyroid disease     Past Surgical History:  Procedure Laterality Date  . COLONOSCOPY WITH PROPOFOL N/A 04/28/2019   Procedure: COLONOSCOPY WITH PROPOFOL;  Surgeon: Toney Reil, MD;   Location: [facility] ENDOSCOPY;  Service: Gastroenterology;  Laterality: N/A;  . ESOPHAGOGASTRODUODENOSCOPY (EGD) WITH PROPOFOL N/A 04/28/2019   Procedure: ESOPHAGOGASTRODUODENOSCOPY (EGD) WITH PROPOFOL;  Surgeon: Toney Reil, MD;  Location: [facility] ENDOSCOPY;  Service: Gastroenterology;  Laterality: N/A;  . INGUINAL HERNIA REPAIR    . VISCERAL ANGIOGRAPHY N/A 04/18/2019   Procedure: VISCERAL ANGIOGRAPHY;  Surgeon: Annice Needy, MD;  Location: [facility] INVASIVE CV LAB;  Service: Cardiovascular;  Laterality: N/A;     Social History   Tobacco Use  . Smoking status: Never Smoker  . Smokeless tobacco: Never Used  Vaping Use  . Vaping Use: Never used  Substance Use Topics  . Alcohol use: No  . Drug use: No      Family History  Problem Relation Age of Onset  . Other Mother        died of old age  . Other Father        Black lung  . Thyroid cancer Sister   . Kidney failure Brother        on dialysis  . Colon cancer Neg Hx   . Liver cancer Neg Hx   . Stomach cancer Neg Hx     No Known Allergies   REVIEW OF SYSTEMS(Negative unless checked)  Constitutional: [x] ??Weight loss[] ??Fever[] ??Chills Cardiac:[] ??Chest pain[] ??Chest pressure[] ??Palpitations [] ??Shortness of breath when laying flat [] ??Shortness of breath at rest [] ??Shortness of breath with exertion. Vascular: [] ??Pain in  legs with walking[] ??Pain in legsat rest[] ??Pain in legs when laying flat [] ??Claudication [] ??Pain in feet when walking [] ??Pain in feet at rest [] ??Pain in feet when laying flat [] ??History of DVT [] ??Phlebitis [] ??Swelling in legs [] ??Varicose veins [] ??Non-healing ulcers Pulmonary: [] ??Uses home oxygen [] ??Productive cough[] ??Hemoptysis [] ??Wheeze [] ??COPD [] ??Asthma Neurologic: [] ??Dizziness [] ??Blackouts [] ??Seizures [] ??History of stroke [] ??History of TIA[] ??Aphasia [] ??Temporary blindness[] ??Dysphagia [] ??Weaknessor numbness in  arms [] ??Weakness or numbnessin legs Musculoskeletal: [x] ??Arthritis [] ??Joint swelling [x] ??Joint pain [] ??Low back pain Hematologic:[] ??Easy bruising[] ??Easy bleeding [] ??Hypercoagulable state [] ??Anemic [] ??Hepatitis Gastrointestinal:[x] ??Blood in stool[] ??Vomiting blood[x] ??Gastroesophageal reflux/heartburn[x] ??Abdominal pain Genitourinary: [] ??Chronic kidney disease [] ??Difficulturination [] ??Frequenturination [] ??Burning with urination[] ??Hematuria Skin: [] ??Rashes [] ??Ulcers [] ??Wounds Psychological: [] ??History of anxiety[] ??History of major depression.  Physical Examination  BP (!) 161/86 (BP Location: Right Arm)   Pulse 88   Resp 16   Wt 122 lb (55.3 kg)   BMI 18.83 kg/m  Gen:  WD/WN, NAD Head: [place]/AT, No temporalis wasting. Ear/Nose/Throat: Hearing grossly intact, nares w/o erythema or drainage Eyes: Conjunctiva clear. Sclera non-icteric Neck: Supple.  Trachea midline Pulmonary:  Good air movement, no use of accessory muscles.  Cardiac: irregular Vascular:  Vessel Right Left  Radial Palpable Palpable           Gastrointestinal: soft, non-tender/non-distended. No guarding/reflex.  Musculoskeletal: M/S 5/5 throughout.  No deformity or atrophy. No edema. Neurologic: Sensation grossly intact in extremities.  Symmetrical.  Speech is fluent.  Psychiatric: Judgment intact, Mood & affect appropriate for pt's clinical situation. Dermatologic: No rashes or ulcers noted.  No cellulitis or open wounds.       Labs No results found for this or any previous visit (from the past 2160 hour(s)).  Radiology No results found.  Assessment/Plan Essential hypertension blood pressure control important in reducing the progression of atherosclerotic disease. On appropriate oral medications.   Hyperlipidemia lipid control important in reducing the progression of atherosclerotic disease. Continue statin therapy  No  problem-specific Assessment & Plan notes found for this encounter.    , MD  07/22/2020 11:31 AM    This note was created with Dragon medical transcription system.  Any errors from dictation are purely unintentional

## 2020-07-22 NOTE — Assessment & Plan Note (Signed)
Duplex today shows mildly elevated velocities in the SMA stent that may be stenosis although they are not markedly elevated.  His celiac artery velocities remain quite high and clearly in the high-grade range.  As he is not having any symptoms to speak of, we will continue to monitor this with duplex.  He will continue his current medical regimen.  I will see him back in 6 months.

## 2020-08-20 ENCOUNTER — Other Ambulatory Visit: Payer: Self-pay

## 2020-08-20 ENCOUNTER — Telehealth (INDEPENDENT_AMBULATORY_CARE_PROVIDER_SITE_OTHER): Payer: Medicare PPO | Admitting: Family Medicine

## 2020-08-20 ENCOUNTER — Encounter: Payer: Self-pay | Admitting: Family Medicine

## 2020-08-20 VITALS — Ht 67.5 in | Wt 127.0 lb

## 2020-08-20 DIAGNOSIS — E039 Hypothyroidism, unspecified: Secondary | ICD-10-CM

## 2020-08-20 DIAGNOSIS — I10 Essential (primary) hypertension: Secondary | ICD-10-CM | POA: Diagnosis not present

## 2020-08-20 DIAGNOSIS — E785 Hyperlipidemia, unspecified: Secondary | ICD-10-CM

## 2020-08-20 DIAGNOSIS — R7989 Other specified abnormal findings of blood chemistry: Secondary | ICD-10-CM | POA: Diagnosis not present

## 2020-08-20 DIAGNOSIS — K551 Chronic vascular disorders of intestine: Secondary | ICD-10-CM

## 2020-08-20 MED ORDER — SIMVASTATIN 20 MG PO TABS
20.0000 mg | ORAL_TABLET | Freq: Every day | ORAL | 1 refills | Status: DC
Start: 2020-08-20 — End: 2022-06-18

## 2020-08-20 MED ORDER — LISINOPRIL-HYDROCHLOROTHIAZIDE 20-12.5 MG PO TABS: 1.0000 | ORAL_TABLET | Freq: Every day | ORAL | 1 refills | Status: DC

## 2020-08-20 MED ORDER — LEVOTHYROXINE SODIUM 100 MCG PO TABS: 100.0000 ug | ORAL_TABLET | Freq: Every day | ORAL | 1 refills | Status: AC

## 2020-08-20 MED ORDER — CLOPIDOGREL BISULFATE 75 MG PO TABS: 75.0000 mg | ORAL_TABLET | Freq: Every day | ORAL | 1 refills | Status: AC

## 2020-08-20 MED ORDER — BLOOD PRESSURE MONITOR DEVI: 0 refills | Status: DC

## 2020-08-20 NOTE — Progress Notes (Signed)
Virtual Visit via Telephone Note  I connected with Jeremy Merritt on 08/20/20 at 3:07 PM - he is at [facility] at this time - requested call in next few hrs. Contacted at 8pm. by telephone and verified that I am speaking with the correct person using two identifiers. Patient location:home My location: home.    I discussed the limitations, risks, security and privacy concerns of performing an evaluation and management service by telephone and the availability of in person appointments. I also discussed with the patient that there may be a patient responsible charge related to this service. The patient expressed understanding and agreed to proceed, consent obtained  Chief complaint:  Chief Complaint  Patient presents with  . Follow-up    On Hypothyroidism, hyperlipidemia, and hypertension. Pt reports no issues with these conditions that he has noticed. Pt reports no physical symptoms of these conditions. Pt states his current medications seem to work well with no side effects. Pt is requesting an at home BP monitor so he can check his BP more often at home instead of going to [facility] when he wants to check it.    History of Present Illness: Jeremy Merritt is a 86 y.o. male  Hypertension: Slight elevation at recent visit with Dr. Wyn Quaker with vein and vascular for follow-up of chronic mesenteric ischemia.  History of SMA stent placement.  Duplex at 2/15 visit showed mildly elevated velocities in the SMA stent but not markedly elevated.  Celiac artery velocities were high and in the high-grade range.  As asymptomatic, plan was to continue monitoring with duplex and current med regimen, including lipid and BP control to limit the progression of atherosclerotic disease.  Continues on Plavix 75 mg daily Also suspect component of CKD with creatinine 1.37-1.51 range in 2021.  Previously 0.89 in November 2020. Lisinopril HCTZ 20/12.5 mg daily Home readings: none.  No new side effects.  Needs BP  machine.   Constitutional: Negative for fatigue and unexpected weight change.  Eyes: Negative for visual disturbance.  Respiratory: Negative for cough, chest tightness and shortness of breath.   Cardiovascular: Negative for chest pain, palpitations and leg swelling.  Gastrointestinal: Negative for abdominal pain and blood in stool.  Neurological: Negative for dizziness, light-headedness and headaches.     BP Readings from Last 3 Encounters:  07/22/20 (!) 161/86  05/15/20 134/82  05/13/20 140/62   Lab Results  Component Value Date   CREATININE 1.46 (H) 02/20/2020    Hypothyroidism: Lab Results  Component Value Date   TSH 4.270 02/20/2020  Taking medication daily.  Synthroid 100 mcg daily.  No new hot or cold intolerance. No new hair or skin changes, heart palpitations or new fatigue. No new weight changes.    Hyperlipidemia: Simvastatin 20 mg daily.  Lab Results  Component Value Date   CHOL 141 02/20/2020   HDL 31 (L) 02/20/2020   LDLCALC 91 02/20/2020   TRIG 104 02/20/2020   CHOLHDL 4.5 02/20/2020   Lab Results  Component Value Date   ALT 9 02/20/2020   AST 14 02/20/2020   ALKPHOS 92 02/20/2020   BILITOT 0.4 02/20/2020        Patient Active Problem List   Diagnosis Date Noted  . Mixed hyperlipidemia 07/22/2020  . Pulmonary fibrosis (HCC) 07/22/2020  . Routine general medical examination at a health care facility 07/22/2020  . 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 Hypothyroidism 05/22/2019  . Lateral epicondylitis of elbow 05/22/2019  . Bilateral lower extremity edema 05/04/2019  .  Essential hypertension 04/17/2019  . Dyslipidemia 04/17/2019  . Mesenteric ischemia (HCC) 04/17/2019  . Chronic mesenteric ischemia (HCC) 10/17/2018  . Chronic abdominal pain 10/17/2018   Past Medical History:  Diagnosis Date  . Allergy   . Bilateral lower extremity edema 05/04/2019  . Hyperlipidemia   . Hypertension   . Thyroid disease    Past Surgical History:   Procedure Laterality Date  . COLONOSCOPY WITH PROPOFOL N/A 04/28/2019   Procedure: COLONOSCOPY WITH PROPOFOL;  Surgeon: Toney Reil, MD;  Location: [facility] ENDOSCOPY;  Service: Gastroenterology;  Laterality: N/A;  . ESOPHAGOGASTRODUODENOSCOPY (EGD) WITH PROPOFOL N/A 04/28/2019   Procedure: ESOPHAGOGASTRODUODENOSCOPY (EGD) WITH PROPOFOL;  Surgeon: Toney Reil, MD;  Location: [facility] ENDOSCOPY;  Service: Gastroenterology;  Laterality: N/A;  . INGUINAL HERNIA REPAIR    . VISCERAL ANGIOGRAPHY N/A 04/18/2019   Procedure: VISCERAL ANGIOGRAPHY;  Surgeon: Annice Needy, MD;  Location: [facility] INVASIVE CV LAB;  Service: Cardiovascular;  Laterality: N/A;   No Known Allergies Prior to Admission medications   Medication Sig Start Date End Date Taking? Authorizing Provider  aspirin EC 81 MG tablet Take 81 mg by mouth daily.   Yes [provider]  Cholecalciferol (VITAMIN D3) 50 MCG (2000 UT) TABS Take 2,000 Units by mouth daily. 02/25/20  Yes Janeece Agee, NP  clopidogrel (PLAVIX) 75 MG tablet Take 1 tablet (75 mg total) by mouth daily. 02/20/20  Yes Shade Flood, MD  lisinopril-hydrochlorothiazide (ZESTORETIC) 20-12.5 MG tablet Take 1 tablet by mouth daily.   Yes [provider]  omeprazole (PRILOSEC) 40 MG capsule Take 1 capsule (40 mg total) by mouth 2 (two) times daily. 04/29/19  Yes Pennie Banter, DO  levothyroxine (SYNTHROID) 100 MCG tablet Take 1 tablet (100 mcg total) by mouth daily before breakfast. 02/20/20 05/20/20  Shade Flood, MD  simvastatin (ZOCOR) 20 MG tablet Take 1 tablet (20 mg total) by mouth at bedtime. 02/20/20 05/20/20  Shade Flood, MD   Social History   Socioeconomic History  . Marital status: Widowed    Spouse name: Not on file  . Number of children: 2  . Years of education: Not on file  . Highest education level: Not on file  Occupational History  . Occupation: retired  Tobacco Use  . Smoking status: Never Smoker  . Smokeless  tobacco: Never Used  Vaping Use  . Vaping Use: Never used  Substance and Sexual Activity  . Alcohol use: No  . Drug use: No  . Sexual activity: Never  Other Topics Concern  . Not on file  Social History Narrative  . Not on file   Social Determinants of Health   Financial Resource Strain: Not on file  Food Insecurity: Not on file  Transportation Needs: Not on file  Physical Activity: Not on file  Stress: Not on file  Social Connections: Not on file  Intimate Partner Violence: Not on file     Observations/Objective: Vitals:   08/20/20 1205  Weight: 127 lb (57.6 kg)  Height: 5' 7.5" (1.715 m)  Speaking in full sentences, no distress.  Understanding expressed of plan, all questions were answered.   Assessment and Plan: Chronic mesenteric ischemia (HCC) - Plan: clopidogrel (PLAVIX) 75 MG tablet  -We will continue Plavix for now but will need to clarify with his vascular specialist if that is to be continued as question of whether he has been advised by other provider to stop Plavix.  Denies any bleeding.  Asymptomatic with mesenteric ischemia, discussion as above  with vascular.  Dyslipidemia - Plan: simvastatin (ZOCOR) 20 MG tablet  -Continue same dose Zocor  Essential hypertension - Plan: Blood Pressure Monitor DEVI, Comprehensive metabolic panel  -Tolerating current regimen.  Home blood pressure monitoring recommended, monitor ordered.  Check labs, nurse visit.   Hypothyroidism, unspecified type - Plan: Lipid panel, TSH, levothyroxine (SYNTHROID) 100 MCG tablet  -No new symptoms, check labs, continue Synthroid same dose.  Elevated serum creatinine  -Check labs.  Avoid nephrotoxins.  Follow Up Instructions:   Nurse visit within the next 1 week, 42-month follow-up if labs/BP stable.  I discussed the assessment and treatment plan with the patient. The patient was provided an opportunity to ask questions and all were answered. The patient agreed with the plan and  demonstrated an understanding of the instructions.   The patient was advised to call back or seek an in-person evaluation if the symptoms worsen or if the condition fails to improve as anticipated.  I provided 11 minutes of non-face-to-face time during this encounter with chart review.   Signed,   Meredith Staggers, MD [facility].  08/20/20

## 2020-08-20 NOTE — Patient Instructions (Addendum)
° ° ° °  If you have lab work done today you will be contacted with your lab results within the next 2 weeks.  If you have not heard from us then please contact us. The fastest way to get your results is to register for My Chart. ° ° °IF you received an x-ray today, you will receive an invoice from [facility]. Please contact  Radiology at [phone] with questions or concerns regarding your invoice.  ° °IF you received labwork today, you will receive an invoice from [facility]. Please contact [facility] at [phone] with questions or concerns regarding your invoice.  ° °Our billing staff will not be able to assist you with questions regarding bills from these companies. ° °You will be contacted with the lab results as soon as they are available. The fastest way to get your results is to activate your My Chart account. Instructions are located on the last page of this paperwork. If you have not heard from us regarding the results in 2 weeks, please contact this office. °  ° ° ° °

## 2020-08-22 ENCOUNTER — Telehealth: Payer: Self-pay | Admitting: Family Medicine

## 2020-08-22 ENCOUNTER — Telehealth (INDEPENDENT_AMBULATORY_CARE_PROVIDER_SITE_OTHER): Payer: Self-pay

## 2020-08-22 ENCOUNTER — Other Ambulatory Visit: Payer: Self-pay

## 2020-08-22 ENCOUNTER — Ambulatory Visit (INDEPENDENT_AMBULATORY_CARE_PROVIDER_SITE_OTHER): Payer: Medicare PPO | Admitting: Family Medicine

## 2020-08-22 VITALS — BP 145/82

## 2020-08-22 DIAGNOSIS — I10 Essential (primary) hypertension: Secondary | ICD-10-CM

## 2020-08-22 NOTE — Telephone Encounter (Signed)
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

## 2020-08-22 NOTE — Telephone Encounter (Signed)
Called patient per request from clinical / scheduled lab visit for today BP check

## 2020-08-22 NOTE — Telephone Encounter (Signed)
Called pt and spoke with him about this information. Pt seemed to understand

## 2020-08-22 NOTE — Patient Instructions (Signed)

## 2020-08-22 NOTE — Telephone Encounter (Signed)
Patients friend called back Rick Duff) not on DPR   Is calling back to clarify that t it was his heart doctor at the [facility] clinic that took him off his blood thinners .  He states patient is a little confused.   He may be able to bring patient today

## 2020-08-22 NOTE — Telephone Encounter (Signed)
Patient called and he was seen at [facility] on Monday and Wednesday He has had 2 ekgs and lab work and already had BP checked .they looked his meds over and told him to stop taking his blood thinners.   Patient concerned about the Blood thinners / he will ask Dr Gerlene Fee / His [facility] Doc told him to stop taking his blood thinners .   Please advise

## 2020-08-22 NOTE — Telephone Encounter (Signed)
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

## 2020-09-04 DIAGNOSIS — E7800 Pure hypercholesterolemia, unspecified: Secondary | ICD-10-CM | POA: Diagnosis not present

## 2020-09-04 DIAGNOSIS — Z8719 Personal history of other diseases of the digestive system: Secondary | ICD-10-CM | POA: Diagnosis not present

## 2020-09-04 DIAGNOSIS — I10 Essential (primary) hypertension: Secondary | ICD-10-CM | POA: Diagnosis not present

## 2020-09-04 DIAGNOSIS — I6523 Occlusion and stenosis of bilateral carotid arteries: Secondary | ICD-10-CM | POA: Diagnosis not present

## 2020-09-04 DIAGNOSIS — E039 Hypothyroidism, unspecified: Secondary | ICD-10-CM | POA: Diagnosis not present

## 2020-09-04 DIAGNOSIS — K551 Chronic vascular disorders of intestine: Secondary | ICD-10-CM | POA: Diagnosis not present

## 2020-09-11 ENCOUNTER — Encounter (HOSPITAL_COMMUNITY): Payer: Self-pay

## 2020-09-11 ENCOUNTER — Other Ambulatory Visit: Payer: Self-pay

## 2020-09-11 ENCOUNTER — Emergency Department (HOSPITAL_COMMUNITY)
Admission: EM | Admit: 2020-09-11 | Discharge: 2020-09-11 | Disposition: A | Discharge status: Home / Self Care | Payer: No Typology Code available for payment source | Attending: Emergency Medicine | Admitting: Emergency Medicine

## 2020-09-11 ENCOUNTER — Emergency Department (HOSPITAL_COMMUNITY): Payer: No Typology Code available for payment source

## 2020-09-11 DIAGNOSIS — R531 Weakness: Secondary | ICD-10-CM | POA: Diagnosis not present

## 2020-09-11 DIAGNOSIS — Z5321 Procedure and treatment not carried out due to patient leaving prior to being seen by health care provider: Secondary | ICD-10-CM | POA: Insufficient documentation

## 2020-09-11 DIAGNOSIS — R21 Rash and other nonspecific skin eruption: Secondary | ICD-10-CM | POA: Diagnosis not present

## 2020-09-11 DIAGNOSIS — J9811 Atelectasis: Secondary | ICD-10-CM | POA: Diagnosis not present

## 2020-09-11 DIAGNOSIS — D869 Sarcoidosis, unspecified: Secondary | ICD-10-CM | POA: Diagnosis not present

## 2020-09-11 DIAGNOSIS — J90 Pleural effusion, not elsewhere classified: Secondary | ICD-10-CM | POA: Diagnosis not present

## 2020-09-11 LAB — BASIC METABOLIC PANEL
Anion gap: 10 (ref 5–15)
BUN: 25 mg/dL — ABNORMAL HIGH (ref 8–23)
CO2: 23 mmol/L (ref 22–32)
Calcium: 9.1 mg/dL (ref 8.9–10.3)
Chloride: 108 mmol/L (ref 98–111)
Creatinine, Ser: 1.74 mg/dL — ABNORMAL HIGH (ref 0.61–1.24)
GFR, Estimated: 38 mL/min — ABNORMAL LOW (ref >60)
Glucose, Bld: 126 mg/dL — ABNORMAL HIGH (ref 70–99)
Potassium: 4 mmol/L (ref 3.5–5.1)
Sodium: 141 mmol/L (ref 135–145)

## 2020-09-11 LAB — CBC
HCT: 35.2 % — ABNORMAL LOW (ref 39.0–52.0)
Hemoglobin: 11.7 g/dL — ABNORMAL LOW (ref 13.0–17.0)
MCH: 29.4 pg (ref 26.0–34.0)
MCHC: 33.2 g/dL (ref 30.0–36.0)
MCV: 88.4 fL (ref 80.0–100.0)
Platelets: 273 10*3/uL (ref 150–400)
RBC: 3.98 MIL/uL — ABNORMAL LOW (ref 4.22–5.81)
RDW: 14.6 % (ref 11.5–15.5)
WBC: 4.2 10*3/uL (ref 4.0–10.5)
nRBC: 0 % (ref 0.0–0.2)

## 2020-09-11 LAB — HEPATIC FUNCTION PANEL
ALT: 10 U/L (ref 0–44)
AST: 18 U/L (ref 15–41)
Albumin: 4 g/dL (ref 3.5–5.0)
Alkaline Phosphatase: 88 U/L (ref 38–126)
Bilirubin, Direct: <0.1 mg/dL (ref 0.0–0.2)
Total Bilirubin: 0.7 mg/dL (ref 0.3–1.2)
Total Protein: 7.3 g/dL (ref 6.5–8.1)

## 2020-09-11 LAB — TROPONIN I (HIGH SENSITIVITY): Troponin I (High Sensitivity): 4 ng/L (ref <18)

## 2020-09-11 LAB — CBG MONITORING, ED: Glucose-Capillary: 121 mg/dL — ABNORMAL HIGH (ref 70–99)

## 2020-09-11 LAB — TSH: TSH: 4.33 u[IU]/mL (ref 0.350–4.500)

## 2020-09-11 NOTE — ED Triage Notes (Signed)
Patient c/o weakness x 2 days.

## 2020-09-11 NOTE — ED Notes (Signed)
Went to see pt and he stated he wanted to leave. Asked pt if he wanted to see the doctor and he said no and that he would come back another day. Pt ambulatory from room to the lobby on his own.

## 2020-09-11 NOTE — ED Triage Notes (Signed)
Emergency Medicine Provider Triage Evaluation Note  Jeremy Merritt , a 86 y.o. male  was evaluated in triage.  Pt complains of weakness, fatigue for the past two days "felt like I needed to be checked out". He did report an episode last night while in bed when he began to breath out into sweats, felt like something was happening but did not have chest pain at the time. No prior CAD, no family Cad. Not on any blood thinners. States he currently lives alone.   Review of Systems  Positive: Weakness,  Negative: Fever, dizziness, headache, chest pain, shortness of breath  Physical Exam  BP (!) 147/87 (BP Location: Right Arm)   Pulse (!) 108   Temp 98.9 F (37.2 C) (Oral)   Resp 16   Ht 5\' 7"  (1.702 m)   Wt 59 kg   SpO2 100%   BMI 20.36 kg/m  Gen:   Awake, no distress   HEENT:  Atraumatic  Resp:  Normal effort Cardiac:  Normal rate Abd:   Nondistended, nontender MSK:   Moves extremities without difficulty Neuro:  Speech clear   Medical Decision Making  Medically screening exam initiated at 5:08 PM.  Appropriate orders placed.  was informed that the remainder of the evaluation will be completed by another provider, this initial triage assessment does not replace that evaluation, and the importance of remaining in the ED until their evaluation is complete.  Clinical Impression  Here with complaints of generalized weakness which began two days ago. No fever, no medication changes, no chest pain, no shortness of breath, no headache.   Hyman Bower, PA-C 09/11/20 1713

## 2020-09-17 ENCOUNTER — Other Ambulatory Visit: Payer: Self-pay | Admitting: Physician Assistant

## 2020-09-17 DIAGNOSIS — I6523 Occlusion and stenosis of bilateral carotid arteries: Secondary | ICD-10-CM

## 2020-09-24 DIAGNOSIS — I6523 Occlusion and stenosis of bilateral carotid arteries: Secondary | ICD-10-CM | POA: Diagnosis not present

## 2020-09-24 DIAGNOSIS — I6502 Occlusion and stenosis of left vertebral artery: Secondary | ICD-10-CM | POA: Diagnosis not present

## 2020-09-26 ENCOUNTER — Encounter (INDEPENDENT_AMBULATORY_CARE_PROVIDER_SITE_OTHER): Payer: Medicare PPO

## 2020-09-26 ENCOUNTER — Ambulatory Visit (INDEPENDENT_AMBULATORY_CARE_PROVIDER_SITE_OTHER): Payer: Medicare PPO | Admitting: Nurse Practitioner

## 2020-10-01 ENCOUNTER — Encounter (INDEPENDENT_AMBULATORY_CARE_PROVIDER_SITE_OTHER): Payer: Self-pay | Admitting: Nurse Practitioner

## 2020-10-14 ENCOUNTER — Encounter (INDEPENDENT_AMBULATORY_CARE_PROVIDER_SITE_OTHER): Payer: Self-pay | Admitting: Vascular Surgery

## 2020-10-14 ENCOUNTER — Telehealth (INDEPENDENT_AMBULATORY_CARE_PROVIDER_SITE_OTHER): Payer: Self-pay

## 2020-10-14 ENCOUNTER — Other Ambulatory Visit: Payer: Self-pay

## 2020-10-14 ENCOUNTER — Ambulatory Visit (INDEPENDENT_AMBULATORY_CARE_PROVIDER_SITE_OTHER): Payer: Medicare PPO | Admitting: Vascular Surgery

## 2020-10-14 VITALS — BP 142/73 | HR 123 | Ht 67.0 in | Wt 119.0 lb

## 2020-10-14 DIAGNOSIS — Z87440 Personal history of urinary (tract) infections: Secondary | ICD-10-CM | POA: Insufficient documentation

## 2020-10-14 DIAGNOSIS — I10 Essential (primary) hypertension: Secondary | ICD-10-CM

## 2020-10-14 DIAGNOSIS — K559 Vascular disorder of intestine, unspecified: Secondary | ICD-10-CM | POA: Diagnosis not present

## 2020-10-14 DIAGNOSIS — I639 Cerebral infarction, unspecified: Secondary | ICD-10-CM | POA: Insufficient documentation

## 2020-10-14 DIAGNOSIS — E782 Mixed hyperlipidemia: Secondary | ICD-10-CM | POA: Diagnosis not present

## 2020-10-14 DIAGNOSIS — I6523 Occlusion and stenosis of bilateral carotid arteries: Secondary | ICD-10-CM | POA: Diagnosis not present

## 2020-10-14 DIAGNOSIS — E7800 Pure hypercholesterolemia, unspecified: Secondary | ICD-10-CM | POA: Insufficient documentation

## 2020-10-14 DIAGNOSIS — R000 Tachycardia, unspecified: Secondary | ICD-10-CM | POA: Insufficient documentation

## 2020-10-14 NOTE — Assessment & Plan Note (Signed)
blood pressure control important in reducing the progression of atherosclerotic disease. On appropriate oral medications.  

## 2020-10-14 NOTE — Assessment & Plan Note (Signed)
The patient has undergone a carotid duplex which I have reviewed.  This demonstrates a likely occlusion of the right carotid artery although trickle flow through the right carotid system may be present and could not be definitively discerned on the ultrasound.  There is calcific plaque in the left carotid artery with the velocities would fall in the less than 50% range by duplex criteria. Given this finding, I would recommend a catheter-based angiogram for further evaluation of his carotid disease.  If anatomy is suitable for carotid artery stenting at the time of angiography, this could be considered.  Due to the dense calcific nature described on the ultrasound I suspect this is less likely.  I would also plan to evaluate his left carotid circulation as there could be more significant disease there that was not seen well on ultrasound.  The patient and his family voiced their understanding.  Risks and benefits of this procedure were discussed and they are agreeable to proceed.

## 2020-10-14 NOTE — Telephone Encounter (Signed)
Spoke with the patient and his son and he is scheduled with Dr. Wyn Quaker for a carotid angio poss right stent placement on 10/20/20 with a 10:45 am arrival time to the MM. Pre-procedure instructions were discussed and handed to the patient.

## 2020-10-14 NOTE — Progress Notes (Signed)
Patient ID: Jeremy Merritt, male   DOB: [birth year], 86 y.o.   MRN: [id number]  Chief Complaint  Patient presents with  . Follow-up    Per referral from [facility] . Carotid US results from [facility]      HPI Jeremy Merritt is a 86 y.o. male.  I am asked to see the patient by Eliane Decree for evaluation of carotid stenosis.  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  He did not have any obvious focal neurologic deficits, but his primary care physician astutely ordered a carotid duplex which I have reviewed.  This demonstrates a likely occlusion of the right carotid artery although trickle flow through the right carotid system may be present and could not be definitively discerned on the ultrasound.  There is calcific plaque in the left carotid artery with the velocities would fall in the less than 50% range by duplex criteria.  Given these findings, he is referred for further evaluation and treatment.     Past Medical History:  Diagnosis Date  . Allergy   . Bilateral lower extremity edema 05/04/2019  . Hyperlipidemia   . Hypertension   . Thyroid disease     Past Surgical History:  Procedure Laterality Date  . COLONOSCOPY WITH PROPOFOL N/A 04/28/2019   Procedure: COLONOSCOPY WITH PROPOFOL;  Surgeon: Toney Reil, MD;  Location: [facility] ENDOSCOPY;  Service: Gastroenterology;  Laterality: N/A;  . ESOPHAGOGASTRODUODENOSCOPY (EGD) WITH PROPOFOL N/A 04/28/2019   Procedure: ESOPHAGOGASTRODUODENOSCOPY (EGD) WITH PROPOFOL;  Surgeon: Toney Reil, MD;  Location: [facility] ENDOSCOPY;  Service: Gastroenterology;  Laterality: N/A;  . INGUINAL HERNIA REPAIR    . VISCERAL ANGIOGRAPHY N/A 04/18/2019   Procedure: VISCERAL ANGIOGRAPHY;  Surgeon: Annice Needy, MD;  Location: [facility] INVASIVE CV LAB;  Service: Cardiovascular;  Laterality: N/A;     Family History  Problem Relation Age of Onset  . Other Mother         died of old age  . Other Father        Black lung  . Thyroid cancer Sister   . Kidney failure Brother        on dialysis  . Colon cancer Neg Hx   . Liver cancer Neg Hx   . Stomach cancer Neg Hx      Social History   Tobacco Use  . Smoking status: Never Smoker  . Smokeless tobacco: Never Used  Vaping Use  . Vaping Use: Never used  Substance Use Topics  . Alcohol use: No  . Drug use: No    No Known Allergies  Current Outpatient Medications  Medication Sig Dispense Refill  . aspirin EC 81 MG tablet Take 81 mg by mouth daily.    . Blood Pressure Monitor DEVI Use as directed. 1 each 0  . Cholecalciferol (VITAMIN D3) 50 MCG (2000 UT) TABS Take 2,000 Units by mouth daily. 90 tablet 3  . clopidogrel (PLAVIX) 75 MG tablet Take 1 tablet (75 mg total) by mouth daily. 90 tablet 1  . levothyroxine (SYNTHROID) 100 MCG tablet Take 1 tablet (100 mcg total) by mouth daily before breakfast. 90 tablet 1  . lisinopril-hydrochlorothiazide (ZESTORETIC) 20-12.5 MG tablet Take 1 tablet by mouth daily. 90 tablet 1  . omeprazole (PRILOSEC) 40 MG capsule Take 1 capsule (40 mg total) by mouth 2 (two) times daily. 60 capsule 2  . simvastatin (ZOCOR) 20 MG tablet Take 1 tablet (  20 mg total) by mouth at bedtime. 90 tablet 1  . Vitamin D, Ergocalciferol, 50 MCG (2000 UT) CAPS 1 capsule     No current facility-administered medications for this visit.      REVIEW OF SYSTEMS (Negative unless checked)  Constitutional: [x]Weight loss  []Fever  []Chills Cardiac: []Chest pain   []Chest pressure   [x]Palpitations   []Shortness of breath when laying flat   []Shortness of breath at rest   [x]Shortness of breath with exertion. Vascular:  []Pain in legs with walking   []Pain in legs at rest   []Pain in legs when laying flat   []Claudication   []Pain in feet when walking  []Pain in feet at rest  []Pain in feet when laying flat   []History of DVT   []Phlebitis   []Swelling in legs   []Varicose veins   []Non-healing  ulcers Pulmonary:   []Uses home oxygen   []Productive cough   []Hemoptysis   []Wheeze  []COPD   []Asthma Neurologic:  [x]Dizziness  []Blackouts   []Seizures   []History of stroke   []History of TIA  []Aphasia   []Temporary blindness   []Dysphagia   []Weakness or numbness in arms   []Weakness or numbness in legs Musculoskeletal:  [x]Arthritis   []Joint swelling   [x]Joint pain   []Low back pain Hematologic:  []Easy bruising  []Easy bleeding   []Hypercoagulable state   []Anemic  []Hepatitis Gastrointestinal:  []Blood in stool   []Vomiting blood  []Gastroesophageal reflux/heartburn   []Abdominal pain Genitourinary:  []Chronic kidney disease   []Difficult urination  []Frequent urination  []Burning with urination   []Hematuria Skin:  []Rashes   []Ulcers   []Wounds Psychological:  []History of anxiety   [] History of major depression.    Physical Exam BP (!) 142/73   Pulse (!) 123   Ht 5' 7" (1.702 m)   Wt 119 lb (54 kg)   BMI 18.64 kg/m  Gen: Thin, somewhat frail, NAD Head: [place]/AT, + temporalis wasting. Ear/Nose/Throat: Hearing grossly intact, nares w/o erythema or drainage, oropharynx w/o Erythema/Exudate Eyes: Conjunctiva clear, sclera non-icteric  Neck: trachea midline.  No JVD.  Pulmonary:  Good air movement, respirations not labored, no use of accessory muscles  Cardiac: RRR, no JVD Vascular:  Vessel Right Left  Radial Palpable Palpable                                   Gastrointestinal:. No masses, surgical incisions, or scars. Musculoskeletal: M/S 5/5 throughout.  Extremities without ischemic changes.  No deformity or atrophy.  Mild LE edema. Neurologic: Sensation grossly intact in extremities.  Symmetrical.  Speech is fluent. Motor exam as listed above. Psychiatric: Judgment intact, Mood & affect appropriate for pt's clinical situation. Dermatologic: No rashes or ulcers noted.  No cellulitis or open wounds.    Radiology No results found.  Labs Recent Results (from  the past 2160 hour(s))  CBG monitoring, ED     Status: Abnormal   Collection Time: 09/11/20  5:27 PM  Result Value Ref Range   Glucose-Capillary 121 (H) 70 - 99 mg/dL    Comment: Glucose reference range applies only to samples taken after fasting for at least 8 hours.  Basic metabolic panel     Status: Abnormal   Collection Time: 09/11/20  5:30 PM  Result Value Ref Range   Sodium 141 135 - 145 mmol/L   Potassium 4.0 3.5 -   5.1 mmol/L   Chloride 108 98 - 111 mmol/L   CO2 23 22 - 32 mmol/L   Glucose, Bld 126 (H) 70 - 99 mg/dL    Comment: Glucose reference range applies only to samples taken after fasting for at least 8 hours.   BUN 25 (H) 8 - 23 mg/dL   Creatinine, Ser 1.32 (H) 0.61 - 1.24 mg/dL   Calcium 9.1 8.9 - 44.0 mg/dL   GFR, Estimated 38 (L) >60 mL/min    Comment: (NOTE) Calculated using the CKD-EPI Creatinine Equation (2021)    Anion gap 10 5 - 15    Comment: Performed at [facility], [address] [place], [place] [address]  CBC     Status: Abnormal   Collection Time: 09/11/20  5:30 PM  Result Value Ref Range   WBC 4.2 4.0 - 10.5 K/uL   RBC 3.98 (L) 4.22 - 5.81 MIL/uL   Hemoglobin 11.7 (L) 13.0 - 17.0 g/dL   HCT 53.6 (L) 64.4 - 03.4 %   MCV 88.4 80.0 - 100.0 fL   MCH 29.4 26.0 - 34.0 pg   MCHC 33.2 30.0 - 36.0 g/dL   RDW 74.2 59.5 - 63.8 %   Platelets 273 150 - 400 K/uL   nRBC 0.0 0.0 - 0.2 %    Comment: Performed at [facility], [address] [place] [address] [place], [place] [address]  TSH     Status: None   Collection Time: 09/11/20  5:30 PM  Result Value Ref Range   TSH 4.330 0.350 - 4.500 uIU/mL    Comment: Performed by a 3rd Generation assay with a functional sensitivity of <=0.01 uIU/mL. Performed at [facility], [address], [place], [place] [address]   Hepatic function panel     Status: None   Collection Time: 09/11/20  5:30 PM  Result Value Ref Range   Total Protein 7.3 6.5 - 8.1 g/dL   Albumin 4.0 3.5  - 5.0 g/dL   AST 18 15 - 41 U/L   ALT 10 0 - 44 U/L   Alkaline Phosphatase 88 38 - 126 U/L   Total Bilirubin 0.7 0.3 - 1.2 mg/dL   Bilirubin, Direct <8.8 0.0 - 0.2 mg/dL   Indirect Bilirubin NOT CALCULATED 0.3 - 0.9 mg/dL    Comment: Performed at [facility], [address] [place], [place] [address]  Troponin I (High Sensitivity)     Status: None   Collection Time: 09/11/20  5:30 PM  Result Value Ref Range   Troponin I (High Sensitivity) 4 <18 ng/L    Comment: (NOTE) Elevated high sensitivity troponin I (hsTnI) values and significant  changes across serial measurements may suggest ACS but many other  chronic and acute conditions are known to elevate hsTnI results.  Refer to the Links section for chest pain algorithms and additional  guidance. Performed at [facility], [address] [place], [place] [address]     Assessment/Plan:  Mesenteric ischemia [facility]) Previously treated with mesenteric intervention with significant improvement in his abdominal pain and now some weight gain.  This was checked earlier this year and was stable.  We will continue to follow this.  Essential hypertension blood pressure control important in reducing the progression of atherosclerotic disease. On appropriate oral medications.   Occlusion and stenosis of bilateral carotid arteries The patient has undergone a carotid duplex which I have reviewed.  This demonstrates a likely occlusion of the right carotid artery  although trickle flow through the right carotid system may be present and could not be definitively discerned on the ultrasound.  There is calcific plaque in the left carotid artery with the velocities would fall in the less than 50% range by duplex criteria. Given this finding, I would recommend a catheter-based angiogram for further evaluation of his carotid disease.  If anatomy is suitable for carotid artery stenting at the time of angiography, this  could be considered.  Due to the dense calcific nature described on the ultrasound I suspect this is less likely.  I would also plan to evaluate his left carotid circulation as there could be more significant disease there that was not seen well on ultrasound.  The patient and his family voiced their understanding.  Risks and benefits of this procedure were discussed and they are agreeable to proceed.  Mixed hyperlipidemia lipid control important in reducing the progression of atherosclerotic disease. Continue statin therapy       [name] 10/14/2020, 11:08 AM   This note was created with Dragon medical transcription system.  Any errors from dictation are unintentional.

## 2020-10-14 NOTE — H&P (View-Only) (Signed)
Patient ID: Jeremy Merritt, male   DOB: [birth year], 86 y.o.   MRN: [id number]  Chief Complaint  Patient presents with  . Follow-up    Per referral from [facility] . Carotid US results from [facility]      HPI Jeremy Merritt is a 86 y.o. male.  I am asked to see the patient by Eliane Decree for evaluation of carotid stenosis.  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  He did not have any obvious focal neurologic deficits, but his primary care physician astutely ordered a carotid duplex which I have reviewed.  This demonstrates a likely occlusion of the right carotid artery although trickle flow through the right carotid system may be present and could not be definitively discerned on the ultrasound.  There is calcific plaque in the left carotid artery with the velocities would fall in the less than 50% range by duplex criteria.  Given these findings, he is referred for further evaluation and treatment.     Past Medical History:  Diagnosis Date  . Allergy   . Bilateral lower extremity edema 05/04/2019  . Hyperlipidemia   . Hypertension   . Thyroid disease     Past Surgical History:  Procedure Laterality Date  . COLONOSCOPY WITH PROPOFOL N/A 04/28/2019   Procedure: COLONOSCOPY WITH PROPOFOL;  Surgeon: Toney Reil, MD;  Location: [facility] ENDOSCOPY;  Service: Gastroenterology;  Laterality: N/A;  . ESOPHAGOGASTRODUODENOSCOPY (EGD) WITH PROPOFOL N/A 04/28/2019   Procedure: ESOPHAGOGASTRODUODENOSCOPY (EGD) WITH PROPOFOL;  Surgeon: Toney Reil, MD;  Location: [facility] ENDOSCOPY;  Service: Gastroenterology;  Laterality: N/A;  . INGUINAL HERNIA REPAIR    . VISCERAL ANGIOGRAPHY N/A 04/18/2019   Procedure: VISCERAL ANGIOGRAPHY;  Surgeon: Annice Needy, MD;  Location: [facility] INVASIVE CV LAB;  Service: Cardiovascular;  Laterality: N/A;     Family History  Problem Relation Age of Onset  . Other Mother         died of old age  . Other Father        Black lung  . Thyroid cancer Sister   . Kidney failure Brother        on dialysis  . Colon cancer Neg Hx   . Liver cancer Neg Hx   . Stomach cancer Neg Hx      Social History   Tobacco Use  . Smoking status: Never Smoker  . Smokeless tobacco: Never Used  Vaping Use  . Vaping Use: Never used  Substance Use Topics  . Alcohol use: No  . Drug use: No    No Known Allergies  Current Outpatient Medications  Medication Sig Dispense Refill  . aspirin EC 81 MG tablet Take 81 mg by mouth daily.    . Blood Pressure Monitor DEVI Use as directed. 1 each 0  . Cholecalciferol (VITAMIN D3) 50 MCG (2000 UT) TABS Take 2,000 Units by mouth daily. 90 tablet 3  . clopidogrel (PLAVIX) 75 MG tablet Take 1 tablet (75 mg total) by mouth daily. 90 tablet 1  . levothyroxine (SYNTHROID) 100 MCG tablet Take 1 tablet (100 mcg total) by mouth daily before breakfast. 90 tablet 1  . lisinopril-hydrochlorothiazide (ZESTORETIC) 20-12.5 MG tablet Take 1 tablet by mouth daily. 90 tablet 1  . omeprazole (PRILOSEC) 40 MG capsule Take 1 capsule (40 mg total) by mouth 2 (two) times daily. 60 capsule 2  . simvastatin (ZOCOR) 20 MG tablet Take 1 tablet (  20 mg total) by mouth at bedtime. 90 tablet 1  . Vitamin D, Ergocalciferol, 50 MCG (2000 UT) CAPS 1 capsule     No current facility-administered medications for this visit.      REVIEW OF SYSTEMS (Negative unless checked)  Constitutional: [x] Weight loss  [] Fever  [] Chills Cardiac: [] Chest pain   [] Chest pressure   [x] Palpitations   [] Shortness of breath when laying flat   [] Shortness of breath at rest   [x] Shortness of breath with exertion. Vascular:  [] Pain in legs with walking   [] Pain in legs at rest   [] Pain in legs when laying flat   [] Claudication   [] Pain in feet when walking  [] Pain in feet at rest  [] Pain in feet when laying flat   [] History of DVT   [] Phlebitis   [] Swelling in legs   [] Varicose veins   [] Non-healing  ulcers Pulmonary:   [] Uses home oxygen   [] Productive cough   [] Hemoptysis   [] Wheeze  [] COPD   [] Asthma Neurologic:  [x] Dizziness  [] Blackouts   [] Seizures   [] History of stroke   [] History of TIA  [] Aphasia   [] Temporary blindness   [] Dysphagia   [] Weakness or numbness in arms   [] Weakness or numbness in legs Musculoskeletal:  [x] Arthritis   [] Joint swelling   [x] Joint pain   [] Low back pain Hematologic:  [] Easy bruising  [] Easy bleeding   [] Hypercoagulable state   [] Anemic  [] Hepatitis Gastrointestinal:  [] Blood in stool   [] Vomiting blood  [] Gastroesophageal reflux/heartburn   [] Abdominal pain Genitourinary:  [] Chronic kidney disease   [] Difficult urination  [] Frequent urination  [] Burning with urination   [] Hematuria Skin:  [] Rashes   [] Ulcers   [] Wounds Psychological:  [] History of anxiety   []  History of major depression.    Physical Exam BP (!) 142/73   Pulse (!) 123   Ht 5\' 7"  (1.702 m)   Wt 119 lb (54 kg)   BMI 18.64 kg/m  Gen: Thin, somewhat frail, NAD Head: /AT, + temporalis wasting. Ear/Nose/Throat: Hearing grossly intact, nares w/o erythema or drainage, oropharynx w/o Erythema/Exudate Eyes: Conjunctiva clear, sclera non-icteric  Neck: trachea midline.  No JVD.  Pulmonary:  Good air movement, respirations not labored, no use of accessory muscles  Cardiac: RRR, no JVD Vascular:  Vessel Right Left  Radial Palpable Palpable                                   Gastrointestinal:. No masses, surgical incisions, or scars. Musculoskeletal: M/S 5/5 throughout.  Extremities without ischemic changes.  No deformity or atrophy.  Mild LE edema. Neurologic: Sensation grossly intact in extremities.  Symmetrical.  Speech is fluent. Motor exam as listed above. Psychiatric: Judgment intact, Mood & affect appropriate for pt's clinical situation. Dermatologic: No rashes or ulcers noted.  No cellulitis or open wounds.    Radiology No results found.  Labs Recent Results (from  the past 2160 hour(s))  CBG monitoring, ED     Status: Abnormal   Collection Time: 09/11/20  5:27 PM  Result Value Ref Range   Glucose-Capillary 121 (H) 70 - 99 mg/dL    Comment: Glucose reference range applies only to samples taken after fasting for at least 8 hours.  Basic metabolic panel     Status: Abnormal   Collection Time: 09/11/20  5:30 PM  Result Value Ref Range   Sodium 141 135 - 145 mmol/L   Potassium 4.0 3.5 -  5.1 mmol/L   Chloride 108 98 - 111 mmol/L   CO2 23 22 - 32 mmol/L   Glucose, Bld 126 (H) 70 - 99 mg/dL    Comment: Glucose reference range applies only to samples taken after fasting for at least 8 hours.   BUN 25 (H) 8 - 23 mg/dL   Creatinine, Ser 1.32 (H) 0.61 - 1.24 mg/dL   Calcium 9.1 8.9 - 44.0 mg/dL   GFR, Estimated 38 (L) >60 mL/min    Comment: (NOTE) Calculated using the CKD-EPI Creatinine Equation (2021)    Anion gap 10 5 - 15    Comment: Performed at [facility], [address] [place], [place] [address]  CBC     Status: Abnormal   Collection Time: 09/11/20  5:30 PM  Result Value Ref Range   WBC 4.2 4.0 - 10.5 K/uL   RBC 3.98 (L) 4.22 - 5.81 MIL/uL   Hemoglobin 11.7 (L) 13.0 - 17.0 g/dL   HCT 53.6 (L) 64.4 - 03.4 %   MCV 88.4 80.0 - 100.0 fL   MCH 29.4 26.0 - 34.0 pg   MCHC 33.2 30.0 - 36.0 g/dL   RDW 74.2 59.5 - 63.8 %   Platelets 273 150 - 400 K/uL   nRBC 0.0 0.0 - 0.2 %    Comment: Performed at [facility], [address] [place] [address] [place], [place] [address]  TSH     Status: None   Collection Time: 09/11/20  5:30 PM  Result Value Ref Range   TSH 4.330 0.350 - 4.500 uIU/mL    Comment: Performed by a 3rd Generation assay with a functional sensitivity of <=0.01 uIU/mL. Performed at [facility], [address], [place], [place] [address]   Hepatic function panel     Status: None   Collection Time: 09/11/20  5:30 PM  Result Value Ref Range   Total Protein 7.3 6.5 - 8.1 g/dL   Albumin 4.0 3.5  - 5.0 g/dL   AST 18 15 - 41 U/L   ALT 10 0 - 44 U/L   Alkaline Phosphatase 88 38 - 126 U/L   Total Bilirubin 0.7 0.3 - 1.2 mg/dL   Bilirubin, Direct <8.8 0.0 - 0.2 mg/dL   Indirect Bilirubin NOT CALCULATED 0.3 - 0.9 mg/dL    Comment: Performed at [facility], [address] [place], [place] [address]  Troponin I (High Sensitivity)     Status: None   Collection Time: 09/11/20  5:30 PM  Result Value Ref Range   Troponin I (High Sensitivity) 4 <18 ng/L    Comment: (NOTE) Elevated high sensitivity troponin I (hsTnI) values and significant  changes across serial measurements may suggest ACS but many other  chronic and acute conditions are known to elevate hsTnI results.  Refer to the Links section for chest pain algorithms and additional  guidance. Performed at [facility], [address] [place], [place] [address]     Assessment/Plan:  Mesenteric ischemia [facility]) Previously treated with mesenteric intervention with significant improvement in his abdominal pain and now some weight gain.  This was checked earlier this year and was stable.  We will continue to follow this.  Essential hypertension blood pressure control important in reducing the progression of atherosclerotic disease. On appropriate oral medications.   Occlusion and stenosis of bilateral carotid arteries The patient has undergone a carotid duplex which I have reviewed.  This demonstrates a likely occlusion of the right carotid artery  although trickle flow through the right carotid system may be present and could not be definitively discerned on the ultrasound.  There is calcific plaque in the left carotid artery with the velocities would fall in the less than 50% range by duplex criteria. Given this finding, I would recommend a catheter-based angiogram for further evaluation of his carotid disease.  If anatomy is suitable for carotid artery stenting at the time of angiography, this  could be considered.  Due to the dense calcific nature described on the ultrasound I suspect this is less likely.  I would also plan to evaluate his left carotid circulation as there could be more significant disease there that was not seen well on ultrasound.  The patient and his family voiced their understanding.  Risks and benefits of this procedure were discussed and they are agreeable to proceed.  Mixed hyperlipidemia lipid control important in reducing the progression of atherosclerotic disease. Continue statin therapy       [name] 10/14/2020, 11:08 AM   This note was created with Dragon medical transcription system.  Any errors from dictation are unintentional.

## 2020-10-14 NOTE — Assessment & Plan Note (Signed)
lipid control important in reducing the progression of atherosclerotic disease. Continue statin therapy  

## 2020-10-14 NOTE — Assessment & Plan Note (Signed)
Previously treated with mesenteric intervention with significant improvement in his abdominal pain and now some weight gain.  This was checked earlier this year and was stable.  We will continue to follow this.

## 2020-10-20 ENCOUNTER — Other Ambulatory Visit: Payer: Self-pay

## 2020-10-20 ENCOUNTER — Other Ambulatory Visit (INDEPENDENT_AMBULATORY_CARE_PROVIDER_SITE_OTHER): Payer: Self-pay | Admitting: Nurse Practitioner

## 2020-10-20 ENCOUNTER — Encounter
Admission: RE | Disposition: A | Discharge status: Home / Self Care | Payer: Self-pay | Source: Home / Self Care | Attending: Vascular Surgery

## 2020-10-20 ENCOUNTER — Ambulatory Visit
Admission: RE | Admit: 2020-10-20 | Discharge: 2020-10-20 | Disposition: A | Discharge status: Home / Self Care | Payer: Medicare PPO | Attending: Vascular Surgery | Admitting: Vascular Surgery

## 2020-10-20 ENCOUNTER — Encounter: Payer: Self-pay | Admitting: Vascular Surgery

## 2020-10-20 DIAGNOSIS — Z7982 Long term (current) use of aspirin: Secondary | ICD-10-CM | POA: Insufficient documentation

## 2020-10-20 DIAGNOSIS — I6523 Occlusion and stenosis of bilateral carotid arteries: Secondary | ICD-10-CM | POA: Diagnosis not present

## 2020-10-20 DIAGNOSIS — Z79890 Hormone replacement therapy (postmenopausal): Secondary | ICD-10-CM | POA: Diagnosis not present

## 2020-10-20 DIAGNOSIS — Z7902 Long term (current) use of antithrombotics/antiplatelets: Secondary | ICD-10-CM | POA: Diagnosis not present

## 2020-10-20 DIAGNOSIS — I10 Essential (primary) hypertension: Secondary | ICD-10-CM | POA: Diagnosis not present

## 2020-10-20 DIAGNOSIS — Z79899 Other long term (current) drug therapy: Secondary | ICD-10-CM | POA: Diagnosis not present

## 2020-10-20 DIAGNOSIS — K551 Chronic vascular disorders of intestine: Secondary | ICD-10-CM | POA: Diagnosis not present

## 2020-10-20 DIAGNOSIS — I6521 Occlusion and stenosis of right carotid artery: Secondary | ICD-10-CM

## 2020-10-20 HISTORY — PX: CAROTID ANGIOGRAPHY: CATH118230

## 2020-10-20 LAB — CREATININE, SERUM
Creatinine, Ser: 2.02 mg/dL — ABNORMAL HIGH (ref 0.61–1.24)
GFR, Estimated: 32 mL/min — ABNORMAL LOW (ref >60)

## 2020-10-20 LAB — BUN: BUN: 30 mg/dL — ABNORMAL HIGH (ref 8–23)

## 2020-10-20 SURGERY — CAROTID ANGIOGRAPHY
Anesthesia: Moderate Sedation | Laterality: Right

## 2020-10-20 MED ORDER — MIDAZOLAM HCL 5 MG/5ML IJ SOLN
INTRAMUSCULAR | Status: AC
  Filled 2020-10-20: qty 5

## 2020-10-20 MED ORDER — DIPHENHYDRAMINE HCL 50 MG/ML IJ SOLN: 50.0000 mg | Freq: Once | INTRAMUSCULAR | Status: DC | PRN

## 2020-10-20 MED ORDER — ATROPINE SULFATE 1 MG/10ML IJ SOSY
PREFILLED_SYRINGE | INTRAMUSCULAR | Status: AC
  Filled 2020-10-20: qty 20

## 2020-10-20 MED ORDER — MIDAZOLAM HCL 2 MG/ML PO SYRP: 8.0000 mg | ORAL_SOLUTION | Freq: Once | ORAL | Status: DC | PRN

## 2020-10-20 MED ORDER — FENTANYL CITRATE (PF) 100 MCG/2ML IJ SOLN
INTRAMUSCULAR | Status: DC | PRN
  Administered 2020-10-20: 25 ug via INTRAVENOUS

## 2020-10-20 MED ORDER — IODIXANOL 320 MG/ML IV SOLN
INTRAVENOUS | Status: DC | PRN
  Administered 2020-10-20: 40 mL

## 2020-10-20 MED ORDER — SODIUM CHLORIDE 0.9 % IV SOLN: INTRAVENOUS | Status: DC

## 2020-10-20 MED ORDER — CEFAZOLIN SODIUM-DEXTROSE 2-4 GM/100ML-% IV SOLN
INTRAVENOUS | Status: AC
  Administered 2020-10-20: 2 g via INTRAVENOUS
  Filled 2020-10-20: qty 100

## 2020-10-20 MED ORDER — HYDROMORPHONE HCL 1 MG/ML IJ SOLN: 1.0000 mg | Freq: Once | INTRAMUSCULAR | Status: DC | PRN

## 2020-10-20 MED ORDER — MIDAZOLAM HCL 2 MG/2ML IJ SOLN
INTRAMUSCULAR | Status: DC | PRN
Start: 2020-10-20 — End: 2020-10-20
  Administered 2020-10-20: 1 mg via INTRAVENOUS

## 2020-10-20 MED ORDER — DOPAMINE-DEXTROSE 3.2-5 MG/ML-% IV SOLN
INTRAVENOUS | Status: AC
  Filled 2020-10-20: qty 250

## 2020-10-20 MED ORDER — FAMOTIDINE 20 MG PO TABS: 40.0000 mg | ORAL_TABLET | Freq: Once | ORAL | Status: DC | PRN

## 2020-10-20 MED ORDER — METHYLPREDNISOLONE SODIUM SUCC 125 MG IJ SOLR: 125.0000 mg | Freq: Once | INTRAMUSCULAR | Status: DC | PRN

## 2020-10-20 MED ORDER — HEPARIN SODIUM (PORCINE) 1000 UNIT/ML IJ SOLN
INTRAMUSCULAR | Status: AC
  Filled 2020-10-20: qty 1

## 2020-10-20 MED ORDER — ONDANSETRON HCL 4 MG/2ML IJ SOLN: 4.0000 mg | Freq: Four times a day (QID) | INTRAMUSCULAR | Status: DC | PRN

## 2020-10-20 MED ORDER — FENTANYL CITRATE (PF) 100 MCG/2ML IJ SOLN
INTRAMUSCULAR | Status: AC
  Filled 2020-10-20: qty 2

## 2020-10-20 MED ORDER — CEFAZOLIN SODIUM-DEXTROSE 2-4 GM/100ML-% IV SOLN: 2.0000 g | Freq: Once | INTRAVENOUS | Status: AC

## 2020-10-20 MED ORDER — PHENYLEPHRINE HCL (PRESSORS) 10 MG/ML IV SOLN
INTRAVENOUS | Status: AC
  Filled 2020-10-20: qty 1

## 2020-10-20 MED ORDER — HEPARIN SODIUM (PORCINE) 1000 UNIT/ML IJ SOLN
INTRAMUSCULAR | Status: DC | PRN
  Administered 2020-10-20: 3000 [IU] via INTRAVENOUS

## 2020-10-20 SURGICAL SUPPLY — 14 items
CATH ANGIO 5F PIGTAIL 100CM (CATHETERS) ×2 IMPLANT
CATH BEACON 5 .035 100 H1 TIP (CATHETERS) ×2 IMPLANT
CATH BEACON 5 .035 100 JB1 TIP (CATHETERS) ×2 IMPLANT
COVER DRAPE FLUORO 36X44 (DRAPES) ×2 IMPLANT
COVER PROBE U/S 5X48 (MISCELLANEOUS) ×2 IMPLANT
DEVICE STARCLOSE SE CLOSURE (Vascular Products) ×2 IMPLANT
DEVICE TORQUE (MISCELLANEOUS) ×2 IMPLANT
GLIDEWIRE ANGLED SS 035X260CM (WIRE) ×2 IMPLANT
KIT CAROTID MANIFOLD (MISCELLANEOUS) ×2 IMPLANT
PACK ANGIOGRAPHY (CUSTOM PROCEDURE TRAY) ×2 IMPLANT
SHEATH BRITE TIP 5FRX11 (SHEATH) ×2 IMPLANT
SYR MEDRAD MARK 7 150ML (SYRINGE) ×2 IMPLANT
WIRE G VAS 035X260 STIFF (WIRE) ×2 IMPLANT
WIRE GUIDERIGHT .035X150 (WIRE) ×2 IMPLANT

## 2020-10-20 NOTE — Interval H&P Note (Signed)
History and Physical Interval Note:  10/20/2020 11:31 AM  Jeremy Merritt  has presented today for surgery, with the diagnosis of Carotid Angiography poss RT Carotid Stent Placement    RT Carotid Artery Stenosis   ABBOTT Rep   cc: Loni Muse.  The various methods of treatment have been discussed with the patient and family. After consideration of risks, benefits and other options for treatment, the patient has consented to  Procedure(s): CAROTID ANGIOGRAPHY (Right) as a surgical intervention.  The patient's history has been reviewed, patient examined, no change in status, stable for surgery.  I have reviewed the patient's chart and labs.  Questions were answered to the patient's satisfaction.     [name]

## 2020-10-20 NOTE — Op Note (Signed)
[facility]   OPERATIVE NOTE  DATE: 10/20/2020  PRE-OPERATIVE DIAGNOSIS: 1.  Right carotid artery stenosis with suspected occlusion 2. Chronic kidney disease precluding CT angiogram  POST-OPERATIVE DIAGNOSIS: Same as above  PROCEDURE: 1.   Ultrasound Guidance for vascular access right femoral artery 2.   Catheter placement into right common carotid artery and into left common carotid artery from right femoral approach 3.   Thoracic aortogram 4.   Cervical and cerebral bilateral carotid angiograms 5.   StarClose closure device right femoral artery  SURGEON: [name], MD  ASSISTANT(S): None  ANESTHESIA: Moderate conscious sedation  ESTIMATED BLOOD LOSS: 5 cc  FLUORO TIME: 5 minutes  CONTRAST: 40 cc  MODERATE CONSCIOUS SEDATION TIME: Approximately 20 minutes using 1 of Versed and 25 mcg of Fentanyl  FINDING(S): 1.  Total occlusion of the cervical right internal carotid artery with a large external carotid artery.  A large right vertebral artery could be seen on the initial aortogram.  The left cervical carotid artery had very calcific disease but only mild stenosis in the 20% range.  Normal intracranial flow on the left with good left-to-right cross-filling.  SPECIMEN(S):  None  INDICATIONS:   Patient is a 86 y.o.male who presents with suspected occlusion of the right internal carotid artery in the neck by duplex. The patient's renal function precluded CT angiogram. Catheter-based angiogram is performed for further evaluation. Risks and benefits are discussed and informed consent was obtained.  DESCRIPTION: After obtaining full informed written consent, the patient was brought back to the operating room and placed supine upon the vascular suite table.  After obtaining adequate anesthesia, the patient was prepped and draped in the standard fashion.  Moderate conscious sedation was administered during a face to face encounter with the patient throughout the  procedure with my supervision of the RN administering medicines and monitoring the patients vital signs and mental status throughout from the start of the procedure until the patient was taken to the recovery room. The right femoral artery was visualized with ultrasound and found to be calcific but patent. It was then accessed under direct ultrasound guidance without difficulty with a Seldinger needle. A J-wire and 5 French sheath were placed and a permanent image was recorded. The patient was given 3000 units of intravenous heparin. A pigtail catheter was placed into the ascending aorta and an LAO projection thoracic aortogram was performed. This showed a type II aortic arch without proximal stenosis.  The right vertebral artery is very large on the initial imaging can be seen well from thoracic aortogram.  I then selected a headhunter catheter and cannulated the innominate artery advancing into the mid right common carotid artery. Cervical and cerebral carotid angiography were then performed on the right side initially. The intracranial flow was found to be absent from the right carotid injection. The cervical carotid artery was totally occluded at the origin of the internal carotid artery.  The external carotid artery was large and patent. I then turned my attention back to the thoracic aorta and removed the catheter from the right side. I selectively cannulated the left common carotid artery without difficulty with a JB 1 catheter and advanced into the mid left common carotid artery. Selective imaging was then performed of the cervical and cerebral carotid artery on the left. Intracranial filling was brisk with significant left-to-right cross-filling. The cervical carotid artery was highly calcific but only minimal stenosis in the 20% range was seen. Multiple views were taken in the  cervical carotid artery bilaterally. At this point, we had imaging to plan our treatment and we elected to terminate the procedure.  The diagnostic catheter was removed. Oblique arteriogram was performed of the right femoral artery and StarClose closure device was deployed in usual fashion with excellent hemostatic result. The patient tolerated the procedure well and was taken to the recovery room in stable condition.  COMPLICATIONS: None  CONDITION: Stable   [name] 10/20/2020 12:29 PM   This note was created with Dragon Medical transcription system. Any errors in dictation are purely unintentional.

## 2020-10-22 ENCOUNTER — Other Ambulatory Visit (INDEPENDENT_AMBULATORY_CARE_PROVIDER_SITE_OTHER): Payer: Self-pay | Admitting: Nurse Practitioner

## 2021-01-15 ENCOUNTER — Other Ambulatory Visit (INDEPENDENT_AMBULATORY_CARE_PROVIDER_SITE_OTHER): Payer: Self-pay | Admitting: Vascular Surgery

## 2021-01-15 DIAGNOSIS — I6523 Occlusion and stenosis of bilateral carotid arteries: Secondary | ICD-10-CM

## 2021-01-16 ENCOUNTER — Other Ambulatory Visit: Payer: Self-pay

## 2021-01-16 ENCOUNTER — Ambulatory Visit (INDEPENDENT_AMBULATORY_CARE_PROVIDER_SITE_OTHER): Payer: Medicare PPO | Admitting: Vascular Surgery

## 2021-01-16 ENCOUNTER — Ambulatory Visit (INDEPENDENT_AMBULATORY_CARE_PROVIDER_SITE_OTHER): Payer: Medicare PPO

## 2021-01-16 ENCOUNTER — Encounter (INDEPENDENT_AMBULATORY_CARE_PROVIDER_SITE_OTHER): Payer: Self-pay | Admitting: Vascular Surgery

## 2021-01-16 VITALS — BP 165/77 | HR 74 | Resp 15 | Wt 115.6 lb

## 2021-01-16 DIAGNOSIS — I6523 Occlusion and stenosis of bilateral carotid arteries: Secondary | ICD-10-CM | POA: Diagnosis not present

## 2021-01-16 DIAGNOSIS — I10 Essential (primary) hypertension: Secondary | ICD-10-CM | POA: Diagnosis not present

## 2021-01-16 DIAGNOSIS — K559 Vascular disorder of intestine, unspecified: Secondary | ICD-10-CM | POA: Diagnosis not present

## 2021-01-16 NOTE — Progress Notes (Signed)
MRN : [id number]  Jeremy Merritt is a 86 y.o. ([birth year]) male who presents with chief complaint of  Chief Complaint  Patient presents with   Follow-up    [facility] 3 month post carotid  .  History of Present Illness: Patient returns today in follow up of his carotid disease.  He is doing well without any neurologic symptoms or problems since his last visit.  He is also followed for mesenteric disease and is doing much better status post mesenteric artery revascularization last year. Carotid duplex today reveals a known right carotid artery occlusion and stable 1 to 39% left ICA stenosis.  Current Outpatient Medications  Medication Sig Dispense Refill   aspirin EC 81 MG tablet Take 81 mg by mouth daily.     Blood Pressure Monitor DEVI Use as directed. 1 each 0   Cholecalciferol (VITAMIN D3) 50 MCG (2000 UT) TABS Take 2,000 Units by mouth daily. 90 tablet 3   clopidogrel (PLAVIX) 75 MG tablet Take 1 tablet (75 mg total) by mouth daily. 90 tablet 1   lisinopril-hydrochlorothiazide (ZESTORETIC) 20-12.5 MG tablet Take 1 tablet by mouth daily. 90 tablet 1   omeprazole (PRILOSEC) 40 MG capsule Take 1 capsule (40 mg total) by mouth 2 (two) times daily. 60 capsule 2   Vitamin D, Ergocalciferol, 50 MCG (2000 UT) CAPS 1 capsule     levothyroxine (SYNTHROID) 100 MCG tablet Take 1 tablet (100 mcg total) by mouth daily before breakfast. 90 tablet 1   simvastatin (ZOCOR) 20 MG tablet Take 1 tablet (20 mg total) by mouth at bedtime. 90 tablet 1   No current facility-administered medications for this visit.    Past Medical History:  Diagnosis Date   Allergy    Bilateral lower extremity edema 05/04/2019   Hyperlipidemia    Hypertension    Thyroid disease     Past Surgical History:  Procedure Laterality Date   CAROTID ANGIOGRAPHY Right 10/20/2020   Procedure: CAROTID ANGIOGRAPHY;  Surgeon: Annice Needy, MD;  Location: [facility] INVASIVE CV LAB;  Service: Cardiovascular;  Laterality: Right;    COLONOSCOPY WITH PROPOFOL N/A 04/28/2019   Procedure: COLONOSCOPY WITH PROPOFOL;  Surgeon: Toney Reil, MD;  Location: [facility] ENDOSCOPY;  Service: Gastroenterology;  Laterality: N/A;   ESOPHAGOGASTRODUODENOSCOPY (EGD) WITH PROPOFOL N/A 04/28/2019   Procedure: ESOPHAGOGASTRODUODENOSCOPY (EGD) WITH PROPOFOL;  Surgeon: Toney Reil, MD;  Location: [facility] ENDOSCOPY;  Service: Gastroenterology;  Laterality: N/A;   INGUINAL HERNIA REPAIR     VISCERAL ANGIOGRAPHY N/A 04/18/2019   Procedure: VISCERAL ANGIOGRAPHY;  Surgeon: Annice Needy, MD;  Location: [facility] INVASIVE CV LAB;  Service: Cardiovascular;  Laterality: N/A;     Social History   Tobacco Use   Smoking status: Never   Smokeless tobacco: Never  Vaping Use   Vaping Use: Never used  Substance Use Topics   Alcohol use: No   Drug use: No      Family History  Problem Relation Age of Onset   Other Mother        died of old age   Other Father        Black lung   Thyroid cancer Sister    Kidney failure Brother        on dialysis   Colon cancer Neg Hx    Liver cancer Neg Hx    Stomach cancer Neg Hx      No Known Allergies  REVIEW OF SYSTEMS (Negative unless checked)   Constitutional: [x] Weight loss  [] Fever  []   Chills Cardiac: [] Chest pain   [] Chest pressure   [x] Palpitations   [] Shortness of breath when laying flat   [] Shortness of breath at rest   [x] Shortness of breath with exertion. Vascular:  [] Pain in legs with walking   [] Pain in legs at rest   [] Pain in legs when laying flat   [] Claudication   [] Pain in feet when walking  [] Pain in feet at rest  [] Pain in feet when laying flat   [] History of DVT   [] Phlebitis   [] Swelling in legs   [] Varicose veins   [] Non-healing ulcers Pulmonary:   [] Uses home oxygen   [] Productive cough   [] Hemoptysis   [] Wheeze  [] COPD   [] Asthma Neurologic:  [x] Dizziness  [] Blackouts   [] Seizures   [] History of stroke   [] History of TIA  [] Aphasia   [] Temporary blindness   [] Dysphagia    [] Weakness or numbness in arms   [] Weakness or numbness in legs Musculoskeletal:  [x] Arthritis   [] Joint swelling   [x] Joint pain   [] Low back pain Hematologic:  [] Easy bruising  [] Easy bleeding   [] Hypercoagulable state   [] Anemic  [] Hepatitis Gastrointestinal:  [] Blood in stool   [] Vomiting blood  [] Gastroesophageal reflux/heartburn   [] Abdominal pain Genitourinary:  [] Chronic kidney disease   [] Difficult urination  [] Frequent urination  [] Burning with urination   [] Hematuria Skin:  [] Rashes   [] Ulcers   [] Wounds Psychological:  [] History of anxiety   []  History of major depression.  Physical Examination  BP (!) 165/77 (BP Location: Right Arm)   Pulse 74   Resp 15   Wt 115 lb 9.6 oz (52.4 kg)   BMI 18.11 kg/m  Gen: thin, elderly, NAD Head: [place]/AT, No temporalis wasting. Ear/Nose/Throat: Hearing grossly intact, nares w/o erythema or drainage Eyes: Conjunctiva clear. Sclera non-icteric Neck: Supple.  Trachea midline. No bruit Pulmonary:  Good air movement, no use of accessory muscles.  Cardiac: RRR, no JVD Vascular:  Vessel Right Left  Radial Palpable Palpable                                   Gastrointestinal: soft, non-tender/non-distended. No guarding/reflex.  Musculoskeletal: M/S 5/5 throughout.  No deformity or atrophy. No edema. Neurologic: Sensation grossly intact in extremities.  Symmetrical.  Speech is fluent.  Psychiatric: Judgment intact, Mood & affect appropriate for pt's clinical situation. Dermatologic: No rashes or ulcers noted.  No cellulitis or open wounds.      Labs Recent Results (from the past 2160 hour(s))  BUN     Status: Abnormal   Collection Time: 10/20/20 11:11 AM  Result Value Ref Range   BUN 30 (H) 8 - 23 mg/dL    Comment: Performed at [facility], [address] [place],   Creatinine, serum     Status: Abnormal   Collection Time: 10/20/20 11:11 AM  Result Value Ref Range   Creatinine, Ser 2.02 (H) 0.61 -  1.24 mg/dL   GFR, Estimated 32 (L) >60 mL/min    Comment: (NOTE) Calculated using the CKD-EPI Creatinine Equation (2021) Performed at [facility], [address] [place],      Radiology No results found.  Assessment/Plan  Mesenteric ischemia (HCC) Doing far better after intervention.  To be checked later this year.  Essential hypertension blood pressure control important in reducing the progression of atherosclerotic disease. On appropriate oral medications.   Occlusion and stenosis of bilateral carotid arteries  Carotid duplex today reveals a known right carotid artery occlusion and stable 1 to 39% left ICA stenosis.  This is stable from his angiogram earlier this year.  We can now follow this annually.  Continue current medical regimen.    [name], MD  01/16/2021 11:12 AM    This note was created with Dragon medical transcription system.  Any errors from dictation are purely unintentional

## 2021-01-16 NOTE — Assessment & Plan Note (Signed)
blood pressure control important in reducing the progression of atherosclerotic disease. On appropriate oral medications.  

## 2021-01-16 NOTE — Assessment & Plan Note (Signed)
Carotid duplex today reveals a known right carotid artery occlusion and stable 1 to 39% left ICA stenosis.  This is stable from his angiogram earlier this year.  We can now follow this annually.  Continue current medical regimen.

## 2021-01-16 NOTE — Assessment & Plan Note (Signed)
Doing far better after intervention.  To be checked later this year.

## 2021-02-03 ENCOUNTER — Encounter (INDEPENDENT_AMBULATORY_CARE_PROVIDER_SITE_OTHER): Payer: Medicare PPO

## 2021-02-03 ENCOUNTER — Ambulatory Visit (INDEPENDENT_AMBULATORY_CARE_PROVIDER_SITE_OTHER): Payer: Medicare PPO | Admitting: Vascular Surgery

## 2021-03-31 DIAGNOSIS — H524 Presbyopia: Secondary | ICD-10-CM | POA: Diagnosis not present

## 2021-03-31 DIAGNOSIS — H52201 Unspecified astigmatism, right eye: Secondary | ICD-10-CM | POA: Diagnosis not present

## 2021-03-31 DIAGNOSIS — H2513 Age-related nuclear cataract, bilateral: Secondary | ICD-10-CM | POA: Diagnosis not present

## 2021-03-31 DIAGNOSIS — H5203 Hypermetropia, bilateral: Secondary | ICD-10-CM | POA: Diagnosis not present

## 2021-06-04 IMAGING — DX DG CHEST 1V PORT
1 series · 1 of 1 positions shown · non-contrast
Comparison: October 20, 2018

CLINICAL DATA: Weakness for several days.

EXAM:
PORTABLE CHEST 1 VIEW

[chest ap]
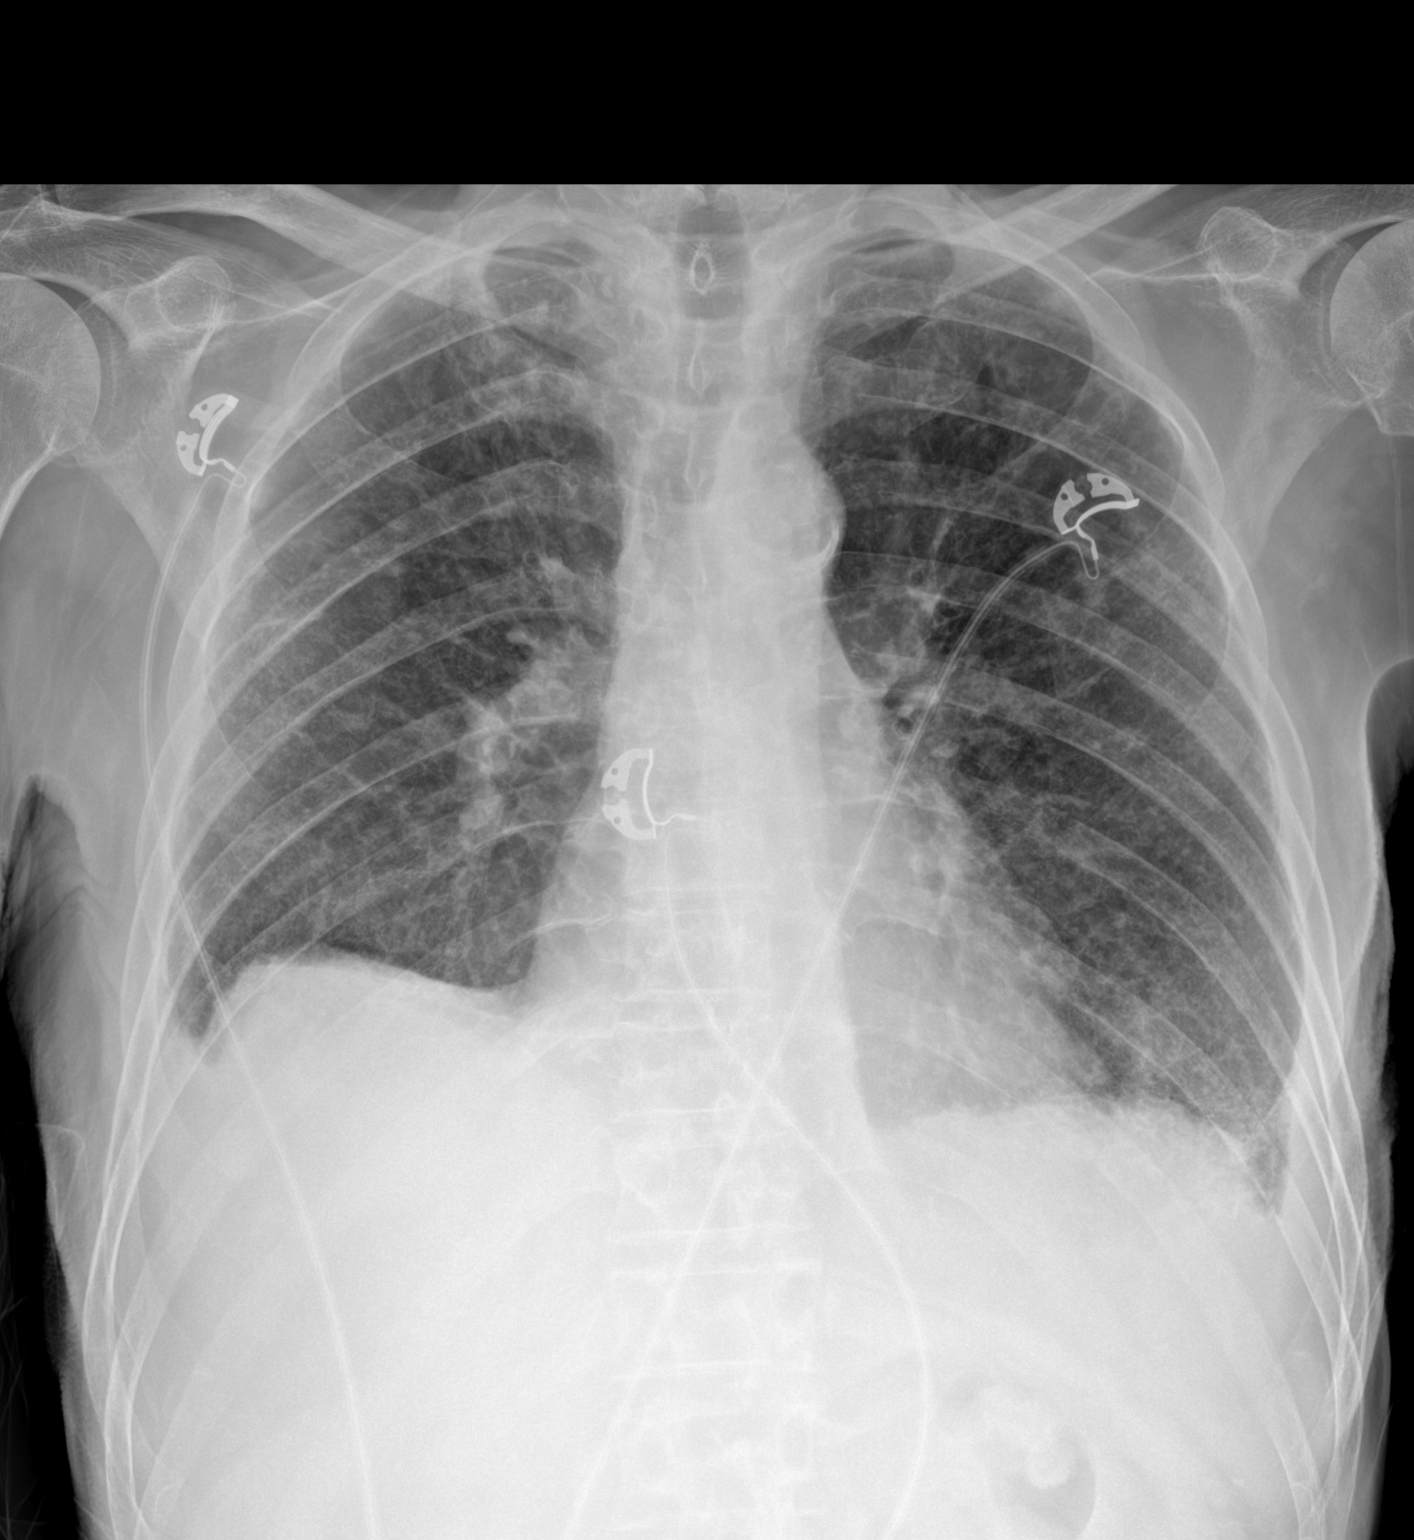

[1 of 1 positions shown; findings below may reference images not displayed]

FINDINGS: Cardiomediastinal silhouette is normal. Mediastinal contours appear
intact. Calcific atherosclerotic disease of the aorta.

Small bilateral pleural effusions. Nodularity noted in bilateral
upper lobes, better evaluated by chest CT dated March 16, 2019.

Osseous structures are without acute abnormality. Soft tissues are
grossly normal.
IMPRESSION: 1. Small bilateral pleural effusions.
2. Nodularity noted in bilateral upper lobes, not significantly
changed by the appearance on chest CT dated March 16, 2019,
accounting for differences in technique. Per the recommendation of
the CT report, evaluation with follow-up chest CT is recommended.

## 2021-06-25 ENCOUNTER — Telehealth (INDEPENDENT_AMBULATORY_CARE_PROVIDER_SITE_OTHER): Payer: Self-pay

## 2021-06-25 NOTE — Telephone Encounter (Signed)
I spoke to Dr. Gilda Crease who made me aware that the issue we addressed here in our office for the patient usually doesn't have diarrhea as a symptom due to the pt not being able to intake a lot of food because of pain, the provider said that it is likely a GI issue and that the pt should see his PCP I called the pt and made the pt as well as his daughter aware.

## 2021-06-25 NOTE — Telephone Encounter (Signed)
Patient called in wanting to be seen sooner stating that his symptoms are returning and he thinks its best he comes in. Symptoms are no control of BM and having the urge to go to the bathroom a lot    Please call and advise

## 2021-07-18 IMAGING — DX DG CHEST 1V PORT
1 series · 1 of 1 positions shown · non-contrast
Comparison: 10/14/2019, 03/16/2019

CLINICAL DATA: Shortness of breath

EXAM:
PORTABLE CHEST 1 VIEW

[chest ap]
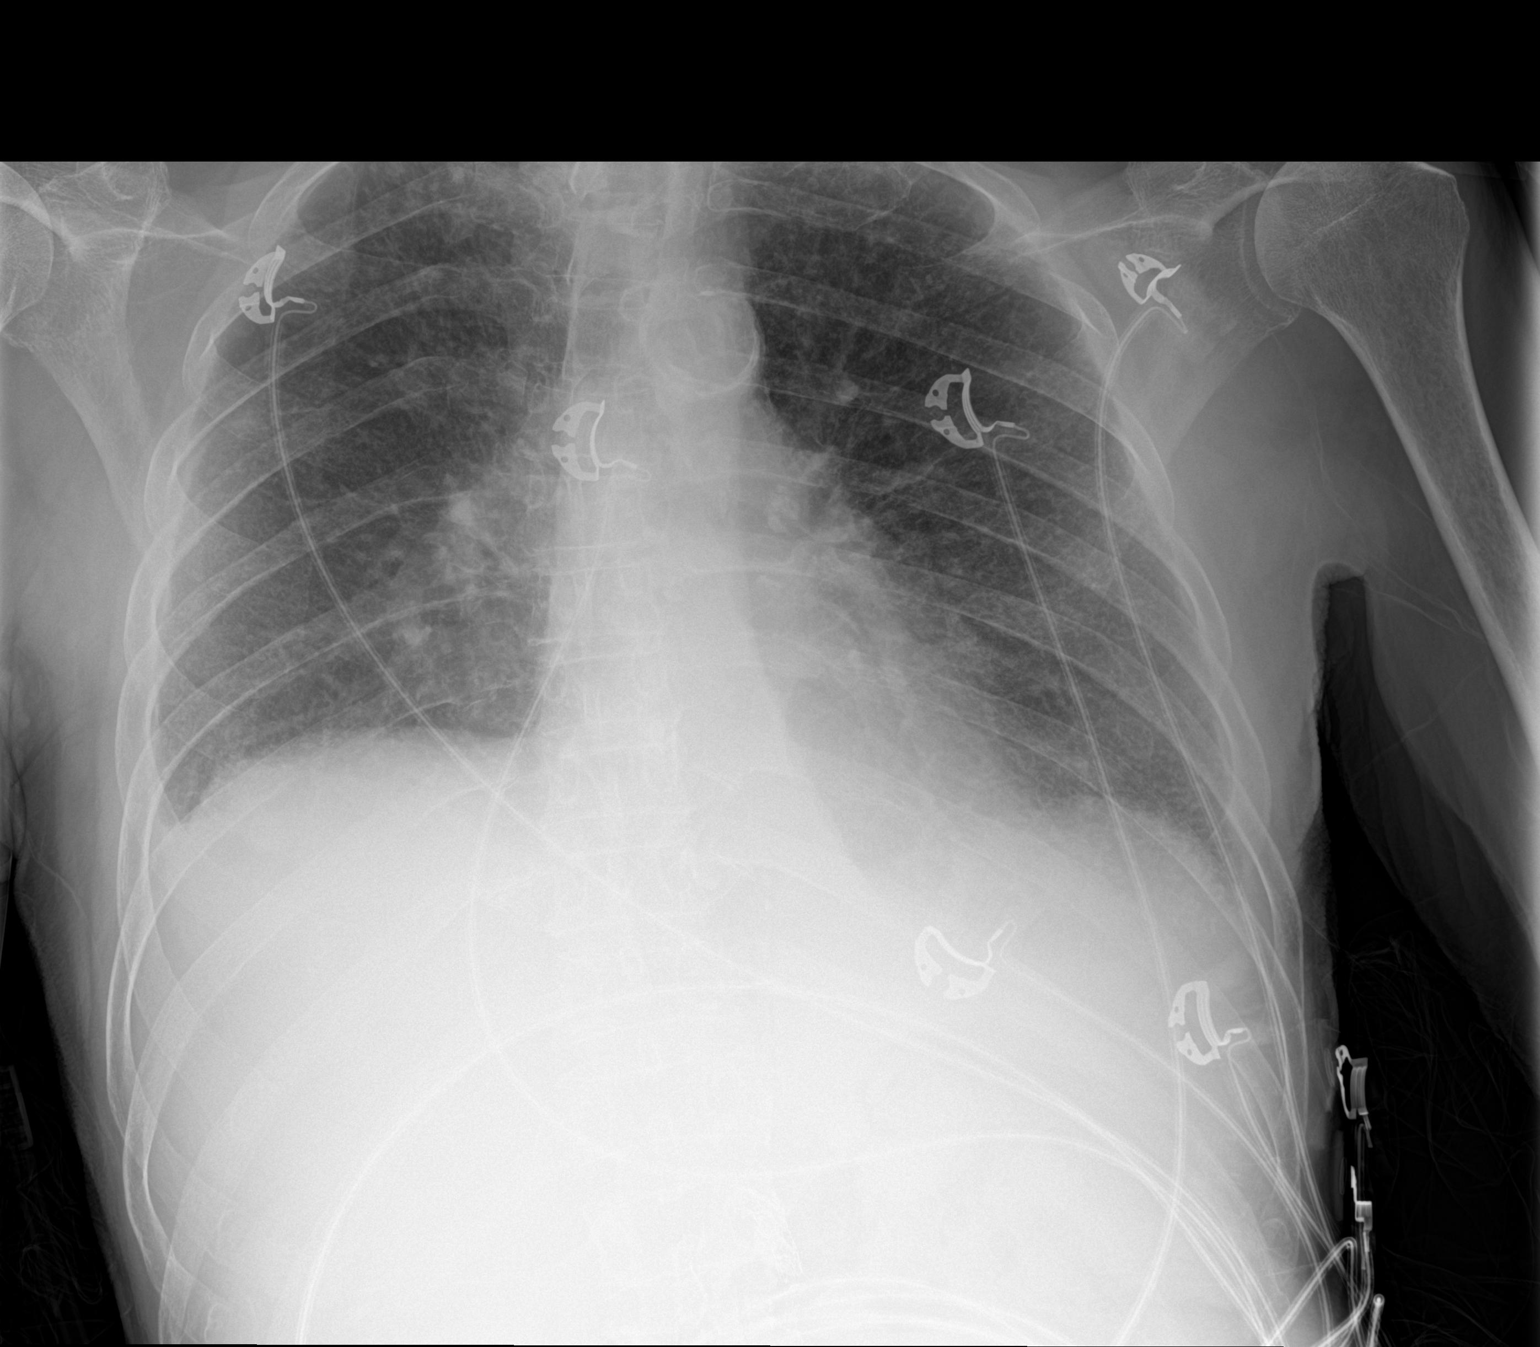

[1 of 1 positions shown; findings below may reference images not displayed]

FINDINGS: Stable cardiomediastinal contours. Atherosclerotic calcification of
the aortic knob. Small bilateral pleural effusions. Prominent
bilateral interstitial markings. Similar appearance of scattered
nodularity throughout the lungs, most evident at the right lung
apex, as seen on previous CT 03/16/2019. No pneumothorax.
IMPRESSION: 1. Mild diffuse interstitial prominence with small bilateral pleural
effusions. Query mild edema.
2. Similar appearance of scattered nodularity throughout the lungs,
most evident at the right lung apex.

## 2021-09-04 ENCOUNTER — Emergency Department
Admission: EM | Admit: 2021-09-04 | Discharge: 2021-09-04 | Disposition: A | Discharge status: Home / Self Care | Payer: No Typology Code available for payment source | Attending: Emergency Medicine | Admitting: Emergency Medicine

## 2021-09-04 ENCOUNTER — Other Ambulatory Visit: Payer: Self-pay

## 2021-09-04 ENCOUNTER — Emergency Department: Payer: No Typology Code available for payment source

## 2021-09-04 DIAGNOSIS — I10 Essential (primary) hypertension: Secondary | ICD-10-CM | POA: Insufficient documentation

## 2021-09-04 DIAGNOSIS — G9389 Other specified disorders of brain: Secondary | ICD-10-CM | POA: Diagnosis not present

## 2021-09-04 DIAGNOSIS — W19XXXA Unspecified fall, initial encounter: Secondary | ICD-10-CM | POA: Insufficient documentation

## 2021-09-04 DIAGNOSIS — R000 Tachycardia, unspecified: Secondary | ICD-10-CM | POA: Diagnosis not present

## 2021-09-04 DIAGNOSIS — S22000A Wedge compression fracture of unspecified thoracic vertebra, initial encounter for closed fracture: Secondary | ICD-10-CM

## 2021-09-04 DIAGNOSIS — S22089A Unspecified fracture of T11-T12 vertebra, initial encounter for closed fracture: Secondary | ICD-10-CM | POA: Diagnosis not present

## 2021-09-04 DIAGNOSIS — R079 Chest pain, unspecified: Secondary | ICD-10-CM | POA: Diagnosis not present

## 2021-09-04 DIAGNOSIS — S0990XA Unspecified injury of head, initial encounter: Secondary | ICD-10-CM | POA: Insufficient documentation

## 2021-09-04 DIAGNOSIS — S299XXA Unspecified injury of thorax, initial encounter: Secondary | ICD-10-CM | POA: Diagnosis present

## 2021-09-04 DIAGNOSIS — R1032 Left lower quadrant pain: Secondary | ICD-10-CM | POA: Diagnosis not present

## 2021-09-04 LAB — CBC
HCT: 40.7 % (ref 39.0–52.0)
Hemoglobin: 13.5 g/dL (ref 13.0–17.0)
MCH: 27.2 pg (ref 26.0–34.0)
MCHC: 33.2 g/dL (ref 30.0–36.0)
MCV: 82.1 fL (ref 80.0–100.0)
Platelets: 211 10*3/uL (ref 150–400)
RBC: 4.96 MIL/uL (ref 4.22–5.81)
RDW: 15.2 % (ref 11.5–15.5)
WBC: 6 10*3/uL (ref 4.0–10.5)
nRBC: 0 % (ref 0.0–0.2)

## 2021-09-04 LAB — COMPREHENSIVE METABOLIC PANEL
ALT: 12 U/L (ref 0–44)
AST: 19 U/L (ref 15–41)
Albumin: 4 g/dL (ref 3.5–5.0)
Alkaline Phosphatase: 112 U/L (ref 38–126)
Anion gap: 10 (ref 5–15)
BUN: 15 mg/dL (ref 8–23)
CO2: 23 mmol/L (ref 22–32)
Calcium: 8.7 mg/dL — ABNORMAL LOW (ref 8.9–10.3)
Chloride: 106 mmol/L (ref 98–111)
Creatinine, Ser: 1.13 mg/dL (ref 0.61–1.24)
GFR, Estimated: >60 mL/min (ref >60)
Glucose, Bld: 113 mg/dL — ABNORMAL HIGH (ref 70–99)
Potassium: 3.8 mmol/L (ref 3.5–5.1)
Sodium: 139 mmol/L (ref 135–145)
Total Bilirubin: 1 mg/dL (ref 0.3–1.2)
Total Protein: 7 g/dL (ref 6.5–8.1)

## 2021-09-04 MED ORDER — TRAMADOL HCL 50 MG PO TABS: 50.0000 mg | ORAL_TABLET | Freq: Four times a day (QID) | ORAL | 0 refills | Status: DC | PRN

## 2021-09-04 NOTE — Discharge Instructions (Addendum)
As we discussed please take Tylenol as needed for discomfort, for severe pain you may use your prescribed tramadol as written.  Do not drink alcohol or drive while taking this medication.  Please call the number provided to arrange a follow-up appointment with neurosurgery.  Return to the emergency department for any worsening pain, any weakness or numbness of either leg. ?

## 2021-09-04 NOTE — ED Notes (Signed)
Got ahold of Pam via [employer] personal phone. Pam states she is changing clothes and then will come pick pt up. Pt resting calmly on stretcher; about 100cc of urine noted in urinal.  ?

## 2021-09-04 NOTE — ED Triage Notes (Signed)
BIB [facility] from home. Pt fell yesterday and is complaining of left side and flank pain. Pt was able to walk at home.  Pt is CAO and answering questions as asked.  ?

## 2021-09-04 NOTE — ED Notes (Signed)
Pt currently using urinal.

## 2021-09-04 NOTE — ED Notes (Signed)
Called [phone] per pt request as he stated this number might get Pam. No answer. Was able to leave this nurse's name and ascom number in a voicemail.  ?

## 2021-09-04 NOTE — ED Provider Notes (Signed)
? ?[facility] ?Provider Note ? ? ? Event Date/Time  ? First MD Initiated Contact with Patient 09/04/21 [phone]   ?  (approximate) ? ?History  ? ?Chief Complaint: Fall ? ?HPI ? ?Jeremy Merritt is a 87 y.o. male medical history of hypertension, hyperlipidemia presents to the emergency department for left back/posterior chest pain after a fall.  According to the patient yesterday he was using his leg to squish the garbage into the garbage can, states his leg got tangled up in the trash can and he fell over along with the trash can.  Patient states he fell onto his left side.  Denies hitting his head.  Denies LOC.  Patient has been ambulatory without issue today but states pain in his left posterior chest when he moves ever since the fall.  Denies any shortness of breath. ? ? ?Physical Exam  ? ?Triage Vital Signs: ?ED Triage Vitals  ?Enc Vitals Group  ?   BP 09/04/21 0939 (!) 163/75  ?   Pulse Rate 09/04/21 0939 (!) 110  ?   Resp 09/04/21 0939 16  ?   Temp 09/04/21 0939 98.5 ?F (36.9 ?C)  ?   Temp Source 09/04/21 0939 Oral  ?   SpO2 09/04/21 0939 95 %  ?   Weight 09/04/21 0941 125 lb (56.7 kg)  ?   Height 09/04/21 0941 5\' 7"  (1.702 m)  ?   Head Circumference --   ?   Peak Flow --   ?   Pain Score 09/04/21 0940 5  ?   Pain Loc --   ?   Pain Edu? --   ?   Excl. in [place]? --   ? ? ?Most recent vital signs: ?Vitals:  ? 09/04/21 0939  ?BP: (!) 163/75  ?Pulse: (!) 110  ?Resp: 16  ?Temp: 98.5 ?F (36.9 ?C)  ?SpO2: 95%  ? ? ?General: Awake, no distress.  ?CV:  Good peripheral perfusion.  Regular rate and rhythm  ?Resp:  Normal effort.  Equal breath sounds bilaterally.  Mild left lower lateral posterior chest wall tenderness to palpation. ?Abd:  No distention.  Soft, nontender.  No rebound or guarding. ? ? ?ED Results / Procedures / Treatments  ? ?EKG ? ?EKG viewed and interpreted by myself shows what appears to be sinus tachycardia at 115 bpm with a narrow QRS, normal axis, normal intervals, no concerning  ST changes. ? ?RADIOLOGY ? ?I personally reviewed the CT images.  Patient does appear to have a right-sided pleural effusion. ?Radiology is read the CT scan as acute to subacute moderate T12 compression fracture.  Moderate right pleural effusion. 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 ? ?MEDICATIONS ORDERED IN ED: ?Medications - No data to display ? ? ?IMPRESSION / MDM / ASSESSMENT AND PLAN / ED COURSE  ?I reviewed the triage vital signs and the nursing notes. ? ?Patient presents to the emergency department after a fall yesterday onto his left side.  Patient has tenderness to palpation in the left posterior lateral lower chest wall.  No obvious bruising/ecchymosis.  No shortness of breath with clear lung sounds bilaterally.  Patient is mildly tachycardic.  We will check labs we obtain CT imaging of the chest to rule out rib fracture and to evaluate lungs to make sure there is no pneumothorax.  Given the fall even though patient denies head injury given his age and possibility of shear forces we will obtain CT imaging of the head  as a precaution.  We will continue to closely monitor while awaiting results.  Patient agreeable to plan of care. ? ?Patient's work-up is resulted showing a likely acute T12 compression fracture which is very likely the cause of the patient's pain/discomfort that is worse with movement.  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  Normal CBC.  Normal chemistry.   ? ?I spoke to Dr. Tamala Julian of neurosurgery.  He will follow-up the patient in the clinic.  He did recommend that the patient could optionally stay for a TLSO brace although not completely necessary.  I spoke to the patient regarding this he states he would rather just follow-up in the clinic as he is ready to go home.  We will discharge with tramadol to be used in addition to Tylenol if needed and neurosurgery follow-up.  Patient agreeable to plan of care. ? ? ?FINAL CLINICAL IMPRESSION(S)  / ED DIAGNOSES  ? ?Fall ?Chest wall pain ?T12 compression fracture ? ?Rx / DC Orders  ? ?Tramadol ?Neurosurgery follow-up ? ?Note:  This document was prepared using Dragon voice recognition software and may include unintentional dictation errors. ?  ?Harvest Dark, MD ?09/04/21 1100 ? ?

## 2021-09-04 NOTE — ED Notes (Signed)
Called [phone] per demographics in case pt's daughter had his phone as well; person who answered stated she was not Pam and doesn't know a "Pam Grindall".  ?

## 2021-09-04 NOTE — ED Notes (Signed)
Called Jola Babinski (Daughter) at [phone] per demographics in chart. No answer x2 and voicemail full.  ?

## 2021-10-20 DIAGNOSIS — D485 Neoplasm of uncertain behavior of skin: Secondary | ICD-10-CM | POA: Diagnosis not present

## 2021-10-20 DIAGNOSIS — L570 Actinic keratosis: Secondary | ICD-10-CM | POA: Diagnosis not present

## 2021-11-27 DIAGNOSIS — L570 Actinic keratosis: Secondary | ICD-10-CM | POA: Diagnosis not present

## 2022-01-08 ENCOUNTER — Ambulatory Visit: Payer: Self-pay

## 2022-01-08 NOTE — Patient Outreach (Signed)
  Care Coordination   01/08/2022 Name: Jeremy Merritt MRN: [id number] DOB: Feb 16, 1935   Care Coordination Outreach Attempts:  An unsuccessful telephone outreach was attempted today to offer the patient information about available care coordination services as a benefit of their health plan.   Follow Up Plan:  Additional outreach attempts will be made to offer the patient care coordination information and services.   Encounter Outcome:  No Answer  Care Coordination Interventions Activated:  No   Care Coordination Interventions:  No, not indicated    [facility] Care Management [phone]

## 2022-01-15 ENCOUNTER — Ambulatory Visit (INDEPENDENT_AMBULATORY_CARE_PROVIDER_SITE_OTHER): Payer: Medicare PPO | Admitting: Vascular Surgery

## 2022-03-05 ENCOUNTER — Ambulatory Visit (INDEPENDENT_AMBULATORY_CARE_PROVIDER_SITE_OTHER): Payer: Medicare PPO | Admitting: Vascular Surgery

## 2022-03-05 ENCOUNTER — Encounter (INDEPENDENT_AMBULATORY_CARE_PROVIDER_SITE_OTHER): Payer: Self-pay | Admitting: Vascular Surgery

## 2022-03-05 VITALS — BP 161/76 | HR 97 | Resp 16 | Wt 122.8 lb

## 2022-03-05 DIAGNOSIS — K551 Chronic vascular disorders of intestine: Secondary | ICD-10-CM

## 2022-03-05 DIAGNOSIS — I10 Essential (primary) hypertension: Secondary | ICD-10-CM | POA: Diagnosis not present

## 2022-03-05 DIAGNOSIS — I6523 Occlusion and stenosis of bilateral carotid arteries: Secondary | ICD-10-CM

## 2022-03-05 NOTE — Progress Notes (Signed)
MRN : [id number]  Jeremy Merritt is a 87 y.o. (1935-06-02) male who presents with chief complaint of  Chief Complaint  Patient presents with   Follow-up    1 yr follow up  .  History of Present Illness: Patient returns today in follow up of his vascular issues.  He has undergone SMA stent placement a few years ago for severe symptoms of chronic mesenteric ischemia with marked weight loss and poor functional status.  He has done very well since revascularization.  He is eating fine and gained weight.  He denies any recurrent significant abdominal pain.  He does have constipation issues. He also has some known carotid disease.  He is previously had a duplex and an angiogram which showed a right carotid occlusion and mild left carotid stenosis.  This was last checked about a year ago.  He has not had any focal neurologic symptoms since that time. Specifically, the patient denies amaurosis fugax, speech or swallowing difficulties, or arm or leg weakness or numbness   Current Outpatient Medications  Medication Sig Dispense Refill   aspirin EC 81 MG tablet Take 81 mg by mouth daily.     Blood Pressure Monitor DEVI Use as directed. 1 each 0   Cholecalciferol (VITAMIN D3) 50 MCG (2000 UT) TABS Take 2,000 Units by mouth daily. 90 tablet 3   clopidogrel (PLAVIX) 75 MG tablet Take 1 tablet (75 mg total) by mouth daily. 90 tablet 1   cyanocobalamin 1000 MCG tablet TAKE TWO TABLETS BY MOUTH EVERY DAY TO CORRECT LOW VITAMIN B12     levothyroxine (SYNTHROID) 100 MCG tablet Take 1 tablet (100 mcg total) by mouth daily before breakfast. 90 tablet 1   lisinopril-hydrochlorothiazide (ZESTORETIC) 20-12.5 MG tablet Take 1 tablet by mouth daily. 90 tablet 1   omeprazole (PRILOSEC) 40 MG capsule Take 1 capsule (40 mg total) by mouth 2 (two) times daily. 60 capsule 2   simvastatin (ZOCOR) 20 MG tablet Take 1 tablet (20 mg total) by mouth at bedtime. 90 tablet 1   traMADol (ULTRAM) 50 MG tablet Take 1  tablet (50 mg total) by mouth every 6 (six) hours as needed for moderate pain. 20 tablet 0   Vitamin D, Ergocalciferol, 50 MCG (2000 UT) CAPS 1 capsule     No current facility-administered medications for this visit.    Past Medical History:  Diagnosis Date   Allergy    Bilateral lower extremity edema 05/04/2019   Hyperlipidemia    Hypertension    Thyroid disease     Past Surgical History:  Procedure Laterality Date   CAROTID ANGIOGRAPHY Right 10/20/2020   Procedure: CAROTID ANGIOGRAPHY;  Surgeon: Annice Needy, MD;  Location: [facility] INVASIVE CV LAB;  Service: Cardiovascular;  Laterality: Right;   COLONOSCOPY WITH PROPOFOL N/A 04/28/2019   Procedure: COLONOSCOPY WITH PROPOFOL;  Surgeon: Toney Reil, MD;  Location: [facility] ENDOSCOPY;  Service: Gastroenterology;  Laterality: N/A;   ESOPHAGOGASTRODUODENOSCOPY (EGD) WITH PROPOFOL N/A 04/28/2019   Procedure: ESOPHAGOGASTRODUODENOSCOPY (EGD) WITH PROPOFOL;  Surgeon: Toney Reil, MD;  Location: [facility] ENDOSCOPY;  Service: Gastroenterology;  Laterality: N/A;   INGUINAL HERNIA REPAIR     VISCERAL ANGIOGRAPHY N/A 04/18/2019   Procedure: VISCERAL ANGIOGRAPHY;  Surgeon: Annice Needy, MD;  Location: [facility] INVASIVE CV LAB;  Service: Cardiovascular;  Laterality: N/A;     Social History   Tobacco Use   Smoking status: Never   Smokeless tobacco: Never  Vaping Use   Vaping Use: Never used  Substance Use Topics   Alcohol use: No   Drug use: No      Family History  Problem Relation Age of Onset   Other Mother        died of old age   Other Father        Black lung   Thyroid cancer Sister    Kidney failure Brother        on dialysis   Colon cancer Neg Hx    Liver cancer Neg Hx    Stomach cancer Neg Hx     No Known Allergies   REVIEW OF SYSTEMS (Negative unless checked)   Constitutional: [] Weight loss  [] Fever  [] Chills Cardiac: [] Chest pain   [] Chest pressure   [x] Palpitations   [] Shortness of breath when laying flat    [] Shortness of breath at rest   [x] Shortness of breath with exertion. Vascular:  [] Pain in legs with walking   [] Pain in legs at rest   [] Pain in legs when laying flat   [] Claudication   [] Pain in feet when walking  [] Pain in feet at rest  [] Pain in feet when laying flat   [] History of DVT   [] Phlebitis   [] Swelling in legs   [] Varicose veins   [] Non-healing ulcers Pulmonary:   [] Uses home oxygen   [] Productive cough   [] Hemoptysis   [] Wheeze  [] COPD   [] Asthma Neurologic:  [x] Dizziness  [] Blackouts   [] Seizures   [] History of stroke   [] History of TIA  [] Aphasia   [] Temporary blindness   [] Dysphagia   [] Weakness or numbness in arms   [] Weakness or numbness in legs Musculoskeletal:  [x] Arthritis   [] Joint swelling   [x] Joint pain   [] Low back pain Hematologic:  [] Easy bruising  [] Easy bleeding   [] Hypercoagulable state   [] Anemic  [] Hepatitis Gastrointestinal:  [] Blood in stool   [] Vomiting blood  [] Gastroesophageal reflux/heartburn   [] Abdominal pain Genitourinary:  [] Chronic kidney disease   [] Difficult urination  [] Frequent urination  [] Burning with urination   [] Hematuria Skin:  [] Rashes   [] Ulcers   [] Wounds Psychological:  [] History of anxiety   []  History of major depression.   Physical Examination  BP (!) 161/76 (BP Location: Left Arm)   Pulse 97   Resp 16   Wt 122 lb 12.8 oz (55.7 kg)   BMI 19.23 kg/m  Gen:  WD/WN, NAD Head: [place]/AT, No temporalis wasting. Ear/Nose/Throat: Hearing grossly intact, nares w/o erythema or drainage Eyes: Conjunctiva clear. Sclera non-icteric Neck: Supple.  Trachea midline.  Right carotid bruit Pulmonary:  Good air movement, no use of accessory muscles.  Cardiac: RRR, no JVD Vascular:  Vessel Right Left  Radial Palpable Palpable                                   Gastrointestinal: soft, non-tender/non-distended. No guarding/reflex.  Musculoskeletal: M/S 5/5 throughout.  No deformity or atrophy.  No significant lower extremity  edema. Neurologic: Sensation grossly intact in extremities.  Symmetrical.  Speech is fluent.  Psychiatric: Judgment intact, Mood & affect appropriate for pt's clinical situation. Dermatologic: No rashes or ulcers noted.  No cellulitis or open wounds.      Labs No results found for this or any previous visit (from the past 2160 hour(s)).  Radiology No results found.  Assessment/Plan Mesenteric ischemia (HCC) Doing far better after intervention. Needs an [place] rechecked at some point.  We will schedule at his convenience  Essential hypertension blood pressure control important in reducing the progression of atherosclerotic disease. On appropriate oral medications.  Occlusion and stenosis of bilateral carotid arteries Doing well and continues to eat well.  Does have some constipation intermittently.  His weight has been stable.  We need to get a mesenteric duplex in the near future at his convenience for follow-up as this has not been checked in over a year.   [name], MD  03/05/2022 10:12 AM    This note was created with Dragon medical transcription system.  Any errors from dictation are purely unintentional

## 2022-03-11 ENCOUNTER — Telehealth: Payer: Self-pay

## 2022-03-11 NOTE — Patient Outreach (Signed)
  Care Coordination   03/11/2022 Name: Jeremy Merritt MRN: [id number] DOB: 08-May-1935   Care Coordination Outreach Attempts:  A second unsuccessful outreach was attempted today to offer the patient with information about available care coordination services as a benefit of their health plan.     Follow Up Plan:  Additional outreach attempts will be made to offer the patient care coordination information and services.   Encounter Outcome:  No Answer  Care Coordination Interventions Activated:  No   Care Coordination Interventions:  No, not indicated    [facility] Management [phone]

## 2022-03-17 ENCOUNTER — Telehealth: Payer: Self-pay

## 2022-03-17 NOTE — Patient Outreach (Signed)
  Care Coordination   03/17/2022 Name: Jeremy Merritt MRN: [id number] DOB: 1934/08/25   Care Coordination Outreach Attempts:  A third unsuccessful outreach was attempted today to offer the patient with information about available care coordination services as a benefit of their health plan.   Follow Up Plan:  No further outreach attempts will be made at this time. We have been unable to contact the patient to offer or enroll patient in care coordination services  Encounter Outcome:  No Answer  Care Coordination Interventions Activated:  No   Care Coordination Interventions:  No, not indicated    [facility] Management [phone]

## 2022-04-16 ENCOUNTER — Encounter (INDEPENDENT_AMBULATORY_CARE_PROVIDER_SITE_OTHER): Payer: Medicare PPO

## 2022-04-16 ENCOUNTER — Ambulatory Visit (INDEPENDENT_AMBULATORY_CARE_PROVIDER_SITE_OTHER): Payer: Medicare PPO | Admitting: Vascular Surgery

## 2022-06-18 ENCOUNTER — Encounter (INDEPENDENT_AMBULATORY_CARE_PROVIDER_SITE_OTHER): Payer: Self-pay | Admitting: Vascular Surgery

## 2022-06-18 ENCOUNTER — Ambulatory Visit (INDEPENDENT_AMBULATORY_CARE_PROVIDER_SITE_OTHER): Payer: Medicare PPO | Admitting: Vascular Surgery

## 2022-06-18 ENCOUNTER — Ambulatory Visit (INDEPENDENT_AMBULATORY_CARE_PROVIDER_SITE_OTHER): Payer: Medicare PPO

## 2022-06-18 VITALS — BP 138/79 | HR 94 | Ht 67.0 in | Wt 122.0 lb

## 2022-06-18 DIAGNOSIS — I6523 Occlusion and stenosis of bilateral carotid arteries: Secondary | ICD-10-CM

## 2022-06-18 DIAGNOSIS — K551 Chronic vascular disorders of intestine: Secondary | ICD-10-CM

## 2022-06-18 DIAGNOSIS — I10 Essential (primary) hypertension: Secondary | ICD-10-CM | POA: Diagnosis not present

## 2022-06-18 DIAGNOSIS — E782 Mixed hyperlipidemia: Secondary | ICD-10-CM

## 2022-06-18 NOTE — Assessment & Plan Note (Signed)
Duplex today shows a chronic occlusion of the right internal carotid artery with stable 1 to 39% left ICA stenosis.  Continue current medical regimen as above.  Recheck in 1 year.

## 2022-06-18 NOTE — Assessment & Plan Note (Signed)
lipid control important in reducing the progression of atherosclerotic disease. Continue statin therapy  

## 2022-06-18 NOTE — Assessment & Plan Note (Signed)
blood pressure control important in reducing the progression of atherosclerotic disease. On appropriate oral medications.  

## 2022-06-18 NOTE — Assessment & Plan Note (Signed)
Duplex today shows relatively normal velocities in the visceral vessels with a patent SMA stent.  Doing well.  Continue aspirin, Plavix, and statin agent.  Recheck in 1 year.

## 2022-06-18 NOTE — Progress Notes (Signed)
MRN : [id number]  Jeremy Merritt is a 88 y.o. (January 27, 1935) male who presents with chief complaint of  Chief Complaint  Patient presents with   Follow-up    f/u with mesentric and carotid  .  History of Present Illness: Patient returns today in follow up of multiple vascular issues.  He is doing well today.  He continues to eat well and his weight has maintained.  He has a previous history of severe chronic mesenteric ischemia with SMA intervention in the past.  Duplex today shows relatively normal velocities in the visceral vessels with a patent SMA stent. He is also followed for carotid disease.  He has previously had an angiogram demonstrating a right carotid occlusion and mild left carotid stenosis of only 20 to 30%.  No focal neurologic symptoms since his last visit. Specifically, the patient denies amaurosis fugax, speech or swallowing difficulties, or arm or leg weakness or numbness. Duplex today shows a chronic occlusion of the right internal carotid artery with stable 1 to 39% left ICA stenosis.    Current Outpatient Medications  Medication Sig Dispense Refill   AMLODIPINE BESYLATE PO Take 10 mg by mouth daily at 6 (six) AM.     aspirin EC 81 MG tablet Take 81 mg by mouth daily.     CANDESARTAN CILEXETIL PO Take 16 mg by mouth daily at 6 (six) AM.     ferrous sulfate 325 (65 FE) MG tablet Take 325 mg by mouth as directed. Monday, Wednesday, Friday     levothyroxine (SYNTHROID) 100 MCG tablet Take 1 tablet (100 mcg total) by mouth daily before breakfast. 90 tablet 1   pravastatin (PRAVACHOL) 20 MG tablet Take 20 mg by mouth daily.     Cholecalciferol (VITAMIN D3) 50 MCG (2000 UT) TABS Take 2,000 Units by mouth daily. 90 tablet 3   clopidogrel (PLAVIX) 75 MG tablet Take 1 tablet (75 mg total) by mouth daily. 90 tablet 1   No current facility-administered medications for this visit.    Past Medical History:  Diagnosis Date   Allergy    Bilateral lower extremity edema  05/04/2019   Hyperlipidemia    Hypertension    Thyroid disease     Past Surgical History:  Procedure Laterality Date   CAROTID ANGIOGRAPHY Right 10/20/2020   Procedure: CAROTID ANGIOGRAPHY;  Surgeon: Algernon Huxley, MD;  Location: [place] [facility];  Service: Cardiovascular;  Laterality: Right;   COLONOSCOPY WITH PROPOFOL N/A 04/28/2019   Procedure: COLONOSCOPY WITH PROPOFOL;  Surgeon: Lin Landsman, MD;  Location: [facility] END[facility]COPY;  Service: Gastroenterology;  Laterality: N/A;   ESOPHAGOGASTRODUODENOSCOPY (EGD) WITH PROPOFOL N/A 04/28/2019   Procedure: ESOPHAGOGASTRODUODENOSCOPY (EGD) WITH PROPOFOL;  Surgeon: Lin Landsman, MD;  Location: [facility] END[facility]COPY;  Service: Gastroenterology;  Laterality: N/A;   INGUINAL HERNIA REPAIR     VISCERAL ANGIOGRAPHY N/A 04/18/2019   Procedure: VISCERAL ANGIOGRAPHY;  Surgeon: Algernon Huxley, MD;  Location: [place] [facility];  Service: Cardiovascular;  Laterality: N/A;     Social History   Tobacco Use   Smoking status: Never   Smokeless tobacco: Never  Vaping Use   Vaping Use: Never used  Substance Use Topics   Alcohol use: No   Drug use: No      Family History  Problem Relation Age of Onset   Other Mother        died of old age   Other Father        Black lung  Thyroid cancer Sister    Kidney failure Brother        on dialysis   Colon cancer Neg Hx    Liver cancer Neg Hx    Stomach cancer Neg Hx      No Known Allergies   REVIEW OF SYSTEMS (Negative unless checked)   Constitutional: [] Weight loss  [] Fever  [] Chills Cardiac: [] Chest pain   [] Chest pressure   [x] Palpitations   [] Shortness of breath when laying flat   [] Shortness of breath at rest   [x] Shortness of breath with exertion. Vascular:  [] Pain in legs with walking   [] Pain in legs at rest   [] Pain in legs when laying flat   [] Claudication   [] Pain in feet when walking  [] Pain in feet at rest  [] Pain in feet when laying flat   [] History of DVT   [] Phlebitis    [] Swelling in legs   [] Varicose veins   [] Non-healing ulcers Pulmonary:   [] Uses home oxygen   [] Productive cough   [] Hemoptysis   [] Wheeze  [] COPD   [] Asthma Neurologic:  [x] Dizziness  [] Blackouts   [] Seizures   [] History of stroke   [] History of TIA  [] Aphasia   [] Temporary blindness   [] Dysphagia   [] Weakness or numbness in arms   [] Weakness or numbness in legs Musculoskeletal:  [x] Arthritis   [] Joint swelling   [x] Joint pain   [] Low back pain Hematologic:  [] Easy bruising  [] Easy bleeding   [] Hypercoagulable state   [] Anemic  [] Hepatitis Gastrointestinal:  [] Blood in stool   [] Vomiting blood  [] Gastroesophageal reflux/heartburn   [] Abdominal pain Genitourinary:  [] Chronic kidney disease   [] Difficult urination  [] Frequent urination  [] Burning with urination   [] Hematuria Skin:  [] Rashes   [] Ulcers   [] Wounds Psychological:  [] History of anxiety   []  History of major depression.  Physical Examination  BP 138/79   Pulse 94   Ht 5\' 7"  (1.702 m)   Wt 122 lb (55.3 kg)   BMI 19.11 kg/m  Gen:  WD/WN, NAD Head: [place]/AT, No temporalis wasting. Ear/Nose/Throat: Hearing grossly intact, nares w/o erythema or drainage Eyes: Conjunctiva clear. Sclera non-icteric Neck: Supple.  Trachea midline. No bruit.  Pulmonary:  Good air movement, no use of accessory muscles.  Cardiac: RRR, no JVD Vascular:  Vessel Right Left  Radial Palpable Palpable                                   Gastrointestinal: soft, non-tender/non-distended. No guarding/reflex.  Musculoskeletal: M/S 5/5 throughout.  No deformity or atrophy. No edema. Neurologic: Sensation grossly intact in extremities.  Symmetrical.  Speech is fluent.  Psychiatric: Judgment intact, Mood & affect appropriate for pt's clinical situation. Dermatologic: No rashes or ulcers noted.  No cellulitis or open wounds.      Labs No results found for this or any previous visit (from the past 2160 hour(s)).  Radiology No results  found.  Assessment/Plan  Essential hypertension blood pressure control important in reducing the progression of atherosclerotic disease. On appropriate oral medications.   Chronic mesenteric ischemia (HCC) Duplex today shows relatively normal velocities in the visceral vessels with a patent SMA stent.  Doing well.  Continue aspirin, Plavix, and statin agent.  Recheck in 1 year.  Occlusion and stenosis of bilateral carotid arteries Duplex today shows a chronic occlusion of the right internal carotid artery with stable 1 to 39% left ICA stenosis.  Continue current medical regimen as  above.  Recheck in 1 year.  Mixed hyperlipidemia lipid control important in reducing the progression of atherosclerotic disease. Continue statin therapy     Leotis Pain, MD  06/18/2022 12:03 PM    This note was created with Dragon medical transcription system.  Any errors from dictation are purely unintentional

## 2023-06-21 ENCOUNTER — Other Ambulatory Visit (INDEPENDENT_AMBULATORY_CARE_PROVIDER_SITE_OTHER): Payer: Self-pay | Admitting: Vascular Surgery

## 2023-06-21 DIAGNOSIS — K551 Chronic vascular disorders of intestine: Secondary | ICD-10-CM

## 2023-06-21 DIAGNOSIS — I6523 Occlusion and stenosis of bilateral carotid arteries: Secondary | ICD-10-CM

## 2023-06-24 ENCOUNTER — Encounter (INDEPENDENT_AMBULATORY_CARE_PROVIDER_SITE_OTHER): Payer: Self-pay | Admitting: Vascular Surgery

## 2023-06-24 ENCOUNTER — Ambulatory Visit (INDEPENDENT_AMBULATORY_CARE_PROVIDER_SITE_OTHER): Payer: Medicare PPO

## 2023-06-24 ENCOUNTER — Ambulatory Visit (INDEPENDENT_AMBULATORY_CARE_PROVIDER_SITE_OTHER): Payer: Medicare PPO | Admitting: Vascular Surgery

## 2023-06-24 VITALS — BP 168/85 | HR 93 | Resp 18 | Ht 67.0 in | Wt 126.5 lb

## 2023-06-24 DIAGNOSIS — I6523 Occlusion and stenosis of bilateral carotid arteries: Secondary | ICD-10-CM

## 2023-06-24 DIAGNOSIS — E782 Mixed hyperlipidemia: Secondary | ICD-10-CM

## 2023-06-24 DIAGNOSIS — K551 Chronic vascular disorders of intestine: Secondary | ICD-10-CM | POA: Diagnosis not present

## 2023-06-24 DIAGNOSIS — I10 Essential (primary) hypertension: Secondary | ICD-10-CM | POA: Diagnosis not present

## 2023-06-24 NOTE — Assessment & Plan Note (Signed)
Carotid duplex today shows stable 1 to 39% left ICA stenosis with a known carotid occlusion.  No role for intervention.  Continue dual antiplatelet therapy and statin agent.  Recheck in 1 year.

## 2023-06-24 NOTE — Assessment & Plan Note (Signed)
blood pressure control important in reducing the progression of atherosclerotic disease. On appropriate oral medications.  

## 2023-06-24 NOTE — Progress Notes (Signed)
MRN : [id number]  Jeremy Merritt is a 89 y.o. (04-14-1935) male who presents with chief complaint of  Chief Complaint  Patient presents with   Follow-up     f/u with mesentric and carotid -  .  History of Present Illness: Patient returns today in follow up of multiple vascular issues.  Several years ago, he underwent superior mesenteric artery stenting for severe chronic mesenteric ischemia.  This resulted in marked improvement in his abdominal pain, weight loss, and difficulty eating.  He was able to gain weight and is adducted stable weight at this point.  No recurrent chronic visceral ischemic symptoms.  Duplex today shows a patent SMA stent with normal velocities in the SMA and celiac arteries on duplex today. He is also followed for carotid disease.  He has not had any focal neurologic deficits since his last visit. Specifically, the patient denies amaurosis fugax, speech or swallowing difficulties, or arm or leg weakness or numbness. Carotid duplex today shows stable 1 to 39% left ICA stenosis with a known carotid occlusion.   Current Outpatient Medications  Medication Sig Dispense Refill   AMLODIPINE BESYLATE PO Take 10 mg by mouth daily at 6 (six) AM.     aspirin EC 81 MG tablet Take 81 mg by mouth daily.     CANDESARTAN CILEXETIL PO Take 16 mg by mouth daily at 6 (six) AM.     Cholecalciferol (VITAMIN D3) 50 MCG (2000 UT) TABS Take 2,000 Units by mouth daily. 90 tablet 3   clopidogrel (PLAVIX) 75 MG tablet Take 1 tablet (75 mg total) by mouth daily. 90 tablet 1   ferrous sulfate 325 (65 FE) MG tablet Take 325 mg by mouth as directed. Monday, Wednesday, Friday     levothyroxine (SYNTHROID) 100 MCG tablet Take 1 tablet (100 mcg total) by mouth daily before breakfast. 90 tablet 1   pravastatin (PRAVACHOL) 20 MG tablet Take 20 mg by mouth daily.     No current facility-administered medications for this visit.    Past Medical History:  Diagnosis Date   Allergy     Bilateral lower extremity edema 05/04/2019   Hyperlipidemia    Hypertension    Thyroid disease     Past Surgical History:  Procedure Laterality Date   CAROTID ANGIOGRAPHY Right 10/20/2020   Procedure: CAROTID ANGIOGRAPHY;  Surgeon: Annice Needy, MD;  Location: [facility] INVASIVE [facility];  Service: Cardiovascular;  Laterality: Right;   COLONOSCOPY WITH PROPOFOL N/A 04/28/2019   Procedure: COLONOSCOPY WITH PROPOFOL;  Surgeon: Toney Reil, MD;  Location: [facility] ENDOSCOPY;  Service: Gastroenterology;  Laterality: N/A;   ESOPHAGOGASTRODUODENOSCOPY (EGD) WITH PROPOFOL N/A 04/28/2019   Procedure: ESOPHAGOGASTRODUODENOSCOPY (EGD) WITH PROPOFOL;  Surgeon: Toney Reil, MD;  Location: [facility] ENDOSCOPY;  Service: Gastroenterology;  Laterality: N/A;   INGUINAL HERNIA REPAIR     VISCERAL ANGIOGRAPHY N/A 04/18/2019   Procedure: VISCERAL ANGIOGRAPHY;  Surgeon: Annice Needy, MD;  Location: [facility] INVASIVE CV LAB;  Service: Cardiovascular;  Laterality: N/A;     Social History   Tobacco Use   Smoking status: Never   Smokeless tobacco: Never  Vaping Use   Vaping status: Never Used  Substance Use Topics   Alcohol use: No   Drug use: No      Family History  Problem Relation Age of Onset   Other Mother        died of old age   Other Father        Black lung  Thyroid cancer Sister    Kidney failure Brother        on dialysis   Colon cancer Neg Hx    Liver cancer Neg Hx    Stomach cancer Neg Hx      No Known Allergies   REVIEW OF SYSTEMS (Negative unless checked)   Constitutional: [] Weight loss  [] Fever  [] Chills Cardiac: [] Chest pain   [] Chest pressure   [x] Palpitations   [] Shortness of breath when laying flat   [] Shortness of breath at rest   [x] Shortness of breath with exertion. Vascular:  [] Pain in legs with walking   [] Pain in legs at rest   [] Pain in legs when laying flat   [] Claudication   [] Pain in feet when walking  [] Pain in feet at rest  [] Pain in feet when laying flat    [] History of DVT   [] Phlebitis   [] Swelling in legs   [] Varicose veins   [] Non-healing ulcers Pulmonary:   [] Uses home oxygen   [] Productive cough   [] Hemoptysis   [] Wheeze  [] COPD   [] Asthma Neurologic:  [x] Dizziness  [] Blackouts   [] Seizures   [] History of stroke   [] History of TIA  [] Aphasia   [] Temporary blindness   [] Dysphagia   [] Weakness or numbness in arms   [] Weakness or numbness in legs Musculoskeletal:  [x] Arthritis   [] Joint swelling   [x] Joint pain   [] Low back pain Hematologic:  [] Easy bruising  [] Easy bleeding   [] Hypercoagulable state   [] Anemic  [] Hepatitis Gastrointestinal:  [] Blood in stool   [] Vomiting blood  [] Gastroesophageal reflux/heartburn   [] Abdominal pain Genitourinary:  [] Chronic kidney disease   [] Difficult urination  [] Frequent urination  [] Burning with urination   [] Hematuria Skin:  [] Rashes   [] Ulcers   [] Wounds Psychological:  [] History of anxiety   []  History of major depression.    Physical Examination  BP (!) 168/85   Pulse 93   Resp 18   Ht 5\' 7"  (1.702 m)   Wt 126 lb 8 oz (57.4 kg)   BMI 19.81 kg/m  Gen:  WD/WN, NAD Head: [place]/AT, No temporalis wasting. Ear/Nose/Throat: Hearing grossly intact, nares w/o erythema or drainage Eyes: Conjunctiva clear. Sclera non-icteric Neck: Supple.  Trachea midline Pulmonary:  Good air movement, no use of accessory muscles.  Cardiac: RRR, no JVD Vascular:  Vessel Right Left  Radial Palpable Palpable                                   Gastrointestinal: soft, non-tender/non-distended. No guarding/reflex.  Musculoskeletal: M/S 5/5 throughout.  No deformity or atrophy. No edema. Neurologic: Sensation grossly intact in extremities.  Symmetrical.  Speech is fluent.  Psychiatric: Judgment intact, Mood & affect appropriate for pt's clinical situation. Dermatologic: No rashes or ulcers noted.  No cellulitis or open wounds.      Labs No results found for this or any previous visit (from the past 2160  hours).  Radiology No results found.  Assessment/Plan  Essential hypertension blood pressure control important in reducing the progression of atherosclerotic disease. On appropriate oral medications.   Mixed hyperlipidemia lipid control important in reducing the progression of atherosclerotic disease. Continue statin therapy   Chronic mesenteric ischemia (HCC) Duplex today shows a patent SMA stent with normal velocities in the SMA and celiac arteries on duplex today.  Symptoms are resolved after intervention.  Continue dual antiplatelet therapy and statin agent.  Recheck in 1 year.  Occlusion and  stenosis of bilateral carotid arteries Carotid duplex today shows stable 1 to 39% left ICA stenosis with a known carotid occlusion.  No role for intervention.  Continue dual antiplatelet therapy and statin agent.  Recheck in 1 year.    [name], MD  06/24/2023 10:53 AM    This note was created with Dragon medical transcription system.  Any errors from dictation are purely unintentional

## 2023-06-24 NOTE — Assessment & Plan Note (Signed)
lipid control important in reducing the progression of atherosclerotic disease. Continue statin therapy  

## 2023-06-24 NOTE — Assessment & Plan Note (Signed)
Duplex today shows a patent SMA stent with normal velocities in the SMA and celiac arteries on duplex today.  Symptoms are resolved after intervention.  Continue dual antiplatelet therapy and statin agent.  Recheck in 1 year.

## 2024-06-26 ENCOUNTER — Other Ambulatory Visit (INDEPENDENT_AMBULATORY_CARE_PROVIDER_SITE_OTHER): Payer: Self-pay | Admitting: Vascular Surgery

## 2024-06-26 DIAGNOSIS — I6521 Occlusion and stenosis of right carotid artery: Secondary | ICD-10-CM

## 2024-06-26 DIAGNOSIS — K551 Chronic vascular disorders of intestine: Secondary | ICD-10-CM

## 2024-06-29 ENCOUNTER — Ambulatory Visit (INDEPENDENT_AMBULATORY_CARE_PROVIDER_SITE_OTHER): Payer: Medicare PPO | Admitting: Vascular Surgery

## 2024-06-29 ENCOUNTER — Ambulatory Visit (INDEPENDENT_AMBULATORY_CARE_PROVIDER_SITE_OTHER): Payer: Medicare PPO

## 2024-06-29 DIAGNOSIS — I6521 Occlusion and stenosis of right carotid artery: Secondary | ICD-10-CM | POA: Diagnosis not present

## 2024-06-29 DIAGNOSIS — K551 Chronic vascular disorders of intestine: Secondary | ICD-10-CM

## 2024-07-13 ENCOUNTER — Encounter (INDEPENDENT_AMBULATORY_CARE_PROVIDER_SITE_OTHER): Payer: Self-pay | Admitting: Vascular Surgery

## 2024-07-13 ENCOUNTER — Ambulatory Visit (INDEPENDENT_AMBULATORY_CARE_PROVIDER_SITE_OTHER): Admitting: Vascular Surgery

## 2024-07-13 VITALS — BP 160/82 | HR 120 | Resp 17 | Ht 67.0 in | Wt 119.2 lb

## 2024-07-13 DIAGNOSIS — I10 Essential (primary) hypertension: Secondary | ICD-10-CM

## 2024-07-13 DIAGNOSIS — E782 Mixed hyperlipidemia: Secondary | ICD-10-CM

## 2024-07-13 DIAGNOSIS — I6523 Occlusion and stenosis of bilateral carotid arteries: Secondary | ICD-10-CM

## 2024-07-13 DIAGNOSIS — K551 Chronic vascular disorders of intestine: Secondary | ICD-10-CM

## 2024-07-13 NOTE — Progress Notes (Signed)
 "   MRN : [id number]  Jeremy Merritt is a 89 y.o. ([birth year]) male who presents with chief complaint of  Chief Complaint  Patient presents with   Follow-up    F/u 1 year  .  History of Present Illness: Patient returns today in follow up of multiple vascular issues  Discussed the use of AI scribe software for clinical note transcription with the patient, who gave verbal consent to proceed.  History of Present Illness Jeremy Merritt is an 89 year old male with chronic mesenteric ischemia status post superior mesenteric artery stenting and bilateral carotid artery occlusion and stenosis who presents for routine vascular surgery follow-up.  He previously had severe mesenteric ischemia with marked weight loss, poor oral intake, and wheelchair dependence, which improved significantly after SMA stenting several years ago. He has remained clinically stable since the intervention.  Last week he had a surveillance carotid duplex. He has chronic right carotid artery occlusion and mild disease in the left carotid artery.    Results Diagnostic Carotid duplex ultrasound (07/06/2024): Right carotid artery chronically occluded. Left carotid artery with mild atherosclerotic disease. Stent patent. No interval change. Mesenteric duplex 06/2024, SMA stent is patent although there are some elevated velocities which are fairly stable.  Current Outpatient Medications  Medication Sig Dispense Refill   AMLODIPINE BESYLATE PO Take 10 mg by mouth daily at 6 (six) AM.     aspirin  EC 81 MG tablet Take 81 mg by mouth daily.     CANDESARTAN CILEXETIL PO Take 16 mg by mouth daily at 6 (six) AM.     clopidogrel  (PLAVIX ) 75 MG tablet Take 1 tablet (75 mg total) by mouth daily. 90 tablet 1   ferrous sulfate 325 (65 FE) MG tablet Take 325 mg by mouth as directed. Monday, Wednesday, Friday     levothyroxine  (SYNTHROID ) 100 MCG tablet Take 1 tablet (100 mcg total) by mouth daily before breakfast. 90 tablet  1   pravastatin (PRAVACHOL) 20 MG tablet Take 20 mg by mouth daily.     Cholecalciferol  (VITAMIN D3) 50 MCG (2000 UT) TABS Take 2,000 Units by mouth daily. (Patient not taking: Reported on 07/13/2024) 90 tablet 3   No current facility-administered medications for this visit.    Past Medical History:  Diagnosis Date   Allergy    Bilateral lower extremity edema 05/04/2019   Hyperlipidemia    Hypertension    Thyroid  disease     Past Surgical History:  Procedure Laterality Date   CAROTID ANGIOGRAPHY Right 10/20/2020   Procedure: CAROTID ANGIOGRAPHY;  Surgeon: Marea Selinda RAMAN, MD;  Location: [facility];  Service: Cardiovascular;  Laterality: Right;   COLONOSCOPY WITH PROPOFOL  N/A 04/28/2019   Procedure: COLONOSCOPY WITH PROPOFOL ;  Surgeon: Unk Corinn Skiff, MD;  Location: [facility];  Service: Gastroenterology;  Laterality: N/A;   ESOPHAGOGASTRODUODENOSCOPY (EGD) WITH PROPOFOL  N/A 04/28/2019   Procedure: ESOPHAGOGASTRODUODENOSCOPY (EGD) WITH PROPOFOL ;  Surgeon: Unk Corinn Skiff, MD;  Location: [facility];  Service: Gastroenterology;  Laterality: N/A;   INGUINAL HERNIA REPAIR     VISCERAL ANGIOGRAPHY N/A 04/18/2019   Procedure: VISCERAL ANGIOGRAPHY;  Surgeon: Marea Selinda RAMAN, MD;  Location: [facility];  Service: Cardiovascular;  Laterality: N/A;     Social History[1]    Family History  Problem Relation Age of Onset   Other Mother        died of old age   Other Father        Black lung   Thyroid  cancer Sister  Kidney failure Brother        on dialysis   Colon cancer Neg Hx    Liver cancer Neg Hx    Stomach cancer Neg Hx      Allergies[2]   REVIEW OF SYSTEMS (Negative unless checked)   Constitutional: [] Weight loss  [] Fever  [] Chills Cardiac: [] Chest pain   [] Chest pressure   [x] Palpitations   [] Shortness of breath when laying flat   [] Shortness of breath at rest   [x] Shortness of breath with exertion. Vascular:  [] Pain in legs with walking    [] Pain in legs at rest   [] Pain in legs when laying flat   [] Claudication   [] Pain in feet when walking  [] Pain in feet at rest  [] Pain in feet when laying flat   [] History of DVT   [] Phlebitis   [] Swelling in legs   [] Varicose veins   [] Non-healing ulcers Pulmonary:   [] Uses home oxygen   [] Productive cough   [] Hemoptysis   [] Wheeze  [] COPD   [] Asthma Neurologic:  [x] Dizziness  [] Blackouts   [] Seizures   [] History of stroke   [] History of TIA  [] Aphasia   [] Temporary blindness   [] Dysphagia   [] Weakness or numbness in arms   [] Weakness or numbness in legs Musculoskeletal:  [x] Arthritis   [] Joint swelling   [x] Joint pain   [] Low back pain Hematologic:  [] Easy bruising  [] Easy bleeding   [] Hypercoagulable state   [] Anemic  [] Hepatitis Gastrointestinal:  [] Blood in stool   [] Vomiting blood  [] Gastroesophageal reflux/heartburn   [] Abdominal pain Genitourinary:  [] Chronic kidney disease   [] Difficult urination  [] Frequent urination  [] Burning with urination   [] Hematuria Skin:  [] Rashes   [] Ulcers   [] Wounds Psychological:  [] History of anxiety   []  History of major depression.  Physical Examination  BP (!) 160/82   Pulse (!) 120   Resp 17   Ht 5' 7 (1.702 m)   Wt 119 lb 3.2 oz (54.1 kg)   BMI 18.67 kg/m  Gen:  WD/WN, NAD Head: [place]/AT, No temporalis wasting. Ear/Nose/Throat: Hearing grossly intact, nares w/o erythema or drainage Eyes: Conjunctiva clear. Sclera non-icteric Neck: Supple.  Trachea midline Pulmonary:  Good air movement, no use of accessory muscles.  Cardiac: RRR, no JVD Vascular:  Vessel Right Left  Radial Palpable Palpable                                   Gastrointestinal: soft, non-tender/non-distended. No guarding/reflex.  Musculoskeletal: M/S 5/5 throughout.  No deformity or atrophy. No edema. Neurologic: Sensation grossly intact in extremities.  Symmetrical.  Speech is fluent.  Psychiatric: Judgment intact, Mood & affect appropriate for pt's clinical  situation. Dermatologic: No rashes or ulcers noted.  No cellulitis or open wounds.  Physical Exam     Labs No results found for this or any previous visit (from the past 2160 hours).  Radiology VAS US  CAROTID Result Date: 07/03/2024 Carotid Arterial Duplex Study Patient Name:  DESMEN SCHOFFSTALL [facility]  Date of Exam:   06/29/2024 Medical Rec #: [id number]             Accession #:    [id number] Date of Birth: 1935-03-03            Patient Gender: M Patient Age:   16 years Exam Location:  [facility] Procedure:      VAS US  CAROTID Referring Phys: SELINDA GU --------------------------------------------------------------------------------  Indications:  Carotid  artery disease and Rt ICA occlusion. Risk Factors: Hypertension, hyperlipidemia, no history of smoking, prior CVA. Performing Technologist: Donnice Charnley RVT  Examination Guidelines: A complete evaluation includes B-mode imaging, spectral Doppler, color Doppler, and power Doppler as needed of all accessible portions of each vessel. Bilateral testing is considered an integral part of a complete examination. Limited examinations for reoccurring indications may be performed as noted.  Right Carotid Findings: +----------+--------+--------+--------+------------------+--------+           PSV cm/sEDV cm/sStenosisPlaque DescriptionComments +----------+--------+--------+--------+------------------+--------+ CCA Prox  57      0                                          +----------+--------+--------+--------+------------------+--------+ CCA Mid   69      0                                          +----------+--------+--------+--------+------------------+--------+ CCA Distal65      0       <50%    calcific and focal         +----------+--------+--------+--------+------------------+--------+ ICA Prox  0               Occluded                           +----------+--------+--------+--------+------------------+--------+ ICA  Mid   0               Occluded                           +----------+--------+--------+--------+------------------+--------+ ICA Distal0               Occluded                           +----------+--------+--------+--------+------------------+--------+ ECA       120     0                                          +----------+--------+--------+--------+------------------+--------+ +----------+--------+-------+----------------+-------------------+           PSV cm/sEDV cmsDescribe        Arm Pressure (mmHG) +----------+--------+-------+----------------+-------------------+ Subclavian171     0      Multiphasic, WNL                    +----------+--------+-------+----------------+-------------------+ +---------+--------+--+--------+--+---------+ VertebralPSV cm/s85EDV cm/s24Antegrade +---------+--------+--+--------+--+---------+  Left Carotid Findings: +----------+--------+--------+--------+-------------------------+--------+           PSV cm/sEDV cm/sStenosisPlaque Description       Comments +----------+--------+--------+--------+-------------------------+--------+ CCA Prox  110     19                                                +----------+--------+--------+--------+-------------------------+--------+ CCA Mid   112     23                                                +----------+--------+--------+--------+-------------------------+--------+  CCA Distal107     23      <50%    calcific and heterogenous         +----------+--------+--------+--------+-------------------------+--------+ ICA Prox  170     27      1-39%   calcific                          +----------+--------+--------+--------+-------------------------+--------+ ICA Mid   171     38                                                +----------+--------+--------+--------+-------------------------+--------+ ICA Distal144     32                                                 +----------+--------+--------+--------+-------------------------+--------+ ECA       213     0                                                 +----------+--------+--------+--------+-------------------------+--------+ +----------+--------+--------+----------------+-------------------+           PSV cm/sEDV cm/sDescribe        Arm Pressure (mmHG) +----------+--------+--------+----------------+-------------------+ [id number]             Multiphasic, WNL                    +----------+--------+--------+----------------+-------------------+ +---------+--------+--+--------+----------+ VertebralPSV cm/s24EDV cm/sRetrograde +---------+--------+--+--------+----------+   Summary: Right Carotid: Evidence consistent with a total occlusion of the right ICA.                Non-hemodynamically significant plaque <50% noted in the CCA. Left Carotid: Velocities in the left ICA are consistent with a 1-39% stenosis. Vertebrals:  Right vertebral artery demonstrates antegrade flow. Left vertebral              artery demonstrates retrograde flow. Subclavians: Normal flow hemodynamics were seen in bilateral subclavian              arteries. *See table(s) above for measurements and observations.  Electronically signed by Selinda Gu MD on 07/03/2024 at 8:45:42 AM.    Final    VAS US  MESENTERIC Result Date: 07/03/2024 ABDOMINAL VISCERAL Patient Name:  ARLESTER KEEHAN [facility]  Date of Exam:   06/29/2024 Medical Rec #: [id number]             Accession #:    [id number] Date of Birth: [birth year]            Patient Gender: M Patient Age:   89 years Exam Location:  [facility] Procedure:      VAS US  MESENTERIC Referring Phys: [id number] SELINDA RAMAN Aleecia Tapia -------------------------------------------------------------------------------- Indications: Follow up mesenteric stent High Risk Factors: Hypertension, hyperlipidemia, no history of smoking, prior                    CVA. Vascular Interventions: 04/18/2019 SMA PTA and  stent placement. Limitations: Air/bowel gas. Performing Technologist: Donnice Charnley RVT  Examination Guidelines: A complete evaluation includes B-mode imaging, spectral Doppler, color Doppler, and power Doppler as needed  of all accessible portions of each vessel. Bilateral testing is considered an integral part of a complete examination. Limited examinations for reoccurring indications may be performed as noted.  Duplex Findings: +--------------------+--------+--------+------+--------+ Mesenteric          PSV cm/sEDV cm/sPlaqueComments +--------------------+--------+--------+------+--------+ Aorta Mid             102                          +--------------------+--------+--------+------+--------+ Celiac Artery Origin  378                          +--------------------+--------+--------+------+--------+ SMA Origin            320      44          stent   +--------------------+--------+--------+------+--------+ SMA Proximal          314      44          stent   +--------------------+--------+--------+------+--------+ SMA Mid               327      47                  +--------------------+--------+--------+------+--------+ CHA                   139                          +--------------------+--------+--------+------+--------+ Splenic               115      29                  +--------------------+--------+--------+------+--------+    Summary: Largest Aortic Diameter: 2.3 cm  Mesenteric: 70 to 99% stenosis in the superior mesenteric artery and celiac artery.  *See table(s) above for measurements and observations.  Diagnosing physician: Selinda Gu MD  Electronically signed by Selinda Gu MD on 07/03/2024 at 8:45:30 AM.    Final     Assessment/Plan Assessment & Plan Chronic mesenteric ischemia Severe chronic mesenteric ischemia previously treated with vascular intervention. Currently with ongoing risk of recurrence. Continue DAPT.  Recent duplex shows mildly elevated  velocities in the SMA stent.  No current worrisome symptoms. - Continue annual follow-up for surveillance of mesenteric ischemia. - Instructed him to report immediately if he develops postprandial abdominal pain, weight loss, or claudication.  Occlusion and stenosis of bilateral carotid arteries Chronic right carotid artery occlusion and mild left carotid artery disease with stable findings on recent ultrasound. No intervention needed currently. - Continue annual carotid ultrasound surveillance. On DAPT   Essential hypertension blood pressure control important in reducing the progression of atherosclerotic disease. On appropriate oral medications.     Mixed hyperlipidemia lipid control important in reducing the progression of atherosclerotic disease. Continue statin therapy  Selinda Gu, MD  07/13/2024 9:35 AM    This note was created with Dragon medical transcription system.  Any errors from dictation are purely unintentional     [1]  Social History Tobacco Use   Smoking status: Never   Smokeless tobacco: Never  Vaping Use   Vaping status: Never Used  Substance Use Topics   Alcohol use: No   Drug use: No  [2] No Known Allergies  "

## 2025-08-20 ENCOUNTER — Ambulatory Visit (INDEPENDENT_AMBULATORY_CARE_PROVIDER_SITE_OTHER): Admitting: Vascular Surgery

## 2025-08-20 ENCOUNTER — Encounter (INDEPENDENT_AMBULATORY_CARE_PROVIDER_SITE_OTHER)
